# Patient Record
Sex: Female | Born: 1935 | Race: White | Hispanic: No | Marital: Single | Smoking: Never smoker
Health system: Southern US, Community
[De-identification: ages and names within clinical notes are randomized; demographics above are authoritative.]

## PROBLEM LIST (undated history)

## (undated) DIAGNOSIS — E039 Hypothyroidism, unspecified: Secondary | ICD-10-CM

## (undated) DIAGNOSIS — R269 Unspecified abnormalities of gait and mobility: Secondary | ICD-10-CM

## (undated) DIAGNOSIS — F341 Dysthymic disorder: Secondary | ICD-10-CM

## (undated) DIAGNOSIS — S72143A Displaced intertrochanteric fracture of unspecified femur, initial encounter for closed fracture: Secondary | ICD-10-CM

## (undated) DIAGNOSIS — I447 Left bundle-branch block, unspecified: Secondary | ICD-10-CM

## (undated) DIAGNOSIS — I10 Essential (primary) hypertension: Principal | ICD-10-CM

## (undated) DIAGNOSIS — N390 Urinary tract infection, site not specified: Secondary | ICD-10-CM

## (undated) DIAGNOSIS — F1010 Alcohol abuse, uncomplicated: Secondary | ICD-10-CM

## (undated) DIAGNOSIS — M1710 Unilateral primary osteoarthritis, unspecified knee: Secondary | ICD-10-CM

## (undated) DIAGNOSIS — E785 Hyperlipidemia, unspecified: Secondary | ICD-10-CM

## (undated) DIAGNOSIS — E079 Disorder of thyroid, unspecified: Secondary | ICD-10-CM

## (undated) DIAGNOSIS — D62 Acute posthemorrhagic anemia: Secondary | ICD-10-CM

## (undated) DIAGNOSIS — M810 Age-related osteoporosis without current pathological fracture: Secondary | ICD-10-CM

## (undated) DIAGNOSIS — I509 Heart failure, unspecified: Secondary | ICD-10-CM

## (undated) DIAGNOSIS — K829 Disease of gallbladder, unspecified: Secondary | ICD-10-CM

## (undated) HISTORY — DX: Acute posthemorrhagic anemia: D62

## (undated) HISTORY — DX: Heart failure, unspecified: I50.9

## (undated) HISTORY — DX: Unilateral primary osteoarthritis, unspecified knee: M17.10

## (undated) HISTORY — DX: Unspecified abnormalities of gait and mobility: R26.9

## (undated) HISTORY — DX: Hypothyroidism, unspecified: E03.9

## (undated) HISTORY — PX: CHOLECYSTECTOMY: SHX55

## (undated) HISTORY — DX: Urinary tract infection, site not specified: N39.0

## (undated) HISTORY — DX: Alcohol abuse, uncomplicated: F10.10

## (undated) HISTORY — DX: Dysthymic disorder: F34.1

## (undated) HISTORY — DX: Left bundle-branch block, unspecified: I44.7

## (undated) HISTORY — DX: Age-related osteoporosis without current pathological fracture: M81.0

## (undated) HISTORY — DX: Disease of gallbladder, unspecified: K82.9

## (undated) HISTORY — DX: Essential (primary) hypertension: I10

## (undated) HISTORY — DX: Displaced intertrochanteric fracture of unspecified femur, initial encounter for closed fracture: S72.143A

---

## 1997-05-09 ENCOUNTER — Encounter: Admission: RE | Admit: 1997-05-09 | Discharge: 1997-05-09 | Payer: Self-pay | Admitting: Internal Medicine

## 1997-05-24 ENCOUNTER — Encounter: Admission: RE | Admit: 1997-05-24 | Discharge: 1997-05-24 | Payer: Self-pay | Admitting: Hematology and Oncology

## 1997-05-30 ENCOUNTER — Encounter: Admission: RE | Admit: 1997-05-30 | Discharge: 1997-05-30 | Payer: Self-pay | Admitting: Internal Medicine

## 1997-06-02 ENCOUNTER — Encounter: Admission: RE | Admit: 1997-06-02 | Discharge: 1997-06-02 | Payer: Self-pay | Admitting: Internal Medicine

## 1998-06-05 ENCOUNTER — Encounter: Admission: RE | Admit: 1998-06-05 | Discharge: 1998-06-05 | Payer: Self-pay | Admitting: Internal Medicine

## 1999-03-29 ENCOUNTER — Encounter (INDEPENDENT_AMBULATORY_CARE_PROVIDER_SITE_OTHER): Payer: Self-pay

## 1999-03-29 ENCOUNTER — Encounter: Payer: Self-pay | Admitting: General Surgery

## 1999-03-29 ENCOUNTER — Inpatient Hospital Stay (HOSPITAL_COMMUNITY): Admission: EM | Admit: 1999-03-29 | Discharge: 1999-04-02 | Payer: Self-pay

## 1999-03-30 ENCOUNTER — Encounter: Payer: Self-pay | Admitting: General Surgery

## 1999-04-01 ENCOUNTER — Encounter: Payer: Self-pay | Admitting: General Surgery

## 1999-07-16 ENCOUNTER — Encounter: Admission: RE | Admit: 1999-07-16 | Discharge: 1999-07-16 | Payer: Self-pay | Admitting: Internal Medicine

## 1999-11-19 ENCOUNTER — Encounter: Admission: RE | Admit: 1999-11-19 | Discharge: 1999-11-19 | Payer: Self-pay | Admitting: Hematology and Oncology

## 2000-02-25 ENCOUNTER — Encounter: Admission: RE | Admit: 2000-02-25 | Discharge: 2000-02-25 | Payer: Self-pay | Admitting: Internal Medicine

## 2000-03-10 ENCOUNTER — Ambulatory Visit (HOSPITAL_COMMUNITY): Admission: RE | Admit: 2000-03-10 | Discharge: 2000-03-10 | Payer: Self-pay | Admitting: Internal Medicine

## 2000-03-10 ENCOUNTER — Encounter: Admission: RE | Admit: 2000-03-10 | Discharge: 2000-03-10 | Payer: Self-pay | Admitting: Internal Medicine

## 2000-04-24 ENCOUNTER — Encounter: Payer: Self-pay | Admitting: Sports Medicine

## 2000-04-24 ENCOUNTER — Encounter: Admission: RE | Admit: 2000-04-24 | Discharge: 2000-04-24 | Payer: Self-pay | Admitting: Sports Medicine

## 2000-06-17 ENCOUNTER — Ambulatory Visit (HOSPITAL_BASED_OUTPATIENT_CLINIC_OR_DEPARTMENT_OTHER): Admission: RE | Admit: 2000-06-17 | Discharge: 2000-06-17 | Payer: Self-pay | Admitting: General Surgery

## 2000-08-27 ENCOUNTER — Encounter: Admission: RE | Admit: 2000-08-27 | Discharge: 2000-08-27 | Payer: Self-pay | Admitting: Internal Medicine

## 2000-12-02 ENCOUNTER — Other Ambulatory Visit: Admission: RE | Admit: 2000-12-02 | Discharge: 2000-12-02 | Payer: Self-pay | Admitting: Internal Medicine

## 2000-12-07 ENCOUNTER — Encounter: Admission: RE | Admit: 2000-12-07 | Discharge: 2000-12-07 | Payer: Self-pay | Admitting: Internal Medicine

## 2000-12-07 ENCOUNTER — Encounter: Payer: Self-pay | Admitting: Internal Medicine

## 2001-05-31 ENCOUNTER — Encounter: Admission: RE | Admit: 2001-05-31 | Discharge: 2001-05-31 | Payer: Self-pay

## 2001-11-19 ENCOUNTER — Encounter: Admission: RE | Admit: 2001-11-19 | Discharge: 2001-11-19 | Payer: Self-pay | Admitting: Internal Medicine

## 2002-03-30 ENCOUNTER — Encounter: Admission: RE | Admit: 2002-03-30 | Discharge: 2002-03-30 | Payer: Self-pay | Admitting: Internal Medicine

## 2002-04-25 ENCOUNTER — Encounter: Payer: Self-pay | Admitting: Sports Medicine

## 2002-04-25 ENCOUNTER — Encounter: Admission: RE | Admit: 2002-04-25 | Discharge: 2002-04-25 | Payer: Self-pay | Admitting: Sports Medicine

## 2002-05-17 ENCOUNTER — Encounter: Admission: RE | Admit: 2002-05-17 | Discharge: 2002-05-17 | Payer: Self-pay | Admitting: Infectious Diseases

## 2002-06-27 ENCOUNTER — Emergency Department (HOSPITAL_COMMUNITY): Admission: EM | Admit: 2002-06-27 | Discharge: 2002-06-27 | Payer: Self-pay | Admitting: Emergency Medicine

## 2003-05-11 ENCOUNTER — Encounter: Admission: RE | Admit: 2003-05-11 | Discharge: 2003-05-11 | Payer: Self-pay | Admitting: *Deleted

## 2005-05-05 ENCOUNTER — Encounter: Admission: RE | Admit: 2005-05-05 | Discharge: 2005-05-05 | Payer: Self-pay | Admitting: Family Medicine

## 2006-06-18 ENCOUNTER — Encounter: Admission: RE | Admit: 2006-06-18 | Discharge: 2006-06-18 | Payer: Self-pay | Admitting: Gastroenterology

## 2006-12-03 ENCOUNTER — Other Ambulatory Visit: Admission: RE | Admit: 2006-12-03 | Discharge: 2006-12-03 | Payer: Self-pay | Admitting: Obstetrics & Gynecology

## 2008-02-01 ENCOUNTER — Encounter: Admission: RE | Admit: 2008-02-01 | Discharge: 2008-02-01 | Payer: Self-pay | Admitting: Family Medicine

## 2008-04-16 ENCOUNTER — Inpatient Hospital Stay (HOSPITAL_COMMUNITY): Admission: EM | Admit: 2008-04-16 | Discharge: 2008-04-21 | Payer: Self-pay | Admitting: Emergency Medicine

## 2008-04-21 ENCOUNTER — Encounter: Payer: Self-pay | Admitting: Family Medicine

## 2008-04-21 DIAGNOSIS — I10 Essential (primary) hypertension: Secondary | ICD-10-CM

## 2008-04-21 DIAGNOSIS — R109 Unspecified abdominal pain: Secondary | ICD-10-CM

## 2008-04-21 DIAGNOSIS — E039 Hypothyroidism, unspecified: Secondary | ICD-10-CM

## 2008-04-21 DIAGNOSIS — M810 Age-related osteoporosis without current pathological fracture: Secondary | ICD-10-CM

## 2008-04-21 DIAGNOSIS — R630 Anorexia: Secondary | ICD-10-CM | POA: Insufficient documentation

## 2008-04-21 DIAGNOSIS — IMO0002 Reserved for concepts with insufficient information to code with codable children: Secondary | ICD-10-CM

## 2008-04-21 DIAGNOSIS — R269 Unspecified abnormalities of gait and mobility: Secondary | ICD-10-CM

## 2008-04-21 DIAGNOSIS — N390 Urinary tract infection, site not specified: Secondary | ICD-10-CM | POA: Insufficient documentation

## 2008-04-21 DIAGNOSIS — S72143A Displaced intertrochanteric fracture of unspecified femur, initial encounter for closed fracture: Secondary | ICD-10-CM

## 2008-04-21 DIAGNOSIS — F1010 Alcohol abuse, uncomplicated: Secondary | ICD-10-CM | POA: Insufficient documentation

## 2008-04-21 DIAGNOSIS — D62 Acute posthemorrhagic anemia: Secondary | ICD-10-CM

## 2008-04-21 DIAGNOSIS — F329 Major depressive disorder, single episode, unspecified: Secondary | ICD-10-CM

## 2008-04-21 DIAGNOSIS — F32A Depression, unspecified: Secondary | ICD-10-CM | POA: Insufficient documentation

## 2008-04-21 DIAGNOSIS — F341 Dysthymic disorder: Secondary | ICD-10-CM

## 2008-04-21 DIAGNOSIS — M1710 Unilateral primary osteoarthritis, unspecified knee: Secondary | ICD-10-CM

## 2008-04-21 HISTORY — DX: Reserved for concepts with insufficient information to code with codable children: IMO0002

## 2008-04-21 HISTORY — DX: Urinary tract infection, site not specified: N39.0

## 2008-04-21 HISTORY — DX: Dysthymic disorder: F34.1

## 2008-04-21 HISTORY — DX: Displaced intertrochanteric fracture of unspecified femur, initial encounter for closed fracture: S72.143A

## 2008-04-21 HISTORY — DX: Age-related osteoporosis without current pathological fracture: M81.0

## 2008-04-21 HISTORY — DX: Acute posthemorrhagic anemia: D62

## 2008-04-21 HISTORY — DX: Unspecified abnormalities of gait and mobility: R26.9

## 2008-04-21 HISTORY — DX: Essential (primary) hypertension: I10

## 2008-04-21 HISTORY — DX: Hypothyroidism, unspecified: E03.9

## 2008-04-21 HISTORY — DX: Alcohol abuse, uncomplicated: F10.10

## 2008-05-08 ENCOUNTER — Encounter: Payer: Self-pay | Admitting: Family Medicine

## 2008-05-22 DIAGNOSIS — K829 Disease of gallbladder, unspecified: Secondary | ICD-10-CM

## 2008-05-22 DIAGNOSIS — I10 Essential (primary) hypertension: Secondary | ICD-10-CM | POA: Insufficient documentation

## 2008-05-22 HISTORY — DX: Disease of gallbladder, unspecified: K82.9

## 2008-05-23 ENCOUNTER — Encounter: Payer: Self-pay | Admitting: Cardiovascular Disease

## 2008-05-23 ENCOUNTER — Ambulatory Visit: Payer: Self-pay | Admitting: Cardiovascular Disease

## 2008-05-23 DIAGNOSIS — R609 Edema, unspecified: Secondary | ICD-10-CM

## 2008-05-23 DIAGNOSIS — I447 Left bundle-branch block, unspecified: Secondary | ICD-10-CM

## 2008-05-23 HISTORY — DX: Left bundle-branch block, unspecified: I44.7

## 2008-06-20 ENCOUNTER — Encounter: Payer: Self-pay | Admitting: Cardiovascular Disease

## 2008-06-20 ENCOUNTER — Ambulatory Visit: Payer: Self-pay

## 2008-06-28 ENCOUNTER — Encounter (INDEPENDENT_AMBULATORY_CARE_PROVIDER_SITE_OTHER): Payer: Self-pay | Admitting: *Deleted

## 2010-05-16 LAB — URINALYSIS, ROUTINE W REFLEX MICROSCOPIC
Bilirubin Urine: NEGATIVE
Glucose, UA: NEGATIVE mg/dL
Specific Gravity, Urine: 1.012 (ref 1.005–1.030)
Urobilinogen, UA: 0.2 mg/dL (ref 0.0–1.0)

## 2010-05-16 LAB — CBC
HCT: 25.1 % — ABNORMAL LOW (ref 36.0–46.0)
HCT: 25.7 % — ABNORMAL LOW (ref 36.0–46.0)
Hemoglobin: 10.8 g/dL — ABNORMAL LOW (ref 12.0–15.0)
Hemoglobin: 9.1 g/dL — ABNORMAL LOW (ref 12.0–15.0)
Hemoglobin: 12.9 g/dL (ref 12.0–15.0)
MCHC: 34.7 g/dL (ref 30.0–36.0)
MCHC: 35.8 g/dL (ref 30.0–36.0)
MCV: 94.3 fL (ref 78.0–100.0)
MCV: 95.2 fL (ref 78.0–100.0)
Platelets: 127 10*3/uL — ABNORMAL LOW (ref 150–400)
Platelets: 144 10*3/uL — ABNORMAL LOW (ref 150–400)
RBC: 2.71 MIL/uL — ABNORMAL LOW (ref 3.87–5.11)
RBC: 3.67 MIL/uL — ABNORMAL LOW (ref 3.87–5.11)
RDW: 13.8 % (ref 11.5–15.5)
RDW: 13.3 % (ref 11.5–15.5)
RDW: 13.5 % (ref 11.5–15.5)
WBC: 6.3 10*3/uL (ref 4.0–10.5)
WBC: 10 10*3/uL (ref 4.0–10.5)
WBC: 7.1 10*3/uL (ref 4.0–10.5)

## 2010-05-16 LAB — BASIC METABOLIC PANEL
BUN: 7 mg/dL (ref 6–23)
BUN: 5 mg/dL — ABNORMAL LOW (ref 6–23)
CO2: 31 mEq/L (ref 19–32)
CO2: 27 mEq/L (ref 19–32)
Calcium: 7.8 mg/dL — ABNORMAL LOW (ref 8.4–10.5)
Calcium: 7.8 mg/dL — ABNORMAL LOW (ref 8.4–10.5)
Calcium: 8.1 mg/dL — ABNORMAL LOW (ref 8.4–10.5)
Calcium: 8.3 mg/dL — ABNORMAL LOW (ref 8.4–10.5)
Creatinine, Ser: 0.63 mg/dL (ref 0.4–1.2)
GFR calc Af Amer: >60 mL/min (ref >60)
GFR calc Af Amer: >60 mL/min (ref >60)
GFR calc Af Amer: >60 mL/min (ref >60)
GFR calc non Af Amer: >60 mL/min (ref >60)
GFR calc non Af Amer: >60 mL/min (ref >60)
GFR calc non Af Amer: >60 mL/min (ref >60)
Glucose, Bld: 116 mg/dL — ABNORMAL HIGH (ref 70–99)
Sodium: 136 mEq/L (ref 135–145)
Sodium: 134 mEq/L — ABNORMAL LOW (ref 135–145)

## 2010-05-16 LAB — CROSSMATCH

## 2010-05-16 LAB — POCT I-STAT, CHEM 8
BUN: 5 mg/dL — ABNORMAL LOW (ref 6–23)
Chloride: 104 mEq/L (ref 96–112)
HCT: 37 % (ref 36.0–46.0)
TCO2: 23 mmol/L (ref 0–100)

## 2010-05-16 LAB — URINE MICROSCOPIC-ADD ON

## 2010-06-18 NOTE — H&P (Signed)
NAME:  JERE, VANBUREN NO.:  [id number]   MEDICAL RECORD NO.:  [id number]          PATIENT TYPE:  INP   LOCATION:  5019                         FACILITY:  [facility]   PHYSICIAN:  Eulas Post, MD    DATE OF BIRTH:  12/15/1923   DATE OF ADMISSION:  04/16/2008  DATE OF DISCHARGE:                              HISTORY & PHYSICAL   CHIEF COMPLAINT:  Left hip pain.   HISTORY:  Ms. Elizabeth [name] is an 77 year old woman who got up out of  her chair today and fell, and had acute onset left hip pain.  She rated  the pain as severe.  It is located directly over the left hip.  She had  no loss of consciousness or other associated injuries.  She was unable  to walk and was brought to the emergency room for evaluation.  Pain  medications make it better and activity makes it worse.   PAST MEDICAL HISTORY:  Significant for thyroid problems as well as  gallbladder disease and hypertension.   PAST SURGICAL HISTORY:  Significant for a cholecystectomy.   FAMILY HISTORY:  Negative for any hip fractures, but positive for a  sister with diabetes.   SOCIAL HISTORY:  She lives with her nephew and is a nonsmoker.   REVIEW OF SYSTEMS:  Complete review of systems was performed and was  negative for all review of systems except those noted in the history of  present illness.   PHYSICAL EXAMINATION:  CONSTITUTIONAL:  She is lying on a gurney and is  in no acute distress.  She is well developed, well nourished.  HEENT:  Her extraocular movements are intact.  LYMPHATIC:  She has no cervical or axillary lymphadenopathy.  CARDIOVASCULAR:  She has no significant pedal edema and her calves are  soft.  She has good perfusion throughout.  RESPIRATORY:  She has no increase in respiratory efforts and no  cyanosis.  PSYCHIATRIC:  Her judgment and insight are intact.  She is appropriate  through the course of our interaction and understands our discussion.  SKIN:  She has no skin lesions over  her left hip and her injury is  closed.  She has no breakdown of skin on her heels.  NEUROLOGIC:  Sensation is intact throughout both lower extremities.  MUSCULOSKELETAL:  Her right hip has no pain with range of motion  actively or passively.  The left hip has pain to palpation over the  greater trochanter and she is unable to do a straight leg raise.  Her  EHL and FHL are firing.  She is nontender throughout both upper  extremities.   LABORATORY DATA:  Her hemoglobin is 12 and her white count is 10.  She  has a chemistry that is essentially normal with the exception of a  slight elevated blood sugar.  Her urinalysis demonstrates positive  nitrites and bacteria on Gram stain.   X-rays:  AP pelvis demonstrates a displaced left intertrochanteric hip  fracture.  Her chest x-ray has a basilar consolidation that the  radiologist has indicated as consistent with atelectasis, and should  have  further followup.   IMPRESSION:  1. Left intertrochanteric hip fracture.  2. Asymptomatic urinary tract infection.  3. Probable atelectasis of lung.   PLAN:  She is going to need a left hip intertrochanteric stabilization.  She will be admitted to the hospital tonight and given pain medications  and Buck's traction.  We will plan to treat her urinary tract infection  with ciprofloxacin.  We will likely get followup x-rays of her lungs at  some point in the future to ensure resolution of the consolidation.  There is no current active evidence for pneumonia.  We will plan to make  her n.p.o. after breakfast tomorrow and proceed with surgery tomorrow  evening.  The risks, benefits, and alternatives have been discussed with  the patient and the family, and we will plan to proceed accordingly.      Eulas Post, MD  Electronically Signed     JPL/MEDQ  D:  04/16/2008  T:  04/17/2008  Job:  [id number]

## 2010-06-18 NOTE — Discharge Summary (Signed)
NAME:  Elizabeth [name], Elizabeth [name] NO.:  [id number]   MEDICAL RECORD NO.:  [id number]          PATIENT TYPE:  INP   LOCATION:  5019                         FACILITY:  [facility]   PHYSICIAN:  Vanita Panda. Magnus Ivan, M.D.DATE OF BIRTH:  Mar 23, 1923   DATE OF ADMISSION:  04/16/2008  DATE OF DISCHARGE:  04/21/2008                               DISCHARGE SUMMARY   ADMITTING DIAGNOSIS:  Left intertrochanteric hip/femur fracture.   SECONDARY DIAGNOSES:  1. High blood pressure.  2. Thyroid disease.   DISCHARGE DIAGNOSIS:  Status post open reduction and internal fixation  of left intertrochanteric hip fracture.   PROCEDURES:  Open reduction and internal fixation of left  intertrochanteric hip/femur fracture on April 17, 2008.   HOSPITAL COURSE:  Briefly, Elizabeth [name] is an 77 year old female who lives  with her son and nephew at home.  She had a mechanical fall at home on  the day of admission when she had an accidental fall.  She denied any  syncopal episode and had no evidence of head injury.  She had inability  to ambulate on her left leg and was brought to the [facility]  Room.  She was found to have a displaced intertrochanteric femur  fracture.  It was recommend that she undergo fixation of this fracture  in order to increase her mobilizing as much as possible.  She was taken  to the operating room the day after admission where she underwent open  reduction and internal fixation of the left hip fracture using a  cephalomedullary hip screw.  She tolerated the surgery well.  For  detailed description of the operation, please refer to the detailed  operative note from the patient's medical record.  Postoperatively, she  was returned to orthopedic floor bed and began working slowly with  physical therapy and occupational therapy on activities of daily living  and weightbearing as tolerated.  By the day of discharge, it was felt  that she would benefit from further  convalescence of the skilled nursing  facility to continue rehabilitation because she was not back to strength  yet and was still requiring some assistance with mobility.  Our goal was  to get her back to her community ambulatory status.  She was tolerating  her oral diet as well as her oral pain medications and was felt from  medical standpoint that she was able and could be discharged safely to  the Skilled Nursing Facility for further convalescence and  rehabilitation.   DISPOSITION:  To Skilled Nursing Facility.   DISCHARGE INSTRUCTIONS:  While she is at the Skilled Nursing Facility,  she should continue to work with therapy with increasing her mobility  with weightbearing as tolerated on her left hip using only assisted  devices and assistance from therapist.  Dry dressing can be changed  daily as needed on her left hip and her hip incision can get wet in the  shower.  A followup appointment should be established with Dr.  Eliberto Ivory office, [facility], area code [phone] and 2-3  weeks after discharge from the hospital.   DISCHARGE MEDICATIONS:  1. Hydrochlorothiazide 25 mg p.o. daily  2. Klor-Con 10 mEq  p.o. daily.  3. Alprazolam 0.25 mg p.o. daily.  4. Fluoxetine 10 mg p.o. daily.  5. Levothyroxin 50 mcg p.o. daily.  6. Clonidine 0.1 mg p.o. daily.  7. Vitamin D 1 tablet p.o. q. weekly.  8. Robaxin 500 mg p.o. q.6 h. p.r.n. spasms and pain.  9. Vicodin 5/100 one to two p.o. q.4-6 hours p.r.n. pain .      Vanita Panda. Magnus Ivan, M.D.  Electronically Signed     CYB/MEDQ  D:  04/20/2008  T:  04/21/2008  Job:  [id number]

## 2010-06-18 NOTE — Op Note (Signed)
NAME:  Elizabeth [name], BIR NO.:  [id number]   MEDICAL RECORD NO.:  [id number]          PATIENT TYPE:  INP   LOCATION:  5019                         FACILITY:  [facility]   PHYSICIAN:  Vanita Panda. Magnus Ivan, M.D.DATE OF BIRTH:  [birth year]   DATE OF PROCEDURE:  04/17/2008  DATE OF DISCHARGE:                               OPERATIVE REPORT   PREOPERATIVE DIAGNOSIS:  Left complex intertrochanteric femur/hip  fracture.   POSTOPERATIVE DIAGNOSIS:  Left complex intertrochanteric femur/hip  fracture.   PROCEDURE:  Open reduction and internal fixation of left  intertrochanteric hip fracture using cephalomedullary hip screw.   IMPLANTS:  [employer] 10 x 36 cephalomedullary nail with 95-mm lag  screw and 90-mm compression screw.   SURGEON:  Vanita Panda. Magnus Ivan, MD   ANESTHESIA:  General.   ANTIBIOTICS:  1 gram IV Ancef.   BLOOD LOSS:  200 mL.   COMPLICATIONS:  None.   INDICATIONS:  Briefly, Elizabeth [name] is an 77 year old community ambulator  who sustained a mechanical fall at home yesterday.  She was found to  have a left hip fracture after being brought to the emergency room at  [facility].  She was admitted to the Orthopedic Surgery Service, and I  talked to her nephew, who is her health care power of attorney, in  length as well as her and recommended she undergo fixation of the  fracture.  The risks, benefits, and alternatives were explained to her  in great detail, and she agreed to proceed surgery.   DESCRIPTION OF PROCEDURE:  After informed consent was obtained,  appropriate left hip was marked.  She was brought to the operating room,  placed supine on the operating fracture table.  General anesthesia was  then obtained.  Next, a peroneal post  was placed with appropriate  padding and her left leg was placed in-line skeletal traction using the  fracture table traction device.  Her right hip was abducted and flexed  and placed in a stirrup with appropriate  padding of peroneal nerve.  With the bed raised, the first fracture was assessed under direct  fluoroscopic guidance.  I was able to pull traction, internally rotate  the hip, and get into its anatomically reduced position as possible.  The hip was then prepped and draped with DuraPrep.  I then took a  measurement of the nail and was able to before making an incision place  a nail within its  box on top of the femur, and I chose a 10 x 36 mm  cephalomedullary nail from [facility].  This was then opened and  passed off to the back table.  The left hip was then prepped and draped  with DuraPrep and sterile drapes including a sterile shower curtain.  A  time out was called, and she was identified as the correct patient and  correct left hip.  I then made an incision along the lateral border of  the hip, three fingerbreadths proximal to the greater trochanter and  carried this down to the IT band, and I could easily palpate the tip of  the  greater trochanter which was highly comminuted .  Under direct  fluoroscopic guidance, a guide pin was then placed in an antegrade  fashion from the tip of the greater trochanter down the lesser  trochanter into the intermedullary canal.  Initiating  reamer was then  used to ream the proximal femur.  I then placed the 10 x 36 mm  cephalomedullary nail in an antegrade fashion down the leg and confirmed  its placement in the leg under direct fluoroscopic guidance.  I then  further tapped and placed with the mallet using the outrigger guide.  Once I felt this was in correct position, I used the outrigger guide  again and was able to make small incision along the lateral aspect of  the thigh.  A guide pin was then inserted, and it was near center  position from the traversing the fracture of the femoral neck into the  femoral head.  I then took a measurement of this and shows 95 mm lag  screw.  I drilled for the lag screw and then placed it without   difficulties.  Once this was in place, I locked the screw from the top  and was able to take the leg out of traction and placed a 19-mm  compression screw, compressing the fracture again under direct  fluoroscopic guidance.  I then put the hip through a  range of motion  and through lateral all the way back to AP on the C-arm, it appeared  that the fracture was reduced with the screw in the center position.  I  then copiously irrigated all wounds, closed the deep wound with #1  Vicryl suture followed by a 0 Vicryl suture to rectus fascia and  subcutaneous tissue and interrupted staples on the skin through these  two small incisions.  I then placed a Mepilex dressing.  The patient was  taken off the fracture table without complications, awakened, extubated,  taken to the recovery room in stable condition.      Vanita Panda. Magnus Ivan, M.D.  Electronically Signed     CYB/MEDQ  D:  04/17/2008  T:  04/18/2008  Job:  [id number]

## 2011-09-09 ENCOUNTER — Inpatient Hospital Stay (HOSPITAL_COMMUNITY): Payer: Medicare Other

## 2011-09-09 ENCOUNTER — Inpatient Hospital Stay (HOSPITAL_COMMUNITY): Payer: Medicare Other | Admitting: Anesthesiology

## 2011-09-09 ENCOUNTER — Encounter (HOSPITAL_COMMUNITY): Payer: Self-pay | Admitting: Emergency Medicine

## 2011-09-09 ENCOUNTER — Encounter (HOSPITAL_COMMUNITY)
Admission: EM | Disposition: A | Discharge status: Skilled Nursing Facility | Payer: Self-pay | Source: Home / Self Care | Attending: Orthopaedic Surgery

## 2011-09-09 ENCOUNTER — Emergency Department (HOSPITAL_COMMUNITY): Payer: Medicare Other

## 2011-09-09 ENCOUNTER — Inpatient Hospital Stay (HOSPITAL_COMMUNITY)
Admission: EM | Admit: 2011-09-09 | Discharge: 2011-09-12 | DRG: 481 | Disposition: A | Discharge status: Skilled Nursing Facility | Payer: Medicare Other | Attending: Orthopaedic Surgery | Admitting: Orthopaedic Surgery

## 2011-09-09 ENCOUNTER — Encounter (HOSPITAL_COMMUNITY): Payer: Self-pay | Admitting: Anesthesiology

## 2011-09-09 DIAGNOSIS — R296 Repeated falls: Secondary | ICD-10-CM | POA: Diagnosis present

## 2011-09-09 DIAGNOSIS — X500XXA Overexertion from strenuous movement or load, initial encounter: Secondary | ICD-10-CM | POA: Diagnosis present

## 2011-09-09 DIAGNOSIS — I10 Essential (primary) hypertension: Secondary | ICD-10-CM | POA: Diagnosis present

## 2011-09-09 DIAGNOSIS — S72402A Unspecified fracture of lower end of left femur, initial encounter for closed fracture: Secondary | ICD-10-CM | POA: Diagnosis present

## 2011-09-09 DIAGNOSIS — S72333A Displaced oblique fracture of shaft of unspecified femur, initial encounter for closed fracture: Secondary | ICD-10-CM

## 2011-09-09 DIAGNOSIS — Y9301 Activity, walking, marching and hiking: Secondary | ICD-10-CM

## 2011-09-09 DIAGNOSIS — Z79899 Other long term (current) drug therapy: Secondary | ICD-10-CM

## 2011-09-09 DIAGNOSIS — S72309A Unspecified fracture of shaft of unspecified femur, initial encounter for closed fracture: Principal | ICD-10-CM | POA: Diagnosis present

## 2011-09-09 DIAGNOSIS — D62 Acute posthemorrhagic anemia: Secondary | ICD-10-CM | POA: Diagnosis not present

## 2011-09-09 DIAGNOSIS — Y92009 Unspecified place in unspecified non-institutional (private) residence as the place of occurrence of the external cause: Secondary | ICD-10-CM

## 2011-09-09 DIAGNOSIS — N390 Urinary tract infection, site not specified: Secondary | ICD-10-CM

## 2011-09-09 HISTORY — DX: Disorder of thyroid, unspecified: E07.9

## 2011-09-09 HISTORY — PX: FEMUR IM NAIL: SHX1597

## 2011-09-09 LAB — URINALYSIS, ROUTINE W REFLEX MICROSCOPIC
Bilirubin Urine: NEGATIVE
Glucose, UA: NEGATIVE mg/dL
Ketones, ur: NEGATIVE mg/dL
Leukocytes, UA: NEGATIVE
Nitrite: POSITIVE — AB
Protein, ur: NEGATIVE mg/dL
Specific Gravity, Urine: 1.009 (ref 1.005–1.030)
Urobilinogen, UA: 0.2 mg/dL (ref 0.0–1.0)
pH: 6.5 (ref 5.0–8.0)

## 2011-09-09 LAB — CBC
HCT: 36.9 % (ref 36.0–46.0)
Hemoglobin: 12.5 g/dL (ref 12.0–15.0)
MCH: 32.2 pg (ref 26.0–34.0)
MCHC: 33.9 g/dL (ref 30.0–36.0)
MCV: 95.1 fL (ref 78.0–100.0)
RBC: 3.88 MIL/uL (ref 3.87–5.11)

## 2011-09-09 LAB — BASIC METABOLIC PANEL
BUN: 11 mg/dL (ref 6–23)
CO2: 31 mEq/L (ref 19–32)
Calcium: 8.5 mg/dL (ref 8.4–10.5)
Calcium: 9.2 mg/dL (ref 8.4–10.5)
Creatinine, Ser: 0.59 mg/dL (ref 0.50–1.10)
GFR calc non Af Amer: 82 mL/min — ABNORMAL LOW (ref >90)
GFR calc non Af Amer: 81 mL/min — ABNORMAL LOW (ref >90)
Glucose, Bld: 115 mg/dL — ABNORMAL HIGH (ref 70–99)
Potassium: 3.9 mEq/L (ref 3.5–5.1)
Sodium: 138 mEq/L (ref 135–145)
Sodium: 139 mEq/L (ref 135–145)

## 2011-09-09 LAB — CBC WITH DIFFERENTIAL/PLATELET
Basophils Absolute: 0 10*3/uL (ref 0.0–0.1)
Eosinophils Absolute: 0.1 10*3/uL (ref 0.0–0.7)
Eosinophils Relative: 1 % (ref 0–5)
Lymphocytes Relative: 10 % — ABNORMAL LOW (ref 12–46)
MCV: 95.7 fL (ref 78.0–100.0)
Neutrophils Relative %: 82 % — ABNORMAL HIGH (ref 43–77)
Platelets: 142 10*3/uL — ABNORMAL LOW (ref 150–400)
RBC: 3.92 MIL/uL (ref 3.87–5.11)
RDW: 14.3 % (ref 11.5–15.5)
WBC: 6.3 10*3/uL (ref 4.0–10.5)

## 2011-09-09 LAB — URINE MICROSCOPIC-ADD ON

## 2011-09-09 SURGERY — INSERTION, INTRAMEDULLARY ROD, FEMUR
Anesthesia: General | Site: Hip | Laterality: Left | Wound class: Clean

## 2011-09-09 MED ORDER — ONDANSETRON HCL 4 MG/2ML IJ SOLN: 4.0000 mg | Freq: Four times a day (QID) | INTRAMUSCULAR | Status: DC | PRN

## 2011-09-09 MED ORDER — PROPOFOL 10 MG/ML IV BOLUS
INTRAVENOUS | Status: DC | PRN
  Administered 2011-09-09: 50 mg via INTRAVENOUS

## 2011-09-09 MED ORDER — SODIUM CHLORIDE 0.9 % IV SOLN
INTRAVENOUS | Status: DC
  Administered 2011-09-10: 04:00:00 via INTRAVENOUS

## 2011-09-09 MED ORDER — METOCLOPRAMIDE HCL 5 MG/ML IJ SOLN
5.0000 mg | Freq: Three times a day (TID) | INTRAMUSCULAR | Status: DC | PRN
  Administered 2011-09-10 – 2011-09-11 (×2): 10 mg via INTRAVENOUS
  Filled 2011-09-09 (×2): qty 2

## 2011-09-09 MED ORDER — EPHEDRINE SULFATE 50 MG/ML IJ SOLN
INTRAMUSCULAR | Status: DC | PRN
  Administered 2011-09-09 (×2): 10 mg via INTRAVENOUS

## 2011-09-09 MED ORDER — CLONIDINE HCL 0.1 MG PO TABS
0.1000 mg | ORAL_TABLET | Freq: Every day | ORAL | Status: DC
  Administered 2011-09-10 – 2011-09-12 (×3): 0.1 mg via ORAL
  Filled 2011-09-09 (×3): qty 1

## 2011-09-09 MED ORDER — OXYCODONE HCL 5 MG PO TABS: 5.0000 mg | ORAL_TABLET | ORAL | Status: DC | PRN

## 2011-09-09 MED ORDER — FUROSEMIDE 20 MG PO TABS
20.0000 mg | ORAL_TABLET | Freq: Every day | ORAL | Status: DC
  Administered 2011-09-10 – 2011-09-12 (×3): 20 mg via ORAL
  Filled 2011-09-09 (×3): qty 1

## 2011-09-09 MED ORDER — FENTANYL CITRATE 0.05 MG/ML IJ SOLN
50.0000 ug | Freq: Once | INTRAMUSCULAR | Status: AC
  Administered 2011-09-09: 50 ug via INTRAVENOUS
  Filled 2011-09-09: qty 2

## 2011-09-09 MED ORDER — LIDOCAINE HCL (CARDIAC) 20 MG/ML IV SOLN
INTRAVENOUS | Status: DC | PRN
  Administered 2011-09-09: 50 mg via INTRAVENOUS

## 2011-09-09 MED ORDER — HYDROMORPHONE HCL PF 1 MG/ML IJ SOLN: 0.2500 mg | INTRAMUSCULAR | Status: DC | PRN

## 2011-09-09 MED ORDER — SODIUM CHLORIDE 0.9 % IR SOLN
Status: DC | PRN
  Administered 2011-09-09: 1

## 2011-09-09 MED ORDER — LACTATED RINGERS IV SOLN
INTRAVENOUS | Status: DC | PRN
  Administered 2011-09-09 (×2): via INTRAVENOUS

## 2011-09-09 MED ORDER — MORPHINE SULFATE 4 MG/ML IJ SOLN
0.5000 mg | INTRAMUSCULAR | Status: DC | PRN
  Administered 2011-09-09 (×2): 0.52 mg via INTRAVENOUS
  Filled 2011-09-09 (×2): qty 1

## 2011-09-09 MED ORDER — METHOCARBAMOL 100 MG/ML IJ SOLN
500.0000 mg | Freq: Four times a day (QID) | INTRAVENOUS | Status: DC | PRN
  Filled 2011-09-09: qty 5

## 2011-09-09 MED ORDER — CEFAZOLIN SODIUM-DEXTROSE 2-3 GM-% IV SOLR
2.0000 g | INTRAVENOUS | Status: AC
  Administered 2011-09-09: 2 g via INTRAVENOUS
  Filled 2011-09-09: qty 50

## 2011-09-09 MED ORDER — ONDANSETRON HCL 4 MG/2ML IJ SOLN
4.0000 mg | Freq: Once | INTRAMUSCULAR | Status: AC
  Administered 2011-09-09: 4 mg via INTRAVENOUS
  Filled 2011-09-09: qty 2

## 2011-09-09 MED ORDER — DIPHENHYDRAMINE HCL 12.5 MG/5ML PO ELIX: 12.5000 mg | ORAL_SOLUTION | ORAL | Status: DC | PRN

## 2011-09-09 MED ORDER — HYDROMORPHONE HCL PF 1 MG/ML IJ SOLN
0.5000 mg | INTRAMUSCULAR | Status: DC | PRN
  Filled 2011-09-09: qty 1

## 2011-09-09 MED ORDER — FENTANYL CITRATE 0.05 MG/ML IJ SOLN
INTRAMUSCULAR | Status: DC | PRN
  Administered 2011-09-09: 25 ug via INTRAVENOUS
  Administered 2011-09-09: 125 ug via INTRAVENOUS
  Administered 2011-09-09: 25 ug via INTRAVENOUS

## 2011-09-09 MED ORDER — METHOCARBAMOL 500 MG PO TABS
500.0000 mg | ORAL_TABLET | Freq: Four times a day (QID) | ORAL | Status: DC | PRN
  Administered 2011-09-11: 500 mg via ORAL
  Filled 2011-09-09: qty 1

## 2011-09-09 MED ORDER — CEFAZOLIN SODIUM 1-5 GM-% IV SOLN
1.0000 g | Freq: Four times a day (QID) | INTRAVENOUS | Status: AC
  Administered 2011-09-10 (×3): 1 g via INTRAVENOUS
  Filled 2011-09-09 (×3): qty 50

## 2011-09-09 MED ORDER — METOCLOPRAMIDE HCL 10 MG PO TABS: 5.0000 mg | ORAL_TABLET | Freq: Three times a day (TID) | ORAL | Status: DC | PRN

## 2011-09-09 MED ORDER — ONDANSETRON HCL 4 MG/2ML IJ SOLN: 4.0000 mg | Freq: Once | INTRAMUSCULAR | Status: DC | PRN

## 2011-09-09 MED ORDER — ONDANSETRON HCL 4 MG PO TABS
4.0000 mg | ORAL_TABLET | Freq: Four times a day (QID) | ORAL | Status: DC | PRN
  Administered 2011-09-11: 4 mg via ORAL
  Filled 2011-09-09: qty 1

## 2011-09-09 MED ORDER — MORPHINE SULFATE 2 MG/ML IJ SOLN
1.0000 mg | INTRAMUSCULAR | Status: DC | PRN
  Administered 2011-09-10 (×2): 1 mg via INTRAVENOUS
  Filled 2011-09-09 (×2): qty 1

## 2011-09-09 MED ORDER — ZOLPIDEM TARTRATE 5 MG PO TABS: 5.0000 mg | ORAL_TABLET | Freq: Every evening | ORAL | Status: DC | PRN

## 2011-09-09 MED ORDER — HYDROCODONE-ACETAMINOPHEN 5-325 MG PO TABS
1.0000 | ORAL_TABLET | ORAL | Status: DC | PRN
  Administered 2011-09-10: 1 via ORAL
  Administered 2011-09-10 (×2): 2 via ORAL
  Administered 2011-09-11 (×3): 1 via ORAL
  Administered 2011-09-12: 2 via ORAL
  Filled 2011-09-09: qty 1
  Filled 2011-09-09: qty 2
  Filled 2011-09-09: qty 1
  Filled 2011-09-09: qty 2
  Filled 2011-09-09 (×2): qty 1
  Filled 2011-09-09: qty 2

## 2011-09-09 MED ORDER — POTASSIUM CHLORIDE 2 MEQ/ML IV SOLN
INTRAVENOUS | Status: DC
  Administered 2011-09-09: 15:00:00 via INTRAVENOUS
  Filled 2011-09-09 (×2): qty 1000

## 2011-09-09 MED ORDER — POTASSIUM CHLORIDE CRYS ER 10 MEQ PO TBCR
10.0000 meq | EXTENDED_RELEASE_TABLET | Freq: Every day | ORAL | Status: DC
  Administered 2011-09-10 – 2011-09-12 (×3): 10 meq via ORAL
  Filled 2011-09-09 (×3): qty 1

## 2011-09-09 SURGICAL SUPPLY — 47 items
3.5 X 60mm ×2 IMPLANT
BANDAGE GAUZE ELAST BULKY 4 IN (GAUZE/BANDAGES/DRESSINGS) ×2 IMPLANT
BIT DRILL 3.5 QC 155 (BIT) ×4 IMPLANT
BIT DRILL QC 2.7 6.3IN  SHORT (BIT) ×1
BIT DRILL QC 2.7 6.3IN SHORT (BIT) ×1 IMPLANT
BLADE SURG 15 STRL LF DISP TIS (BLADE) ×1 IMPLANT
BLADE SURG 15 STRL SS (BLADE) ×2
BNDG COHESIVE 4X5 TAN NS LF (GAUZE/BANDAGES/DRESSINGS) ×2 IMPLANT
CLOTH BEACON ORANGE TIMEOUT ST (SAFETY) ×2 IMPLANT
COVER MAYO STAND STRL (DRAPES) ×4 IMPLANT
COVER SURGICAL LIGHT HANDLE (MISCELLANEOUS) ×2 IMPLANT
DRAPE PROXIMA HALF (DRAPES) ×2 IMPLANT
DRAPE STERI IOBAN 125X83 (DRAPES) ×2 IMPLANT
DRSG MEPILEX BORDER 4X4 (GAUZE/BANDAGES/DRESSINGS) ×2 IMPLANT
DRSG MEPILEX BORDER 4X8 (GAUZE/BANDAGES/DRESSINGS) IMPLANT
DRSG PAD ABDOMINAL 8X10 ST (GAUZE/BANDAGES/DRESSINGS) ×4 IMPLANT
DURAPREP 26ML APPLICATOR (WOUND CARE) ×2 IMPLANT
ELECT REM PT RETURN 9FT ADLT (ELECTROSURGICAL) ×2
ELECTRODE REM PT RTRN 9FT ADLT (ELECTROSURGICAL) ×1 IMPLANT
FACESHIELD LNG OPTICON STERILE (SAFETY) IMPLANT
GAUZE XEROFORM 5X9 LF (GAUZE/BANDAGES/DRESSINGS) ×2 IMPLANT
GLOVE BIOGEL PI IND STRL 8 (GLOVE) ×1 IMPLANT
GLOVE BIOGEL PI INDICATOR 8 (GLOVE) ×1
GLOVE ORTHO TXT STRL SZ7.5 (GLOVE) ×2 IMPLANT
GOWN PREVENTION PLUS LG XLONG (DISPOSABLE) IMPLANT
GOWN PREVENTION PLUS XLARGE (GOWN DISPOSABLE) ×2 IMPLANT
GOWN STRL NON-REIN LRG LVL3 (GOWN DISPOSABLE) ×2 IMPLANT
IMMOBILIZER KNEE 20 (SOFTGOODS) ×2
IMMOBILIZER KNEE 20 THIGH 36 (SOFTGOODS) ×1 IMPLANT
IMMOBILIZER KNEE 22 UNIV (SOFTGOODS) ×2 IMPLANT
KIT BASIN OR (CUSTOM PROCEDURE TRAY) ×2 IMPLANT
KIT ROOM TURNOVER OR (KITS) ×2 IMPLANT
MANIFOLD NEPTUNE II (INSTRUMENTS) ×2 IMPLANT
NS IRRIG 1000ML POUR BTL (IV SOLUTION) ×2 IMPLANT
PACK GENERAL/GYN (CUSTOM PROCEDURE TRAY) ×2 IMPLANT
PAD ARMBOARD 7.5X6 YLW CONV (MISCELLANEOUS) ×2 IMPLANT
PAD CAST 4YDX4 CTTN HI CHSV (CAST SUPPLIES) ×2 IMPLANT
PADDING CAST COTTON 4X4 STRL (CAST SUPPLIES) ×4
SCREW CAPTURED 4.4X32MM (Screw) ×4 IMPLANT
STAPLER VISISTAT 35W (STAPLE) ×2 IMPLANT
SUT VIC AB 0 CT1 27 (SUTURE) ×2
SUT VIC AB 0 CT1 27XBRD ANBCTR (SUTURE) ×1 IMPLANT
SUT VIC AB 2-0 CT1 27 (SUTURE) ×2
SUT VIC AB 2-0 CT1 TAPERPNT 27 (SUTURE) ×1 IMPLANT
TOWEL OR 17X24 6PK STRL BLUE (TOWEL DISPOSABLE) ×2 IMPLANT
TOWEL OR 17X26 10 PK STRL BLUE (TOWEL DISPOSABLE) ×2 IMPLANT
WATER STERILE IRR 1000ML POUR (IV SOLUTION) IMPLANT

## 2011-09-09 NOTE — ED Notes (Signed)
Pt was taken out of traction by ortho PA. States that she does not need traction at this time.

## 2011-09-09 NOTE — Progress Notes (Signed)
Elizabeth [name]. Elizabeth [name], 78 year old white female is in good spirits.  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  Patient thanked Orthoptist for providing pastoral presence, prayer, and conversation.  I will follow up as needed.

## 2011-09-09 NOTE — H&P (Signed)
  I spoke with the patient and her family at the bedside with her and her family.  They understand the need for surgery and the risks/benefits involved.

## 2011-09-09 NOTE — Transfer of Care (Signed)
Immediate Anesthesia Transfer of Care Note  Patient: Elizabeth [name]  Procedure(s) Performed: Procedure(s) (LRB): INTRAMEDULLARY (IM) NAIL FEMORAL (Left)  Patient Location: PACU  Anesthesia Type: General  Level of Consciousness: awake, alert  and oriented  Airway & Oxygen Therapy: Patient connected to nasal cannula oxygen  Post-op Assessment: Report given to PACU RN and Post -op Vital signs reviewed and stable  Post vital signs: Reviewed and stable  Complications: No apparent anesthesia complications

## 2011-09-09 NOTE — Anesthesia Preprocedure Evaluation (Signed)
Anesthesia Evaluation  Patient identified by MRN, date of birth, ID band Patient awake    Airway Mallampati: I TM Distance: >3 FB Neck ROM: Full    Dental  (+) Missing, Poor Dentition and Dental Advisory Given   Pulmonary  breath sounds clear to auscultation        Cardiovascular hypertension, Pt. on medications + dysrhythmias Rhythm:Regular Rate:Normal     Neuro/Psych    GI/Hepatic   Endo/Other    Renal/GU      Musculoskeletal   Abdominal   Peds  Hematology   Anesthesia Other Findings   Reproductive/Obstetrics                           Anesthesia Physical Anesthesia Plan  ASA: III  Anesthesia Plan: General   Post-op Pain Management:    Induction: Intravenous  Airway Management Planned: LMA  Additional Equipment:   Intra-op Plan:   Post-operative Plan: Extubation in OR  Informed Consent: I have reviewed the patients History and Physical, chart, labs and discussed the procedure including the risks, benefits and alternatives for the proposed anesthesia with the patient or authorized representative who has indicated his/her understanding and acceptance.   Dental advisory given  Plan Discussed with: CRNA, Anesthesiologist and Surgeon  Anesthesia Plan Comments:         Anesthesia Quick Evaluation

## 2011-09-09 NOTE — Anesthesia Procedure Notes (Signed)
Procedure Name: LMA Insertion Date/Time: 09/09/2011 9:23 PM Performed by: Molli Hazard Pre-anesthesia Checklist: Patient identified, Emergency Drugs available, Suction available and Patient being monitored Patient Re-evaluated:Patient Re-evaluated prior to inductionOxygen Delivery Method: Circle system utilized Preoxygenation: Pre-oxygenation with 100% oxygen Intubation Type: IV induction LMA: LMA inserted LMA Size: 4.0 Number of attempts: 1 Tube secured with: Tape Dental Injury: Teeth and Oropharynx as per pre-operative assessment

## 2011-09-09 NOTE — ED Provider Notes (Signed)
History     CSN: [id number]  Arrival date & time 09/09/11  4098   First MD Initiated Contact with Patient 09/09/11 [phone]      Chief Complaint  Patient presents with  . Fall  . Leg Pain    (Consider location/radiation/quality/duration/timing/severity/associated sxs/prior treatment) HPI History from patient. 78 year old female with past medical history of thyroid disease and hypertension presents with pain to her left leg after a mechanical fall this morning. She states "I was trying to turn around in the hall when I lost my balance and fell." She was unable to get up or ambulate. Currently c/o sharp, constant, severe pain to the L thigh, has noted swelling to the same.  She has pain with movement of the leg. EMS immobilized the leg in Hare traction splint and gave her Fentanyl en route.  Patient has a history of ORIF to the left hip in 2010 after an intertrochanteric fracture which was repaired with a screw and intermedullary nail (surgeon: Dr. Magnus Ivan).   Past Medical History  Diagnosis Date  . Thyroid disease   . Hypertension     History reviewed. No pertinent past surgical history.  History reviewed. No pertinent family history.  History  Substance Use Topics  . Smoking status: Not on file  . Smokeless tobacco: Not on file  . Alcohol Use:     OB History    Grav Para Term Preterm Abortions TAB SAB Ect Mult Living                  Review of Systems  Constitutional: Negative.   Respiratory: Negative for cough, chest tightness and shortness of breath.   Cardiovascular: Negative for chest pain.  Gastrointestinal: Negative for nausea, vomiting and abdominal pain.  Musculoskeletal: Positive for arthralgias.  Skin: Negative for wound.  Neurological: Negative for numbness.    Allergies  Review of patient's allergies indicates no known allergies.  Home Medications   Current Outpatient Rx  Name Route Sig Dispense Refill  . CARVEDILOL PO Oral Take 1 tablet by mouth 2  (two) times daily.    Marland Kitchen VITAMIN D PO Oral Take 1 tablet by mouth 2 (two) times daily.    Marland Kitchen VITAMIN B-12 PO Oral Take 1 tablet by mouth daily.    Marland Kitchen LASIX PO Oral Take 1 tablet by mouth daily.    Marland Kitchen POTASSIUM PO Oral Take 1 tablet by mouth daily.    Marland Kitchen PRESCRIPTION MEDICATION Oral Take 1 tablet by mouth daily. synthroid      BP 160/60  Pulse 61  Temp 98 F (36.7 C) (Oral)  Resp 16  SpO2 100%  Physical Exam  Nursing note and vitals reviewed. Constitutional: She appears well-developed and well-nourished. No distress.  HENT:  Head: Normocephalic and atraumatic.  Neck: Normal range of motion.  Cardiovascular: Normal rate, regular rhythm and normal heart sounds.   Pulmonary/Chest: Effort normal and breath sounds normal. She exhibits no tenderness.  Abdominal: Soft. Bowel sounds are normal. There is no tenderness. There is no rebound and no guarding.  Musculoskeletal:       Surgical scar c/w previous surgery to L hip. Pt with tenderness to palp and edema of mid thigh. Questionable shortnening. Pain with any movement of the leg. Pt currently immobilized in Hare traction splint. NVI distally.  Neurological: She is alert.  Skin: Skin is warm and dry. She is not diaphoretic.  Psychiatric: She has a normal mood and affect.    ED Course  Procedures (including critical  care time)  Labs Reviewed  CBC WITH DIFFERENTIAL - Abnormal; Notable for the following:    Platelets 142 (*)     Neutrophils Relative 82 (*)     Lymphocytes Relative 10 (*)     All other components within normal limits  BASIC METABOLIC PANEL - Abnormal; Notable for the following:    Glucose, Bld 164 (*)     GFR calc non Af Amer 82 (*)     All other components within normal limits  URINALYSIS, ROUTINE W REFLEX MICROSCOPIC - Abnormal; Notable for the following:    Color, Urine STRAW (*)     Hgb urine dipstick TRACE (*)     Nitrite POSITIVE (*)     All other components within normal limits  URINE MICROSCOPIC-ADD ON -  Abnormal; Notable for the following:    Bacteria, UA MANY (*)     All other components within normal limits   Dg Hip Complete Left  09/09/2011  *RADIOLOGY REPORT*  Clinical Data: Pain left femur  LEFT HIP - COMPLETE 2+ VIEW  Comparison: Plain films 04/16/2008, distal femur same day.  Findings: Internal fixation of the left femur.  The hips are located.  No evidence of fracture of the proximal left femur.  No evidence of pelvic fracture or sacral fracture.  Exam is limited by overlying metallic external fixator.  IMPRESSION:  No acute fracture of the proximal left femur or pelvis.  Fracture of the distal left femur on comparison exam.  Original Report Authenticated By: Genevive Bi, M.D.   Dg Femur Left  09/09/2011  *RADIOLOGY REPORT*  Clinical Data: Distal left femur fracture  LEFT FEMUR - 2 VIEW  Comparison: None.  Findings: There is an oblique fracture through the distal left femoral diaphysis.  Internal medullary nail is noted which extends into the metaphysis.  The knee joint appears intact. Extensive vascular calcification noted.  IMPRESSION: Oblique fracture of the distal left femur metaphysis.  Original Report Authenticated By: Genevive Bi, M.D.     1. Displaced oblique fracture of shaft of femur   2. Urinary tract infection       MDM  10:44 AM Xrays reviewed by me, show oblique fx through distal L femoral diaphysis above distal end of IM nail. Discussed with Dr. Magnus Ivan. He will see the patient and accepts her for admission. Recommends putting her in Buck's traction while awaiting his assessment.  Grant Fontana, PA-C 09/09/11 1052

## 2011-09-09 NOTE — ED Notes (Signed)
PT. IS UNDRESS IN [place]  ON THE MONITOR.

## 2011-09-09 NOTE — H&P (Addendum)
Elizabeth [name] is an 78 y.o. female.   Chief Complaint: left leg pain HPI: Pt reports she turned around at home and lost her footing and fell to the ground.  Nephew lives with her and called the EMS as she could not weight bear on the left leg secondary to pain. Pt is s/p IM nailing of intertrochanteric fracture of the left hip in March 2010 by Dr Magnus Ivan. She denies loss of consciousness or dizziness.  Now with pain in the left thigh only.  Radiographs with distal femur fracture on the left.  The previous IM nail utilized was a long nail and the distal interlock screw holes remain open. The current fracture is above the distal screw holes.  The fracture will require fixation by placing the pt in traction to get alignment of the fracture and utilization of the 2 distal interlock screw holes.   Pt agrees to progress.  Procedure posted to be done by DR Magnus Ivan this afternoon.  Pt admitted to hospital for treatment of fracture.   Past Medical History  Diagnosis Date  . Thyroid disease   . Hypertension   Diabetes is documented at last admission in 2010,but not currently being treated for DM  Past Surgical History  Procedure Date  . Cholecystectomy     History reviewed. No pertinent family history. Social History:  reports that she has never smoked. Her smokeless tobacco use includes Snuff. She reports that she drinks alcohol. She reports that she does not use illicit drugs.  Allergies: No Known Allergies  Medications Prior to Admission  Medication Sig Dispense Refill  . Cholecalciferol (VITAMIN D PO) Take 1 tablet by mouth 2 (two) times daily.      . cloNIDine (CATAPRES) 0.1 MG tablet Take 0.1 mg by mouth daily.      . Cyanocobalamin (VITAMIN B-12 PO) Take 1 tablet by mouth daily.      . furosemide (LASIX) 20 MG tablet Take 20 mg by mouth daily.      . Levothyroxine Sodium (SYNTHROID PO) Take 1 tablet by mouth daily.      . potassium chloride (K-DUR,KLOR-CON) 10 MEQ tablet Take 10 mEq by  mouth daily.        Results for orders placed during the hospital encounter of 09/09/11 (from the past 48 hour(s))  CBC WITH DIFFERENTIAL     Status: Abnormal   Collection Time   09/09/11  8:50 AM      Component Value Range Comment   WBC 6.3  4.0 - 10.5 K/uL    RBC 3.92  3.87 - 5.11 MIL/uL    Hemoglobin 12.7  12.0 - 15.0 g/dL    HCT 40.9  81.1 - 91.4 %    MCV 95.7  78.0 - 100.0 fL    MCH 32.4  26.0 - 34.0 pg    MCHC 33.9  30.0 - 36.0 g/dL    RDW 78.2  95.6 - 21.3 %    Platelets 142 (*) 150 - 400 K/uL    Neutrophils Relative 82 (*) 43 - 77 %    Neutro Abs 5.1  1.7 - 7.7 K/uL    Lymphocytes Relative 10 (*) 12 - 46 %    Lymphs Abs 0.7  0.7 - 4.0 K/uL    Monocytes Relative 6  3 - 12 %    Monocytes Absolute 0.4  0.1 - 1.0 K/uL    Eosinophils Relative 1  0 - 5 %    Eosinophils Absolute 0.1  0.0 -  0.7 K/uL    Basophils Relative 0  0 - 1 %    Basophils Absolute 0.0  0.0 - 0.1 K/uL   BASIC METABOLIC PANEL     Status: Abnormal   Collection Time   09/09/11  8:50 AM      Component Value Range Comment   Sodium 138  135 - 145 mEq/L    Potassium 3.9  3.5 - 5.1 mEq/L    Chloride 103  96 - 112 mEq/L    CO2 28  19 - 32 mEq/L    Glucose, Bld 164 (*) 70 - 99 mg/dL    BUN 12  6 - 23 mg/dL    Creatinine, Ser 1.61  0.50 - 1.10 mg/dL    Calcium 8.5  8.4 - 09.6 mg/dL    GFR calc non Af Amer 82 (*) >90 mL/min    GFR calc Af Amer >90  >90 mL/min   URINALYSIS, ROUTINE W REFLEX MICROSCOPIC     Status: Abnormal   Collection Time   09/09/11  9:19 AM      Component Value Range Comment   Color, Urine STRAW (*) YELLOW    APPearance CLEAR  CLEAR    Specific Gravity, Urine 1.009  1.005 - 1.030    pH 6.5  5.0 - 8.0    Glucose, UA NEGATIVE  NEGATIVE mg/dL    Hgb urine dipstick TRACE (*) NEGATIVE    Bilirubin Urine NEGATIVE  NEGATIVE    Ketones, ur NEGATIVE  NEGATIVE mg/dL    Protein, ur NEGATIVE  NEGATIVE mg/dL    Urobilinogen, UA 0.2  0.0 - 1.0 mg/dL    Nitrite POSITIVE (*) NEGATIVE    Leukocytes, UA  NEGATIVE  NEGATIVE   URINE MICROSCOPIC-ADD ON     Status: Abnormal   Collection Time   09/09/11  9:19 AM      Component Value Range Comment   Squamous Epithelial / LPF RARE  RARE    WBC, UA 0-2  <3 WBC/hpf    RBC / HPF 0-2  <3 RBC/hpf    Bacteria, UA MANY (*) RARE   CBC     Status: Abnormal   Collection Time   09/09/11  1:02 PM      Component Value Range Comment   WBC 11.1 (*) 4.0 - 10.5 K/uL    RBC 3.88  3.87 - 5.11 MIL/uL    Hemoglobin 12.5  12.0 - 15.0 g/dL    HCT 04.5  40.9 - 81.1 %    MCV 95.1  78.0 - 100.0 fL    MCH 32.2  26.0 - 34.0 pg    MCHC 33.9  30.0 - 36.0 g/dL    RDW 91.4  78.2 - 95.6 %    Platelets 155  150 - 400 K/uL   BASIC METABOLIC PANEL     Status: Abnormal   Collection Time   09/09/11  1:02 PM      Component Value Range Comment   Sodium 139  135 - 145 mEq/L    Potassium 4.4  3.5 - 5.1 mEq/L    Chloride 102  96 - 112 mEq/L    CO2 31  19 - 32 mEq/L    Glucose, Bld 115 (*) 70 - 99 mg/dL    BUN 11  6 - 23 mg/dL    Creatinine, Ser 2.13  0.50 - 1.10 mg/dL    Calcium 9.2  8.4 - 08.6 mg/dL    GFR calc non Af Amer 81 (*) >90  mL/min    GFR calc Af Amer >90  >90 mL/min   MRSA PCR SCREENING     Status: Normal   Collection Time   09/09/11  3:43 PM      Component Value Range Comment   MRSA by PCR NEGATIVE  NEGATIVE   BASIC METABOLIC PANEL     Status: Abnormal   Collection Time   09/10/11  6:05 AM      Component Value Range Comment   Sodium 137  135 - 145 mEq/L    Potassium 3.8  3.5 - 5.1 mEq/L    Chloride 101  96 - 112 mEq/L    CO2 27  19 - 32 mEq/L    Glucose, Bld 133 (*) 70 - 99 mg/dL    BUN 12  6 - 23 mg/dL    Creatinine, Ser 4.25  0.50 - 1.10 mg/dL    Calcium 8.2 (*) 8.4 - 10.5 mg/dL    GFR calc non Af Amer 81 (*) >90 mL/min    GFR calc Af Amer >90  >90 mL/min   CBC     Status: Abnormal   Collection Time   09/10/11  6:05 AM      Component Value Range Comment   WBC 7.7  4.0 - 10.5 K/uL    RBC 3.09 (*) 3.87 - 5.11 MIL/uL    Hemoglobin 10.0 (*) 12.0 - 15.0 g/dL     HCT 95.6 (*) 38.7 - 46.0 %    MCV 96.4  78.0 - 100.0 fL    MCH 32.4  26.0 - 34.0 pg    MCHC 33.6  30.0 - 36.0 g/dL    RDW 56.4  33.2 - 95.1 %    Platelets 145 (*) 150 - 400 K/uL    Dg Hip Complete Left  09/09/2011  *RADIOLOGY REPORT*  Clinical Data: Pain left femur  LEFT HIP - COMPLETE 2+ VIEW  Comparison: Plain films 04/16/2008, distal femur same day.  Findings: Internal fixation of the left femur.  The hips are located.  No evidence of fracture of the proximal left femur.  No evidence of pelvic fracture or sacral fracture.  Exam is limited by overlying metallic external fixator.  IMPRESSION:  No acute fracture of the proximal left femur or pelvis.  Fracture of the distal left femur on comparison exam.  Original Report Authenticated By: Genevive Bi, M.D.   Dg Femur Left  09/09/2011  *RADIOLOGY REPORT*  Clinical Data: Left femur fracture.  IM nail  DG C-ARM 1-60 MIN,LEFT FEMUR - 2 VIEW  Comparison:  None.  Findings: Intraoperative spot fluoroscopic views of the distal left femur are obtained for surgical control purposes.  Images demonstrate intramedullary rod fixation of distal femoral fractures with distal locking screws.  There is additional screw fixation of the femoral metaphyseal region.  Mild residual overriding of the fracture fragments.  Diffuse vascular calcification.  IMPRESSION: Intraoperative fluoroscopy of the distal left femur obtained demonstrating intramedullary rod fixation of distal femoral fractures.  Original Report Authenticated By: Marlon Pel, M.D.   Dg Femur Left  09/09/2011  *RADIOLOGY REPORT*  Clinical Data: Distal left femur fracture  LEFT FEMUR - 2 VIEW  Comparison: None.  Findings: There is an oblique fracture through the distal left femoral diaphysis.  Internal medullary nail is noted which extends into the metaphysis.  The knee joint appears intact. Extensive vascular calcification noted.  IMPRESSION: Oblique fracture of the distal left femur metaphysis.   Original Report Authenticated By: Genevive Bi, M.D.  Dg Chest Port 1 View  09/09/2011  *RADIOLOGY REPORT*  Clinical Data: Preoperative evaluation.  Femoral fracture.  PORTABLE CHEST - 1 VIEW  Comparison: Chest x-ray 04/16/2008.  Findings: Lung volumes are low.  Linear opacities at the left base favored to represent subsegmental atelectasis.  No definite consolidative airspace disease.  No definite pleural effusions. Mild prominence of the interstitial markings is chronic and unchanged.  No evidence of pulmonary edema.  Mild cardiomegaly. The patient is rotated to the left on today's exam, resulting in distortion of the mediastinal contours and reduced diagnostic sensitivity and specificity for mediastinal pathology. Atherosclerosis in the thoracic aorta.  IMPRESSION: 1.  Low lung volumes with subsegmental atelectasis or scarring in the left lower lobe. 2.  Mild cardiomegaly. 3.  Atherosclerosis.  Original Report Authenticated By: Florencia Reasons, M.D.   Dg Femur Left Port  09/09/2011  *RADIOLOGY REPORT*  Clinical Data: Postop distal femur.  PORTABLE LEFT FEMUR - 2 VIEW  Comparison: 09/09/2011 at 0938 hours  Findings: Postoperative changes in the left femur with intramedullary rod, proximal compression vault, and distal locking screws.  Distal locking screws and additional distal femoral fixation screw have been placed since the previous study.  Skin clips consistent with recent surgery.  Acute appearing spiral fractures demonstrated in the distal left femoral metaphysis, as previously demonstrated.  Slightly improved angulation.  Vascular calcifications.  IMPRESSION: Spiral fracture of the distal left femur with internal fixation.  Original Report Authenticated By: Marlon Pel, M.D.   Dg C-arm 1-60 Min  09/09/2011  *RADIOLOGY REPORT*  Clinical Data: Left femur fracture.  IM nail  DG C-ARM 1-60 MIN,LEFT FEMUR - 2 VIEW  Comparison:  None.  Findings: Intraoperative spot fluoroscopic views of the  distal left femur are obtained for surgical control purposes.  Images demonstrate intramedullary rod fixation of distal femoral fractures with distal locking screws.  There is additional screw fixation of the femoral metaphyseal region.  Mild residual overriding of the fracture fragments.  Diffuse vascular calcification.  IMPRESSION: Intraoperative fluoroscopy of the distal left femur obtained demonstrating intramedullary rod fixation of distal femoral fractures.  Original Report Authenticated By: Marlon Pel, M.D.    Review of Systems  Constitutional: Negative.   HENT: Negative.   Eyes: Negative.   Respiratory: Negative.   Cardiovascular: Negative.   Gastrointestinal: Negative.   Genitourinary: Negative.   Musculoskeletal: Positive for falls.       Pain in left thigh  Skin: Negative.   Neurological: Negative.   Endo/Heme/Allergies: Negative.   Psychiatric/Behavioral: Negative.     Blood pressure 134/47, pulse 83, temperature 98 F (36.7 C), temperature source Oral, resp. rate 18, SpO2 100.00%. Physical Exam  Constitutional: She is oriented to person, place, and time. She appears well-developed.  HENT:  Head: Normocephalic and atraumatic.  Eyes: EOM are normal. Pupils are equal, round, and reactive to light.  Neck: Normal range of motion.  Cardiovascular: Normal rate and regular rhythm.   Respiratory: Effort normal and breath sounds normal.  GI: Soft. Bowel sounds are normal.  Musculoskeletal:       Left thigh with edema and tenderness with palpation. No edema distal lower extremity.  Pulse DP trace and pulse PT +1 bilateral. Ankle DF and PF intact bilateral.  Removed traction device to assess skin and no breakdown noted.  Minimal shortening of left leg  Neurological: She is alert and oriented to person, place, and time.  Skin: Skin is warm and dry.  Psychiatric: She has a normal mood  and affect.     Assessment/Plan Left distal femur fracture with previous IM nailing for  intertrochanteric hip fracture using long nail with open distal interlocking screw holes.  PLAN:  Pt admitted for surgical intervention of ORIF of the fracture.  To be done later today by DR Magnus Ivan.  Fracture alignment with fracture table and placement of distal interlocking screw for the previously used IM nail.  Aydyn Testerman M 09/10/2011, 9:52 AM

## 2011-09-09 NOTE — ED Notes (Signed)
Pt noted to be in LBBB per EMS; unsure if hx of same

## 2011-09-09 NOTE — Brief Op Note (Signed)
09/09/2011  10:57 PM  PATIENT:  Elizabeth [name]  78 y.o. female  PRE-OPERATIVE DIAGNOSIS:  left distal femur fracture  POST-OPERATIVE DIAGNOSIS:  left distal femur fracture  PROCEDURE:  Procedure(s) (LRB): INTRAMEDULLARY (IM) NAIL FEMORAL (Left)  SURGEON:  Surgeon(s) and Role:    * Kathryne Hitch, MD - Primary  PHYSICIAN ASSISTANT:   ASSISTANTS: none   ANESTHESIA:   general  EBL:  Total I/O In: 1000 [I.V.:1000] Out: 450 [Urine:350; Blood:100]  BLOOD ADMINISTERED:none  DRAINS: none   LOCAL MEDICATIONS USED:  NONE  SPECIMEN:  No Specimen  DISPOSITION OF SPECIMEN:  N/A  COUNTS:  YES  TOURNIQUET:  * No tourniquets in log *  DICTATION: .Other Dictation: Dictation Number [id number]  PLAN OF CARE: Admit to inpatient   PATIENT DISPOSITION:  PACU - hemodynamically stable.   Delay start of Pharmacological VTE agent (>24hrs) due to surgical blood loss or risk of bleeding: no

## 2011-09-09 NOTE — Clinical Social Work Psychosocial (Signed)
Clinical Social Work Department BRIEF PSYCHOSOCIAL ASSESSMENT 09/09/2011  Patient:  Elizabeth [name], Elizabeth [name]     Account Number:  [id number]     Admit date:  09/09/2011  Clinical Social Worker:  Thomasene Mohair  Date/Time:  09/09/2011 12:30 PM  Referred by:  RN  Date Referred:  09/09/2011 Referred for  SNF Placement   Other Referral:   Interview type:  Patient Other interview type:    PSYCHOSOCIAL DATA Living Status:  FAMILY Admitted from facility:   Level of care:   Primary support name:  Celene Skeen Primary support relationship to patient:  FAMILY Degree of support available:   Pt's nephew lives with Pt, good support.  [phone]    CURRENT CONCERNS Current Concerns  Post-Acute Placement  Adjustment to Illness   Other Concerns:    SOCIAL WORK ASSESSMENT / PLAN CSW was referred to Pt d/t hip fracture protocol. 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 Pt has been to SNF in the past after a previous surgery. Pt shared that her sister is a long-term SNF resident at [facility]. Pt does not want snf, states that she feels her nephew will be able to assist her at home and that she would like physical therapy at home. CSW offered support and encouraged Pt to discuss rehab options with her doctor and physical therapist after surgery. Pt also requested, and CSW agreed, that CSW f/u with her nephew after surgery.    Assessment/plan status:  Psychosocial Support/Ongoing Assessment of Needs Other assessment/ plan:   Pt will need  PT/OT evals for insurance pre-authorization if SNF is recommended.   Information/referral to community resources:    PATIENT'S/FAMILY'S RESPONSE TO PLAN OF CARE: Pt is pleasant, alert and oriented. Her nephew is aware of the fall and will be coming to the hospital later today. Pt stated that she would like to go home after surgery  and have services in her home if she is able to. CSW will f/u with nephew to discuss placement needs if recommended.    Frederico Hamman, [place] [phone] ED Clinical Social Worker

## 2011-09-09 NOTE — Progress Notes (Signed)
Orthopedic Tech Progress Note Patient Details:  Elizabeth [name] 13-Sep-1923 [id number]  Musculoskeletal Traction Type of Traction: Bucks Skin Traction Traction Location: (L) LE Traction Weight: 5 lbs    Jennye Moccasin 09/09/2011, 4:04 PM

## 2011-09-09 NOTE — ED Notes (Signed)
Patient transported to X-ray 

## 2011-09-09 NOTE — Anesthesia Postprocedure Evaluation (Signed)
  Anesthesia Post-op Note  Patient: Elizabeth [name]  Procedure(s) Performed: Procedure(s) (LRB): INTRAMEDULLARY (IM) NAIL FEMORAL (Left)  Patient Location: PACU  Anesthesia Type: General  Level of Consciousness: awake, alert  and oriented  Airway and Oxygen Therapy: Patient Spontanous Breathing and Patient connected to nasal cannula oxygen  Post-op Pain: mild  Post-op Assessment: Post-op Vital signs reviewed  Post-op Vital Signs: Reviewed  Complications: No apparent anesthesia complications

## 2011-09-09 NOTE — ED Notes (Signed)
Per EMS: pt from home c/o fall this am with pain in left leg with crepitus noted to left femur area; pt sts pain with movement; CMS intact; pt in immobilizer for comfort; pt given fentanyl; IV 20g L hand

## 2011-09-10 ENCOUNTER — Encounter (HOSPITAL_COMMUNITY): Payer: Self-pay | Admitting: Orthopaedic Surgery

## 2011-09-10 LAB — BASIC METABOLIC PANEL
CO2: 27 mEq/L (ref 19–32)
Chloride: 101 mEq/L (ref 96–112)
Sodium: 137 mEq/L (ref 135–145)

## 2011-09-10 LAB — CBC
HCT: 29.8 % — ABNORMAL LOW (ref 36.0–46.0)
MCV: 96.4 fL (ref 78.0–100.0)
RBC: 3.09 MIL/uL — ABNORMAL LOW (ref 3.87–5.11)
WBC: 7.7 10*3/uL (ref 4.0–10.5)

## 2011-09-10 MED ORDER — PANTOPRAZOLE SODIUM 40 MG PO TBEC
40.0000 mg | DELAYED_RELEASE_TABLET | Freq: Every day | ORAL | Status: DC
  Administered 2011-09-10 – 2011-09-11 (×2): 40 mg via ORAL
  Filled 2011-09-10 (×2): qty 1

## 2011-09-10 NOTE — ED Provider Notes (Signed)
Medical screening examination/treatment/procedure(s) were conducted as a shared visit with non-physician practitioner(s) and myself.  I personally evaluated the patient during the encounter On my exam this pleasant 78 year old female presented after a mechanical fall is in no distress, though she is uncomfortable.  The patient is a notable deformity about her left proximal thigh. The patient's x-ray, reviewed by me, demonstrates a fracture around a previously surgically repaired femur.  We discussed the case with Dr. Magnus Ivan, who did the original surgery.  On my exam: Cardiac:70sr, normal Pulse ox 97%ra,normal  Date: 09/10/2011  Rate: 60  Rhythm: normal sinus rhythm  QRS Axis: left  Intervals: PR prolonged, qt prolonged  ST/T Wave abnormalities: nonspecific T wave changes  Conduction Disutrbances:first-degree A-V block   Narrative Interpretation:   Old EKG Reviewed: changes noted abnormal     Gerhard Munch, MD 09/10/11 [phone]

## 2011-09-10 NOTE — Op Note (Signed)
NAMEMarland Kitchen  Elizabeth, [name] NO.:  [id number]  MEDICAL RECORD NO.:  [id number]  LOCATION:  [id number]                        FACILITY:  [facility]  PHYSICIAN:  Vanita Panda. Magnus Ivan, M.D.DATE OF BIRTH:  [birth year]  DATE OF PROCEDURE:  09/09/2011 DATE OF DISCHARGE:                              OPERATIVE REPORT   PREOPERATIVE DIAGNOSIS:  Left femoral shaft fracture.  POSTOPERATIVE DIAGNOSIS:  Left femoral shaft fracture.  PROCEDURE:  Open reduction and internal fixation of left femoral shaft fracture.  SURGEON:  Vanita Panda. Magnus Ivan, M.D.  ANESTHESIA:  General.  BLOOD LOSS:  100 mL.  COMPLICATIONS:  None.  INDICATIONS:  Ms. Tatro is an 78 year old female who sustained a mechanical fall at home this morning with a twisting injury.  Eventually she noted she had a previous left hip intertrochanteric fracture in 2010.  At that time, we performed open reduction and internal fixation of the intertrochanteric hip fracture using intramedullary hip screw with a long rod going all the way down the femoral canal to the knee. She sustained a new fracture with a fracture of the distal third of the diaphysis with the nail in place.  My plan will be to take her to the operating room to pull traction, reduce the fracture, and place some distal interlocking screws to secure the fracture.  I explained this to her and her family.  They agreed to proceed with the surgery.  PROCEDURE:  After informed consent was obtained, appropriate left leg was marked.  She was brought to the operating room and general anesthesia was obtained while she was on the stretcher.  She was then placed supine on the fracture table with the perineal post in place and her left operative leg in in-line skeletal traction and the right leg in stirrup flexed and abducted out of field with appropriate padding in the popliteal area.  I then brought in the C-arm and we were able to pull traction and rotate the leg to  get it near-anatomic alignment, but certainly comminution to the fracture line did extend distally.  We then prepped the leg with DuraPrep and sterile drapes.  Time-out was called and she was identified as correct patient and correct left leg.  I then was able to make a small incision on the lateral aspect of her distal femur and place 2 interlocking screws from lateral to medial to secure the rod in place.  I then placed a interfragmentary screw just angle distally to further capture the fracture, which helped compress it further.  I was pleased with the alignment overall and then irrigated the soft tissue with normal saline solution.  I closed the deep tissue with 0-Vicryl followed by 2-0 Vicryl in the subcutaneous tissue, and interrupted staples on the skin.  Knee immobilizer was placed after well-padded sterile dressing. She was awakened, taken off the fracture table, extubated, and taken to the recovery room in a stable condition.  Postoperatively, she will be in a hinged knee brace with nonweightbearing on that leg.     Vanita Panda. Magnus Ivan, M.D.     CYB/MEDQ  D:  09/09/2011  T:  09/10/2011  Job:  [id number]

## 2011-09-10 NOTE — Progress Notes (Signed)
Orthopedic Tech Progress Note Patient Details:  Elizabeth [name] 05/08/1923 [id number] Brace order fitted by Sinda Du vendor. Patient ID: Elizabeth [name], female   DOB: [birth year], 78 y.o.   MRN: [id number]   Elizabeth [name] 09/10/2011, 7:52 PM

## 2011-09-10 NOTE — Evaluation (Signed)
Physical Therapy Evaluation Patient Details Name: Elizabeth [name] MRN: [id number] DOB: May 12, 1923 Today's Date: 09/10/2011 Time: 4098-1191 PT Time Calculation (min): 23 min  PT Assessment / Plan / Recommendation Clinical Impression  Pt is an 78 y/o female s/p IM nail to Left Femur. Pt is fearful of falling and participates minimally in functional mobility. Pt lives with her nephew who works full time. Pt will require more assistance than nephew likely able to provide at this time. Acute Pt will follow pt to improve mobility in preparation for SNF placement for rehab.     PT Assessment  Patient needs continued PT services    Follow Up Recommendations  Skilled nursing facility;Supervision/Assistance - 24 hour    Barriers to Discharge Decreased caregiver support      Equipment Recommendations  Defer to next venue    Recommendations for Other Services     Frequency Min 3X/week    Precautions / Restrictions Precautions Precautions: Fall Required Braces or Orthoses: Other Brace/Splint Other Brace/Splint: hinged knee brace.  Restrictions Weight Bearing Restrictions: Yes LLE Weight Bearing: Weight bearing as tolerated   Pertinent Vitals/Pain Pt c/o severe pain in L LE with all active or passive movement.  8/10 L Knee.  RN made aware.  Repositioned pt's LE with pillows in recliner.  Pt resting comfortably.      Mobility  Bed Mobility Bed Mobility: Supine to Sit Supine to Sit: 1: +1 Total assist Details for Bed Mobility Assistance: Step by step instruction and repeated cueing for all aspects of transition to sitting.  Transfers Transfers: Sit to Stand;Stand to Sit;Stand Pivot Transfers Sit to Stand: 1: +2 Total assist;From chair/3-in-1;With upper extremity assist Sit to Stand: Patient Percentage: 40% Stand to Sit: 1: +2 Total assist Stand to Sit: Patient Percentage: 20% Stand Pivot Transfers: 1: +2 Total assist Stand Pivot Transfers: Patient Percentage: 20% Details for Transfer  Assistance: Pt fearful of falling.  Pt required constant instruction and cueing for technique.  Pt required repeated cueing for weight bearing in L LE. Pt paniced during transfer and attempted to sit in mid transfer.  Required +2 total assist to prevent pt from falling.   Ambulation/Gait Ambulation/Gait Assistance: Not tested (comment) (attempted by pt unable to take a step with the R leg.) Wheelchair Mobility Wheelchair Mobility: No    Exercises     PT Diagnosis: Difficulty walking;Generalized weakness;Acute pain  PT Problem List: Decreased strength;Decreased activity tolerance;Decreased range of motion;Decreased mobility;Decreased coordination;Decreased cognition;Decreased knowledge of use of DME;Decreased safety awareness;Decreased knowledge of precautions;Pain PT Treatment Interventions: Gait training;DME instruction;Functional mobility training;Therapeutic activities;Therapeutic exercise;Neuromuscular re-education;Patient/family education;Wheelchair mobility training   PT Goals Acute Rehab PT Goals PT Goal Formulation: With patient Time For Goal Achievement: 09/24/11 Potential to Achieve Goals: Fair Pt will go Supine/Side to Sit: with min assist PT Goal: Supine/Side to Sit - Progress: Goal set today Pt will go Sit to Supine/Side: with min assist PT Goal: Sit to Supine/Side - Progress: Goal set today Pt will Transfer Bed to Chair/Chair to Bed: with min assist PT Transfer Goal: Bed to Chair/Chair to Bed - Progress: Goal set today Pt will Ambulate: 1 - 15 feet;with min assist;with rolling walker PT Goal: Ambulate - Progress: Goal set today Pt will Propel Wheelchair: 10 - 50 feet;with min assist PT Goal: Propel Wheelchair - Progress: Goal set today  Visit Information  Last PT Received On: 09/10/11 Assistance Needed: +2    Subjective Data  Subjective: I cant stand or walk   Patient Stated Goal: none stated.  Prior Functioning  Home Living Lives With: Family (Nephew) Available  Help at Discharge: Available PRN/intermittently Type of Home: House Home Access: Ramped entrance Home Layout: One level Home Adaptive Equipment: Walker - rolling;Bedside commode/3-in-1;Wheelchair - manual Prior Function Level of Independence: Independent Able to Take Stairs?: Yes Driving: No    Cognition  Overall Cognitive Status: Appears within functional limits for tasks assessed/performed Arousal/Alertness: Awake/alert Orientation Level: Appears intact for tasks assessed Behavior During Session: [facility] for tasks performed    Extremity/Trunk Assessment Right Upper Extremity Assessment RUE ROM/Strength/Tone: Within functional levels Left Upper Extremity Assessment LUE ROM/Strength/Tone: Within functional levels Right Lower Extremity Assessment RLE ROM/Strength/Tone: Within functional levels Left Lower Extremity Assessment LLE ROM/Strength/Tone: Unable to fully assess;Due to pain Trunk Assessment Trunk Assessment: Normal   Balance Balance Balance Assessed: No  End of Session PT - End of Session Equipment Utilized During Treatment: Gait belt;Oxygen (LE hinged brace.  ) Activity Tolerance: Patient limited by fatigue;Treatment limited secondary to agitation Patient left: in chair;with family/visitor present;with call bell/phone within reach Nurse Communication: Mobility status;Weight bearing status (+2 total assist)  GP     Elizabeth [name] 09/10/2011, 5:39 PM  Elizabeth [name] L. Jilliam Bellmore DPT [phone]

## 2011-09-10 NOTE — Progress Notes (Signed)
Orthopedic Tech Progress Note Patient Details:  Elizabeth [name] 1923-10-24 [id number]   trapeze bar   Cammer, Mickie Bail 09/10/2011, 5:06 PM

## 2011-09-10 NOTE — Progress Notes (Signed)
Orthopedic Tech Progress Note Patient Details:  Elizabeth [name] Jan 28, 1924 [id number]  Patient ID: Elizabeth [name], female   DOB: 24-Oct-1923, 78 y.o.   MRN: [id number]   Elizabeth [name] 09/10/2011, 9:24 AM Called bio tech for left bledsoe brace

## 2011-09-10 NOTE — Progress Notes (Signed)
Subjective: 1 Day Post-Op Procedure(s) (LRB): INTRAMEDULLARY (IM) NAIL FEMORAL (Left) Patient reports pain as moderate.  Asymptomatic acute blood loss anemia. Objective: Vital signs in last 24 hours: Temp:  [97.5 F (36.4 C)-98 F (36.7 C)] 98 F (36.7 C) (08/07 0546) Pulse Rate:  [61-88] 83  (08/07 0546) Resp:  [13-24] 18  (08/07 0546) BP: (127-160)/(47-70) 134/47 mmHg (08/07 0546) SpO2:  [96 %-100 %] 100 % (08/07 0546)  Intake/Output from previous day: 08/06 0701 - 08/07 0700 In: 1300 [I.V.:1300] Out: 1600 [Urine:1500; Blood:100] Intake/Output this shift:     Basename 09/10/11 0605 09/09/11 1302 09/09/11 0850  HGB 10.0* 12.5 12.7    Basename 09/10/11 0605 09/09/11 1302  WBC 7.7 11.1*  RBC 3.09* 3.88  HCT 29.8* 36.9  PLT 145* 155    Basename 09/09/11 1302 09/09/11 0850  NA 139 138  K 4.4 3.9  CL 102 103  CO2 31 28  BUN 11 12  CREATININE 0.59 0.57  GLUCOSE 115* 164*  CALCIUM 9.2 8.5   No results found for this basename: LABPT:2,INR:2 in the last 72 hours  Sensation intact distally Intact pulses distally Dorsiflexion/Plantar flexion intact Incision: dressing C/D/I  Assessment/Plan: 1 Day Post-Op Procedure(s) (LRB): INTRAMEDULLARY (IM) NAIL FEMORAL (Left) Up with therapy, NWB left leg. Bledsoe brace today.  Dennys Traughber Y 09/10/2011, 7:08 AM

## 2011-09-10 NOTE — Progress Notes (Signed)
UR COMPLETED  

## 2011-09-11 LAB — URINE CULTURE: Colony Count: >=100000

## 2011-09-11 LAB — BASIC METABOLIC PANEL
BUN: 9 mg/dL (ref 6–23)
CO2: 27 mEq/L (ref 19–32)
Chloride: 108 mEq/L (ref 96–112)
Creatinine, Ser: 0.58 mg/dL (ref 0.50–1.10)

## 2011-09-11 MED ORDER — COUMADIN BOOK
1.0000 | Freq: Once | Status: DC
  Filled 2011-09-11: qty 1

## 2011-09-11 MED ORDER — WARFARIN SODIUM 4 MG PO TABS
4.0000 mg | ORAL_TABLET | Freq: Every day | ORAL | Status: DC
  Filled 2011-09-11: qty 1

## 2011-09-11 MED ORDER — CIPROFLOXACIN HCL 500 MG PO TABS
500.0000 mg | ORAL_TABLET | Freq: Two times a day (BID) | ORAL | Status: DC
  Administered 2011-09-11 – 2011-09-12 (×2): 500 mg via ORAL
  Filled 2011-09-11 (×4): qty 1

## 2011-09-11 MED ORDER — WARFARIN - PHARMACIST DOSING INPATIENT
Freq: Every day | Status: DC
  Administered 2011-09-12: 18:00:00

## 2011-09-11 MED ORDER — BISACODYL 10 MG RE SUPP
10.0000 mg | Freq: Once | RECTAL | Status: AC
  Administered 2011-09-11: 10 mg via RECTAL
  Filled 2011-09-11: qty 1

## 2011-09-11 MED ORDER — WARFARIN SODIUM 4 MG PO TABS
4.0000 mg | ORAL_TABLET | Freq: Every day | ORAL | Status: DC
  Administered 2011-09-11 – 2011-09-12 (×2): 4 mg via ORAL
  Filled 2011-09-11 (×2): qty 1

## 2011-09-11 MED ORDER — WARFARIN VIDEO: 1.0000 | Freq: Once | Status: DC

## 2011-09-11 MED ORDER — FLEET ENEMA 7-19 GM/118ML RE ENEM: 1.0000 | ENEMA | Freq: Once | RECTAL | Status: DC

## 2011-09-11 MED ORDER — LEVOTHYROXINE SODIUM 50 MCG PO TABS
50.0000 ug | ORAL_TABLET | Freq: Every day | ORAL | Status: DC
  Administered 2011-09-12: 50 ug via ORAL
  Filled 2011-09-11 (×2): qty 1

## 2011-09-11 NOTE — Clinical Documentation Improvement (Signed)
Abnormal Labs Clarification  THIS DOCUMENT IS NOT A PERMANENT PART OF THE MEDICAL RECORD  TO RESPOND TO THE THIS QUERY, FOLLOW THE INSTRUCTIONS BELOW:  1. If needed, update documentation for the patient's encounter via the notes activity.  2. Access this query again and click edit on the [employer].  3. After updating, or not, click F2 to complete all highlighted (required) fields concerning your review. Select "additional documentation in the medical record" OR "no additional documentation provided".  4. Click Sign note button.  5. The deficiency will fall out of your InBasket *Please let us know if you are not able to complete this workflow by phone or e-mail (listed below).  Please update your documentation within the medical record to reflect your response to this query.                                                                                   09/11/11  Dear Dr. [facility]  In a better effort to capture your patient's severity of illness, reflect appropriate length of stay and utilization of resources, a review of the medical record has revealed the following indicators.    Based on your clinical judgment, please clarify and document in a progress note and/or discharge summary the clinical condition associated with the following supporting information:  In responding to this query please exercise your independent judgment.  The fact that a query is asked, does not imply that any particular answer is desired or expected.  Abnormal findings (laboratory, x-ray, pathologic, and other diagnostic results) are not coded and reported unless the physician indicates their clinical significance.   The medical record reflects the following clinical findings, please clarify the diagnostic and/or clinical significance:      Possible Clinical Conditions                              UTI  Other Condition                 Cannot Clinically Determine       Supporting Information: "Urinary Tract Infection" per ED physician note  Diagnostics:   Urine Culture on 09/09/11                            Specimen Description URINE, CATHETERIZED Special Requests ADDED ON [id number] AT 1216  Culture Setup Time 09/09/2011 14:28 [place] Count >=100,000 COLONIES/ML Culture ESCHERICHIA COLI  Report Status PENDING  Treatment:  Monitoring labs, vital signs and I &O  Conditions documented as possible, probable, or suspected can be coded if restated at the time of discharge.   Reviewed: additional documentation in the medical record   Thank You,    Rossie Muskrat RN BSN Clinical Documentation Specialist Pager:  [phone] [email or web address]  Health Information Management [place]

## 2011-09-11 NOTE — Progress Notes (Signed)
Subjective: 2 Days Post-Op Procedure(s) (LRB): INTRAMEDULLARY (IM) NAIL FEMORAL (Left) Patient reports pain as moderate.    Objective: Vital signs in last 24 hours: Temp:  [97.5 F (36.4 C)-98.6 F (37 C)] 97.5 F (36.4 C) (08/08 0553) Pulse Rate:  [76-93] 76  (08/08 0553) Resp:  [16-20] 16  (08/08 0553) BP: (125-140)/(41-70) 135/47 mmHg (08/08 0553) SpO2:  [97 %-100 %] 100 % (08/08 0553)  Intake/Output from previous day: 08/07 0701 - 08/08 0700 In: 1335 [P.O.:460; I.V.:875] Out: 1100 [Urine:1100] Intake/Output this shift: Total I/O In: 1115 [P.O.:240; I.V.:875] Out: 650 [Urine:650]   Basename 09/10/11 0605 09/09/11 1302 09/09/11 0850  HGB 10.0* 12.5 12.7    Basename 09/10/11 0605 09/09/11 1302  WBC 7.7 11.1*  RBC 3.09* 3.88  HCT 29.8* 36.9  PLT 145* 155    Basename 09/10/11 0605 09/09/11 1302  NA 137 139  K 3.8 4.4  CL 101 102  CO2 27 31  BUN 12 11  CREATININE 0.58 0.59  GLUCOSE 133* 115*  CALCIUM 8.2* 9.2   No results found for this basename: LABPT:2,INR:2 in the last 72 hours  Sensation intact distally Intact pulses distally Dorsiflexion/Plantar flexion intact Incision: dressing C/D/I  Assessment/Plan: 2 Days Post-Op Procedure(s) (LRB): INTRAMEDULLARY (IM) NAIL FEMORAL (Left) Up with therapy Plan for discharge tomorrow Discharge to SNF  [facility] Y 09/11/2011, 6:51 AM

## 2011-09-11 NOTE — Progress Notes (Addendum)
ANTICOAGULATION CONSULT NOTE - Initial Consult  Pharmacy Consult for warfarin Indication: VTE prophylaxis  No Known Allergies  Patient Measurements:    Vital Signs: Temp: 97.5 F (36.4 C) (08/08 0553) BP: 135/47 mmHg (08/08 0553) Pulse Rate: 76  (08/08 0553)  Labs:  Basename 09/10/11 0605 09/09/11 1302 09/09/11 0850  HGB 10.0* 12.5 --  HCT 29.8* 36.9 37.5  PLT 145* 155 142*  APTT -- -- --  LABPROT -- -- --  INR -- -- --  HEPARINUNFRC -- -- --  CREATININE 0.58 0.59 0.57  CKTOTAL -- -- --  CKMB -- -- --  TROPONINI -- -- --    The CrCl is unknown because both a height and weight (above a minimum accepted value) are required for this calculation.   Medical History: Past Medical History  Diagnosis Date  . Thyroid disease   . Hypertension     Medications:  Scheduled:    .  ceFAZolin (ANCEF) IV  1 g Intravenous Q6H  . cloNIDine  0.1 mg Oral Daily  . coumadin book  1 each Does not apply Once  . furosemide  20 mg Oral Daily  . pantoprazole  40 mg Oral Q1200  . potassium chloride  10 mEq Oral Daily  . warfarin  1 each Does not apply Once    Assessment: 78 yo female is now POD#2 intramedullary nail femoral. Pharmacy consulted to manage warfarin for DVT prophylaxis. No baseline INR currently.   Goal of Therapy:  INR 2-3 Monitor platelets by anticoagulation protocol: Yes   Plan:  1. Coumadin 4 mg po daily at 1800 2. Follow-up INR for Coumadin dosing.   Thank you. Okey Regal, PharmD [phone]  09/11/2011,7:03 AM

## 2011-09-11 NOTE — Evaluation (Signed)
Occupational Therapy Evaluation Patient Details Name: Elizabeth [name] MRN: [id number] DOB: 11-10-1923 Today's Date: 09/11/2011 Time: 1028-1100 OT Time Calculation (min): 32 min  OT Assessment / Plan / Recommendation Clinical Impression  [age] yr old female admitted after fall sustaining left femur fracture.  Underwent IM nailing by Dr Magnus Ivan and now presents with significant impairments in all areas of selfcare.  Recommend acute OT services to help pt increase independence with basic ADLs and functional transfers.  Feel she will need extensive SNF level therapies once discharged from acute.  Currently unable to maintain NWBing status on the  LLE.    OT Assessment  Patient needs continued OT Services    Follow Up Recommendations  Skilled nursing facility    Barriers to Discharge Decreased caregiver support    Equipment Recommendations  Defer to next venue       Frequency  Min 2X/week    Precautions / Restrictions Precautions Precautions: Fall Required Braces or Orthoses: Other Brace/Splint Other Brace/Splint: Hinged Bledsoe Brace on the left LE Restrictions Weight Bearing Restrictions: Yes LLE Weight Bearing: Non weight bearing   Pertinent Vitals/Pain Pt with moderate pain in the LLE 8/10 based on faces scale, helped reposition the LLE and meds given earlier per schedule    ADL  Eating/Feeding: Simulated;Independent Where Assessed - Eating/Feeding: Chair Grooming: Simulated;Set up Where Assessed - Grooming: Supported sitting Upper Body Bathing: Simulated;Set up Where Assessed - Upper Body Bathing: Supported sitting Lower Body Bathing: Simulated;+2 Total assistance Lower Body Bathing: Patient Percentage: 20% Where Assessed - Lower Body Bathing: Supported sit to stand Upper Body Dressing: Simulated;Set up Where Assessed - Upper Body Dressing: Supported sitting Lower Body Dressing: Simulated;+2 Total assistance Lower Body Dressing: Patient Percentage: 20% Where  Assessed - Lower Body Dressing: Supported sit to stand Toilet Transfer: Simulated;+2 Total assistance Toilet Transfer: Patient Percentage: 20% Statistician Method: Ambulance person: Other (comment) (To bedside chair) Toileting - Clothing Manipulation and Hygiene: Simulated;+2 Total assistance Toileting - Clothing Manipulation and Hygiene: Patient Percentage: 20% Where Assessed - Toileting Clothing Manipulation and Hygiene: Sit to stand from 3-in-1 or toilet Tub/Shower Transfer Method: Not assessed Equipment Used: Other (comment) (Bledsoe brace) Transfers/Ambulation Related to ADLs: Pt needs total +2 (pt 20%) for transfer to bedside chair squat pivot.  Unable to maintain NWBing over the LLE. ADL Comments: Pt with moderate selfcare limitations.  Increased anxiety and fear with all movement and attempted transfers.  Decreased awareness of the understanding as to why she needs to attempt getting out of bed.  Will likely need AE education for LB selfcare.    OT Diagnosis: Generalized weakness;Acute pain  OT Problem List: Decreased strength;Impaired balance (sitting and/or standing);Decreased knowledge of use of DME or AE;Pain OT Treatment Interventions: Self-care/ADL training;Therapeutic activities;DME and/or AE instruction;Patient/family education;Balance training   OT Goals Acute Rehab OT Goals OT Goal Formulation: With patient/family Time For Goal Achievement: 09/25/11 Potential to Achieve Goals: Fair ADL Goals Pt Will Perform Lower Body Bathing: with mod assist;Sit to stand from bed;with adaptive equipment ADL Goal: Lower Body Bathing - Progress: Goal set today Pt Will Transfer to Toilet: with mod assist;with DME;3-in-1;Squat pivot transfer;Stand pivot transfer;Maintaining weight bearing status ADL Goal: Toilet Transfer - Progress: Goal set today Pt Will Perform Toileting - Clothing Manipulation: with mod assist;Sitting on 3-in-1 or toilet;Standing ADL Goal:  Toileting - Clothing Manipulation - Progress: Goal set today Pt Will Perform Toileting - Hygiene: with mod assist;Sit to stand from 3-in-1/toilet ADL Goal: Toileting - Hygiene - Progress: Goal  set today Miscellaneous OT Goals Miscellaneous OT Goal #1: Pt will transition supine to sit EOB with mod assist in preparation for slefcare activities. OT Goal: Miscellaneous Goal #1 - Progress: Goal set today  Visit Information  Last OT Received On: 09/11/11 Assistance Needed: +2    Subjective Data  Subjective: "I can't get up the Dr. told me I couldn't." Patient Stated Goal: To get back to being independent.   Prior Functioning  Vision/Perception  Home Living Lives With: Family Immunologist) Available Help at Discharge: Available PRN/intermittently Type of Home: House Home Access: Ramped entrance Home Layout: One level Home Adaptive Equipment: Walker - rolling;Bedside commode/3-in-1;Wheelchair - manual Prior Function Level of Independence: Independent Able to Take Stairs?: Yes Driving: No   Vision - Assessment Vision Assessment: Vision not tested Perception Perception: Within Functional Limits Praxis Praxis: Intact  Cognition  Overall Cognitive Status: Impaired Area of Impairment: Memory Arousal/Alertness: Awake/alert Orientation Level: Appears intact for tasks assessed Behavior During Session: Anxious Cognition - Other Comments: Pt with decreased awareness of why she needs to try to get up with therapy.    Extremity/Trunk Assessment Right Upper Extremity Assessment RUE ROM/Strength/Tone: Within functional levels RUE Sensation: WFL - Light Touch RUE Coordination: WFL - gross/fine motor Left Upper Extremity Assessment LUE ROM/Strength/Tone: Within functional levels LUE Sensation: WFL - Light Touch LUE Coordination: WFL - gross/fine motor Trunk Assessment Trunk Assessment: Normal   Mobility Bed Mobility Bed Mobility: Supine to Sit Supine to Sit: 1: +2 Total assist Supine to  Sit: Patient Percentage: 20% Details for Bed Mobility Assistance: Max demonstrational cueing for transition from supine to sit EOB.      Balance Balance Balance Assessed: Yes Static Sitting Balance Static Sitting - Balance Support: Right upper extremity supported;Left upper extremity supported Static Sitting - Level of Assistance: 5: Stand by assistance  End of Session OT - End of Session Activity Tolerance: Patient limited by pain Patient left: in chair;with call bell/phone within reach;with family/visitor present Nurse Communication: Mobility status     Shelle Galdamez OTR/L Pager number [phone] 09/11/2011, 11:26 AM

## 2011-09-11 NOTE — Progress Notes (Signed)
Patient ID: Elizabeth [name], female   DOB: [birth year], 78 y.o.   MRN: [id number] Elizabeth [name] is being treated for a UTI.  She was asymptomatic from this UTI, which was picked up on a pre-op routine lab to prepare for surgery.  This did not contribute to her mechanical fall and she is being treated with antibiotics.

## 2011-09-11 NOTE — Progress Notes (Signed)
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 Pt doesn't wear hearing aids.

## 2011-09-12 LAB — BASIC METABOLIC PANEL
BUN: 9 mg/dL (ref 6–23)
Calcium: 8.2 mg/dL — ABNORMAL LOW (ref 8.4–10.5)
GFR calc Af Amer: >90 mL/min (ref >90)
GFR calc non Af Amer: 83 mL/min — ABNORMAL LOW (ref >90)
Glucose, Bld: 124 mg/dL — ABNORMAL HIGH (ref 70–99)

## 2011-09-12 MED ORDER — WARFARIN SODIUM 5 MG PO TABS: 5.0000 mg | ORAL_TABLET | Freq: Every day | ORAL | Status: DC

## 2011-09-12 MED ORDER — OXYCODONE-ACETAMINOPHEN 5-325 MG PO TABS: 1.0000 | ORAL_TABLET | ORAL | Status: AC | PRN

## 2011-09-12 MED ORDER — METHOCARBAMOL 500 MG PO TABS: 500.0000 mg | ORAL_TABLET | Freq: Four times a day (QID) | ORAL | Status: AC | PRN

## 2011-09-12 MED ORDER — CIPROFLOXACIN HCL 500 MG PO TABS: 500.0000 mg | ORAL_TABLET | Freq: Two times a day (BID) | ORAL | Status: AC

## 2011-09-12 NOTE — Progress Notes (Signed)
Patient ID: Elizabeth [name], female   DOB: 1923/02/14, 78 y.o.   MRN: [id number] No acute changes. Stable for discharge to SNF both medically and from and ortho standpoint.

## 2011-09-12 NOTE — Discharge Summary (Signed)
Patient ID: Elizabeth [name] MRN: [id number] DOB/AGE: Oct 14, 1923 78 y.o.  Admit date: 09/09/2011 Discharge date: 09/12/2011  Admission Diagnoses:  Principal Problem:  *Closed fracture of left distal femur   Discharge Diagnoses:  Same  Past Medical History  Diagnosis Date  . Thyroid disease   . Hypertension     Surgeries: Procedure(s): INTRAMEDULLARY (IM) NAIL FEMORAL on 09/09/2011   Consultants: Treatment Team:  Kathryne Hitch, MD  Discharged Condition: Improved  Hospital Course: Elizabeth [name] is an 78 y.o. female who was admitted 09/09/2011 for operative treatment ofClosed fracture of left distal femur. Patient has severe unremitting pain that affects sleep, daily activities, and work/hobbies. After pre-op clearance the patient was taken to the operating room on 09/09/2011 and underwent  Procedure(s): INTRAMEDULLARY (IM) NAIL FEMORAL.    Patient was given perioperative antibiotics: Anti-infectives     Start     Dose/Rate Route Frequency Ordered Stop   09/12/11 0000   ciprofloxacin (CIPRO) 500 MG tablet        500 mg Oral 2 times daily 09/12/11 0645 09/22/11 2359   09-27-11 2000   ciprofloxacin (CIPRO) tablet 500 mg        500 mg Oral 2 times daily 09-27-11 1751     09/10/11 0200   ceFAZolin (ANCEF) IVPB 1 g/50 mL premix        1 g 100 mL/hr over 30 Minutes Intravenous Every 6 hours 09/09/11 2357 09/10/11 1521   09/09/11 1500   ceFAZolin (ANCEF) IVPB 2 g/50 mL premix        2 g 100 mL/hr over 30 Minutes Intravenous 60 min pre-op 09/09/11 1413 09/09/11 2101           Patient was given sequential compression devices, early ambulation, and chemoprophylaxis to prevent DVT.  Patient benefited maximally from hospital stay and there were no complications.    Recent vital signs: Patient Vitals for the past 24 hrs:  BP Temp Pulse Resp SpO2  09/12/11 0506 144/49 mmHg 98.1 F (36.7 C) 86  16  94 %  09/27/11 2134 133/44 mmHg 98 F (36.7 C) 93  16  96 %  09-27-11  1451 133/70 mmHg 97.6 F (36.4 C) 62  17  95 %     Recent laboratory studies:  Basename 09/12/11 0620 27-Sep-2011 0720 09-27-2011 0551 09/10/11 0605 09/09/11 1302  WBC -- -- -- 7.7 11.1*  HGB -- -- -- 10.0* 12.5  HCT -- -- -- 29.8* 36.9  PLT -- -- -- 145* 155  NA 138 -- 140 -- --  K 4.2 -- 4.2 -- --  CL 105 -- 108 -- --  CO2 23 -- 27 -- --  BUN 9 -- 9 -- --  CREATININE 0.54 -- 0.58 -- --  GLUCOSE 124* -- 112* -- --  INR 1.16 1.09 -- -- --  CALCIUM 8.2* -- -- -- --     Discharge Medications:   Medication List  As of 09/12/2011 10:29 AM   TAKE these medications         ciprofloxacin 500 MG tablet   Commonly known as: CIPRO   Take 1 tablet (500 mg total) by mouth 2 (two) times daily.      cloNIDine 0.1 MG tablet   Commonly known as: CATAPRES   Take 0.1 mg by mouth daily.      furosemide 20 MG tablet   Commonly known as: LASIX   Take 20 mg by mouth daily.      methocarbamol 500  MG tablet   Commonly known as: ROBAXIN   Take 1 tablet (500 mg total) by mouth every 6 (six) hours as needed.      oxyCODONE-acetaminophen 5-325 MG per tablet   Commonly known as: PERCOCET/ROXICET   Take 1 tablet by mouth every 4 (four) hours as needed for pain.      potassium chloride 10 MEQ tablet   Commonly known as: K-DUR,KLOR-CON   Take 10 mEq by mouth daily.      SYNTHROID PO   Take 1 tablet by mouth daily.      VITAMIN B-12 PO   Take 1 tablet by mouth daily.      VITAMIN D PO   Take 1 tablet by mouth 2 (two) times daily.      warfarin 5 MG tablet   Commonly known as: COUMADIN   Take 1 tablet (5 mg total) by mouth daily.            Diagnostic Studies: Dg Hip Complete Left  09/09/2011  *RADIOLOGY REPORT*  Clinical Data: Pain left femur  LEFT HIP - COMPLETE 2+ VIEW  Comparison: Plain films 04/16/2008, distal femur same day.  Findings: Internal fixation of the left femur.  The hips are located.  No evidence of fracture of the proximal left femur.  No evidence of pelvic fracture or  sacral fracture.  Exam is limited by overlying metallic external fixator.  IMPRESSION:  No acute fracture of the proximal left femur or pelvis.  Fracture of the distal left femur on comparison exam.  Original Report Authenticated By: Genevive Bi, M.D.   Dg Femur Left  09/09/2011  *RADIOLOGY REPORT*  Clinical Data: Left femur fracture.  IM nail  DG C-ARM 1-60 MIN,LEFT FEMUR - 2 VIEW  Comparison:  None.  Findings: Intraoperative spot fluoroscopic views of the distal left femur are obtained for surgical control purposes.  Images demonstrate intramedullary rod fixation of distal femoral fractures with distal locking screws.  There is additional screw fixation of the femoral metaphyseal region.  Mild residual overriding of the fracture fragments.  Diffuse vascular calcification.  IMPRESSION: Intraoperative fluoroscopy of the distal left femur obtained demonstrating intramedullary rod fixation of distal femoral fractures.  Original Report Authenticated By: Marlon Pel, M.D.   Dg Femur Left  09/09/2011  *RADIOLOGY REPORT*  Clinical Data: Distal left femur fracture  LEFT FEMUR - 2 VIEW  Comparison: None.  Findings: There is an oblique fracture through the distal left femoral diaphysis.  Internal medullary nail is noted which extends into the metaphysis.  The knee joint appears intact. Extensive vascular calcification noted.  IMPRESSION: Oblique fracture of the distal left femur metaphysis.  Original Report Authenticated By: Genevive Bi, M.D.   Dg Chest Port 1 View  09/09/2011  *RADIOLOGY REPORT*  Clinical Data: Preoperative evaluation.  Femoral fracture.  PORTABLE CHEST - 1 VIEW  Comparison: Chest x-ray 04/16/2008.  Findings: Lung volumes are low.  Linear opacities at the left base favored to represent subsegmental atelectasis.  No definite consolidative airspace disease.  No definite pleural effusions. Mild prominence of the interstitial markings is chronic and unchanged.  No evidence of pulmonary  edema.  Mild cardiomegaly. The patient is rotated to the left on today's exam, resulting in distortion of the mediastinal contours and reduced diagnostic sensitivity and specificity for mediastinal pathology. Atherosclerosis in the thoracic aorta.  IMPRESSION: 1.  Low lung volumes with subsegmental atelectasis or scarring in the left lower lobe. 2.  Mild cardiomegaly. 3.  Atherosclerosis.  Original  Report Authenticated By: Florencia Reasons, M.D.   Dg Femur Left Port  09/09/2011  *RADIOLOGY REPORT*  Clinical Data: Postop distal femur.  PORTABLE LEFT FEMUR - 2 VIEW  Comparison: 09/09/2011 at 0938 hours  Findings: Postoperative changes in the left femur with intramedullary rod, proximal compression vault, and distal locking screws.  Distal locking screws and additional distal femoral fixation screw have been placed since the previous study.  Skin clips consistent with recent surgery.  Acute appearing spiral fractures demonstrated in the distal left femoral metaphysis, as previously demonstrated.  Slightly improved angulation.  Vascular calcifications.  IMPRESSION: Spiral fracture of the distal left femur with internal fixation.  Original Report Authenticated By: Marlon Pel, M.D.   Dg C-arm 1-60 Min  09/09/2011  *RADIOLOGY REPORT*  Clinical Data: Left femur fracture.  IM nail  DG C-ARM 1-60 MIN,LEFT FEMUR - 2 VIEW  Comparison:  None.  Findings: Intraoperative spot fluoroscopic views of the distal left femur are obtained for surgical control purposes.  Images demonstrate intramedullary rod fixation of distal femoral fractures with distal locking screws.  There is additional screw fixation of the femoral metaphyseal region.  Mild residual overriding of the fracture fragments.  Diffuse vascular calcification.  IMPRESSION: Intraoperative fluoroscopy of the distal left femur obtained demonstrating intramedullary rod fixation of distal femoral fractures.  Original Report Authenticated By: Marlon Pel,  M.D.    Disposition: to SNF  Discharge Orders    Future Orders Please Complete By Expires   Diet - low sodium heart healthy      Call MD / Call 911      Comments:   If you experience chest pain or shortness of breath, CALL 911 and be transported to the hospital emergency room.  If you develope a fever above 101 F, pus (white drainage) or increased drainage or redness at the wound, or calf pain, call your surgeon's office.   Constipation Prevention      Comments:   Drink plenty of fluids.  Prune juice may be helpful.  You may use a stool softener, such as Colace (over the counter) 100 mg twice a day.  Use MiraLax (over the counter) for constipation as needed.   Increase activity slowly as tolerated      Discharge instructions      Comments:   Change dressing left thigh once daily with new dry dressing.  Can get incision wet. Does not need to wear brace on left leg while in bed. Non-weight-bearing left leg and wear brace when up. Follow-up at [facility] with Dr. Magnus Ivan in 2 weeks [phone])         Signed: Kathryne Hitch 09/12/2011, 10:29 AM

## 2011-09-12 NOTE — Progress Notes (Signed)
ANTICOAGULATION CONSULT NOTE - Follow-Up Consult  Pharmacy Consult for warfarin Indication: VTE prophylaxis  No Known Allergies  Labs:  Basename 09/12/11 0620 09/11/11 0720 09/11/11 0551 09/10/11 0605 09/09/11 1302  HGB -- -- -- 10.0* 12.5  HCT -- -- -- 29.8* 36.9  PLT -- -- -- 145* 155  APTT -- -- -- -- --  LABPROT 15.0 14.3 -- -- --  INR 1.16 1.09 -- -- --  HEPARINUNFRC -- -- -- -- --  CREATININE 0.54 -- 0.58 0.58 --  CKTOTAL -- -- -- -- --  CKMB -- -- -- -- --  TROPONINI -- -- -- -- --    The CrCl is unknown because both a height and weight (above a minimum accepted value) are required for this calculation.   Assessment: 78 yo female is now POD#2 intramedullary nail femoral.  To SNF today INR increasing  Goal of Therapy:  INR 2-3 Monitor platelets by anticoagulation protocol: Yes   Plan:  1. Continue Coumadin 4 mg po daily at 1800 2. Follow-up INR for Coumadin dosing.   Thank you. Okey Regal, PharmD [phone]  09/12/2011,9:08 AM

## 2011-09-12 NOTE — Clinical Social Work Placement (Signed)
     Clinical Social Work Department CLINICAL SOCIAL WORK PLACEMENT NOTE 09/12/2011  Patient:  Elizabeth [name], Elizabeth [name]  Account Number:  [id number] Admit date:  09/09/2011  Clinical Social Worker:  Lupita Leash Maelin Kurkowski, BSW  Date/time:  09/12/2011 05:47 PM  Clinical Social Work is seeking post-discharge placement for this patient at the following level of care:   SKILLED NURSING   (*CSW will update this form in Epic as items are completed)   09/11/2011  Patient/family provided with [facility] Department of Clinical Social Works list of facilities offering this level of care within the geographic area requested by the patient (or if unable, by the patients family).  09/11/2011  Patient/family informed of their freedom to choose among providers that offer the needed level of care, that participate in Medicare, Medicaid or managed care program needed by the patient, have an available bed and are willing to accept the patient.  09/11/2011  Patient/family informed of [facility] ownership interest in [facility], as well as of the fact that they are under no obligation to receive care at this facility.  PASARR submitted to EDS on 09/11/2011 PASARR number received from EDS on 09/11/2011  FL2 transmitted to all facilities in geographic area requested by pt/family on  09/11/2011 FL2 transmitted to all facilities within larger geographic area on   Patient informed that his/her managed care company has contracts with or will negotiate with  certain facilities, including the following:   [employer]- [facility]- [facility]     Patient/family informed of bed offers received:  09/12/2011 Patient chooses bed at [facility] Physician recommends and patient chooses bed at    Patient to be transferred [facility] on  09/12/2011 Patient to be transferred to facility by ambulance  The following physician request were entered in Epic:   Additional Comments: Patient and nephew are pleased with d/c  plan.  Notified SNF and nursing of d/c.

## 2011-09-16 NOTE — Progress Notes (Signed)
Utilization review completed. Courtany Mcmurphy, RN, BSN. 

## 2013-01-20 ENCOUNTER — Encounter (HOSPITAL_COMMUNITY): Payer: Self-pay | Admitting: Emergency Medicine

## 2013-01-20 ENCOUNTER — Inpatient Hospital Stay (HOSPITAL_COMMUNITY)
Admission: EM | Admit: 2013-01-20 | Discharge: 2013-01-28 | DRG: 292 | Disposition: A | Discharge status: Skilled Nursing Facility | Payer: Medicare Other | Attending: Internal Medicine | Admitting: Internal Medicine

## 2013-01-20 ENCOUNTER — Emergency Department (HOSPITAL_COMMUNITY): Payer: Medicare Other

## 2013-01-20 DIAGNOSIS — R269 Unspecified abnormalities of gait and mobility: Secondary | ICD-10-CM

## 2013-01-20 DIAGNOSIS — I447 Left bundle-branch block, unspecified: Secondary | ICD-10-CM

## 2013-01-20 DIAGNOSIS — S72143A Displaced intertrochanteric fracture of unspecified femur, initial encounter for closed fracture: Secondary | ICD-10-CM

## 2013-01-20 DIAGNOSIS — I5023 Acute on chronic systolic (congestive) heart failure: Principal | ICD-10-CM | POA: Diagnosis present

## 2013-01-20 DIAGNOSIS — N390 Urinary tract infection, site not specified: Secondary | ICD-10-CM | POA: Diagnosis present

## 2013-01-20 DIAGNOSIS — I509 Heart failure, unspecified: Secondary | ICD-10-CM | POA: Diagnosis present

## 2013-01-20 DIAGNOSIS — IMO0002 Reserved for concepts with insufficient information to code with codable children: Secondary | ICD-10-CM

## 2013-01-20 DIAGNOSIS — A498 Other bacterial infections of unspecified site: Secondary | ICD-10-CM | POA: Diagnosis present

## 2013-01-20 DIAGNOSIS — M1710 Unilateral primary osteoarthritis, unspecified knee: Secondary | ICD-10-CM

## 2013-01-20 DIAGNOSIS — Z79899 Other long term (current) drug therapy: Secondary | ICD-10-CM

## 2013-01-20 DIAGNOSIS — E039 Hypothyroidism, unspecified: Secondary | ICD-10-CM | POA: Diagnosis present

## 2013-01-20 DIAGNOSIS — R609 Edema, unspecified: Secondary | ICD-10-CM

## 2013-01-20 DIAGNOSIS — F1010 Alcohol abuse, uncomplicated: Secondary | ICD-10-CM

## 2013-01-20 DIAGNOSIS — I10 Essential (primary) hypertension: Secondary | ICD-10-CM | POA: Diagnosis present

## 2013-01-20 DIAGNOSIS — E876 Hypokalemia: Secondary | ICD-10-CM | POA: Diagnosis present

## 2013-01-20 DIAGNOSIS — D62 Acute posthemorrhagic anemia: Secondary | ICD-10-CM

## 2013-01-20 DIAGNOSIS — F17200 Nicotine dependence, unspecified, uncomplicated: Secondary | ICD-10-CM | POA: Diagnosis present

## 2013-01-20 DIAGNOSIS — R0600 Dyspnea, unspecified: Secondary | ICD-10-CM

## 2013-01-20 DIAGNOSIS — K829 Disease of gallbladder, unspecified: Secondary | ICD-10-CM

## 2013-01-20 DIAGNOSIS — F341 Dysthymic disorder: Secondary | ICD-10-CM

## 2013-01-20 DIAGNOSIS — M810 Age-related osteoporosis without current pathological fracture: Secondary | ICD-10-CM

## 2013-01-20 DIAGNOSIS — R630 Anorexia: Secondary | ICD-10-CM

## 2013-01-20 DIAGNOSIS — R109 Unspecified abdominal pain: Secondary | ICD-10-CM

## 2013-01-20 LAB — BASIC METABOLIC PANEL WITH GFR
CO2: 26 meq/L (ref 19–32)
Chloride: 103 meq/L (ref 96–112)
GFR calc Af Amer: 90 mL/min — ABNORMAL LOW (ref >90)
Sodium: 139 meq/L (ref 135–145)

## 2013-01-20 LAB — CBC WITH DIFFERENTIAL/PLATELET
Basophils Absolute: 0.1 K/uL (ref 0.0–0.1)
Basophils Relative: 1 % (ref 0–1)
Eosinophils Absolute: 0.1 10*3/uL (ref 0.0–0.7)
Eosinophils Relative: 1 % (ref 0–5)
HCT: 37.9 % (ref 36.0–46.0)
Hemoglobin: 12.4 g/dL (ref 12.0–15.0)
Lymphocytes Relative: 11 % — ABNORMAL LOW (ref 12–46)
Lymphs Abs: 0.8 10*3/uL (ref 0.7–4.0)
MCH: 31.4 pg (ref 26.0–34.0)
MCHC: 32.7 g/dL (ref 30.0–36.0)
MCV: 95.9 fL (ref 78.0–100.0)
Monocytes Absolute: 0.7 10*3/uL (ref 0.1–1.0)
Monocytes Relative: 9 % (ref 3–12)
Neutro Abs: 6 10*3/uL (ref 1.7–7.7)
Neutrophils Relative %: 78 % — ABNORMAL HIGH (ref 43–77)
Platelets: ADEQUATE K/uL (ref 150–400)
RBC: 3.95 MIL/uL (ref 3.87–5.11)
RDW: 15.3 % (ref 11.5–15.5)
WBC: 7.7 K/uL (ref 4.0–10.5)

## 2013-01-20 LAB — URINALYSIS, ROUTINE W REFLEX MICROSCOPIC
Glucose, UA: NEGATIVE mg/dL
Ketones, ur: >80 mg/dL — AB
Nitrite: POSITIVE — AB
Protein, ur: 30 mg/dL — AB
Specific Gravity, Urine: 1.027 (ref 1.005–1.030)
Urobilinogen, UA: 1 mg/dL (ref 0.0–1.0)
pH: 5.5 (ref 5.0–8.0)

## 2013-01-20 LAB — PRO B NATRIURETIC PEPTIDE: Pro B Natriuretic peptide (BNP): 31867 pg/mL — ABNORMAL HIGH (ref 0–450)

## 2013-01-20 LAB — BASIC METABOLIC PANEL
BUN: 15 mg/dL (ref 6–23)
Calcium: 9 mg/dL (ref 8.4–10.5)
Creatinine, Ser: 0.65 mg/dL (ref 0.50–1.10)
GFR calc non Af Amer: 78 mL/min — ABNORMAL LOW (ref >90)
Glucose, Bld: 131 mg/dL — ABNORMAL HIGH (ref 70–99)
Potassium: 4.2 mEq/L (ref 3.5–5.1)

## 2013-01-20 LAB — PROTIME-INR
INR: 1.05 (ref 0.00–1.49)
Prothrombin Time: 13.5 seconds (ref 11.6–15.2)

## 2013-01-20 LAB — URINE MICROSCOPIC-ADD ON

## 2013-01-20 LAB — APTT: aPTT: 21 s — ABNORMAL LOW (ref 24–37)

## 2013-01-20 LAB — TROPONIN I: Troponin I: <0.3 ng/mL (ref <0.30)

## 2013-01-20 MED ORDER — FUROSEMIDE 10 MG/ML IJ SOLN
40.0000 mg | Freq: Once | INTRAMUSCULAR | Status: AC
  Administered 2013-01-20: 40 mg via INTRAVENOUS
  Filled 2013-01-20: qty 4

## 2013-01-20 NOTE — ED Notes (Signed)
Per ems-- pt has been coughing up white mucous for 2 weeks and 2 days of exertional sob. Pt with slight edema that is worse in last day takes lasix at home. Rhonchi at bases. 84% RA upon arrival. Pt on 4L [place].

## 2013-01-20 NOTE — ED Notes (Signed)
Dr Oletta Lamas in room

## 2013-01-20 NOTE — ED Provider Notes (Signed)
CSN: [id number]     Arrival date & time 01/20/13  2006 History   First MD Initiated Contact with Patient 01/20/13 2007     Chief Complaint  Patient presents with  . Shortness of Breath   (Consider location/radiation/quality/duration/timing/severity/associated sxs/prior Treatment) HPI Comments: EMS reports at home, sats were 84%, however here, they are in upper 90's on RA.  I question accuracy of pre-hospital reading.    Patient is a 79 y.o. female presenting with shortness of breath. The history is provided by the patient and the EMS personnel.  Shortness of Breath Severity:  Moderate Onset quality:  Gradual Duration:  2 weeks Timing:  Constant Progression:  Waxing and waning Chronicity:  New Context: weather changes   Context: not smoke exposure   Relieved by:  Nothing Worsened by:  Exertion and activity Ineffective treatments:  None tried Associated symptoms: cough and sputum production   Associated symptoms: no abdominal pain, no chest pain, no diaphoresis, no fever, no hemoptysis, no neck pain, no sore throat, no syncope and no vomiting   Cough:    Cough characteristics:  Productive   Sputum characteristics:  White   Severity:  Moderate   Onset quality:  Gradual   Duration:  2 weeks   Timing:  Intermittent   Progression:  Unchanged   Chronicity:  New Risk factors: no hx of cancer and no obesity   Risk factors comment:  Pt uses snuff and was exposed to second hand smoke   Past Medical History  Diagnosis Date  . Thyroid disease   . Hypertension    Past Surgical History  Procedure Laterality Date  . Cholecystectomy    . Femur im nail  09/09/2011    Procedure: INTRAMEDULLARY (IM) NAIL FEMORAL;  Surgeon: Kathryne Hitch, MD;  Location: [facility] OR;  Service: Orthopedics;  Laterality: Left;  distal interlocking screws   History reviewed. No pertinent family history. History  Substance Use Topics  . Smoking status: Never Smoker   . Smokeless tobacco: Current User     Types: Snuff  . Alcohol Use: Yes     Comment: rare   OB History   Grav Para Term Preterm Abortions TAB SAB Ect Mult Living                 Review of Systems  Constitutional: Negative for fever and diaphoresis.  HENT: Negative for congestion, rhinorrhea, sore throat and trouble swallowing.   Respiratory: Positive for cough, sputum production and shortness of breath. Negative for hemoptysis.   Cardiovascular: Negative for chest pain, leg swelling and syncope.  Gastrointestinal: Negative for vomiting and abdominal pain.  Musculoskeletal: Negative for neck pain.  All other systems reviewed and are negative.    Allergies  Review of patient's allergies indicates no known allergies.  Home Medications   Current Outpatient Rx  Name  Route  Sig  Dispense  Refill  . buPROPion (WELLBUTRIN) 75 MG tablet   Oral   Take 75 mg by mouth 2 (two) times daily.         . cloNIDine (CATAPRES) 0.1 MG tablet   Oral   Take 0.1 mg by mouth daily.         . furosemide (LASIX) 20 MG tablet   Oral   Take 20 mg by mouth daily.         Marland Kitchen levothyroxine (SYNTHROID, LEVOTHROID) 100 MCG tablet   Oral   Take 100 mcg by mouth daily before breakfast.         .  potassium chloride (K-DUR,KLOR-CON) 10 MEQ tablet   Oral   Take 10 mEq by mouth daily.          BP 139/70  Pulse 78  Temp(Src) 97.3 F (36.3 C) (Oral)  Resp 18  Ht 5\' 7"  (1.702 m)  Wt 123 lb (55.792 kg)  BMI 19.26 kg/m2  SpO2 98% Physical Exam  Nursing note and vitals reviewed. Constitutional: She is oriented to person, place, and time. She appears well-developed and well-nourished. No distress.  HENT:  Head: Normocephalic and atraumatic.  Eyes: Conjunctivae and EOM are normal. No scleral icterus.  Cardiovascular: Normal rate and intact distal pulses.   No murmur heard. Pulmonary/Chest: Effort normal. No accessory muscle usage. Not tachypneic. No respiratory distress. She has no wheezes. She has rhonchi. She has no rales.   Abdominal: Soft. She exhibits no distension. There is no tenderness. There is no rebound.  Neurological: She is alert and oriented to person, place, and time. She exhibits normal muscle tone. Coordination normal.  Skin: Skin is warm and dry. She is not diaphoretic.    ED Course  Procedures (including critical care time) Labs Review Labs Reviewed  CBC WITH DIFFERENTIAL - Abnormal; Notable for the following:    Neutrophils Relative % 78 (*)    Lymphocytes Relative 11 (*)    All other components within normal limits  BASIC METABOLIC PANEL - Abnormal; Notable for the following:    Glucose, Bld 131 (*)    GFR calc non Af Amer 78 (*)    GFR calc Af Amer 90 (*)    All other components within normal limits  APTT - Abnormal; Notable for the following:    aPTT 21 (*)    All other components within normal limits  PRO B NATRIURETIC PEPTIDE - Abnormal; Notable for the following:    Pro B Natriuretic peptide (BNP) 31867.0 (*)    All other components within normal limits  URINALYSIS, ROUTINE W REFLEX MICROSCOPIC - Abnormal; Notable for the following:    Color, Urine AMBER (*)    APPearance CLOUDY (*)    Hgb urine dipstick MODERATE (*)    Bilirubin Urine SMALL (*)    Ketones, ur >80 (*)    Protein, ur 30 (*)    Nitrite POSITIVE (*)    Leukocytes, UA LARGE (*)    All other components within normal limits  URINE MICROSCOPIC-ADD ON - Abnormal; Notable for the following:    Bacteria, UA MANY (*)    Crystals CA OXALATE CRYSTALS (*)    All other components within normal limits  URINE CULTURE  TROPONIN I  PROTIME-INR   Imaging Review Dg Chest Port 1 View  01/20/2013   CLINICAL DATA:  Shortness of breath.  EXAM: PORTABLE CHEST - 1 VIEW  COMPARISON:  09/09/2011  FINDINGS: Symmetric bilateral airspace disease is seen, consistent with pulmonary edema. Cardiomegaly is stable. Probable small right pleural effusion also noted.  IMPRESSION: Acute pulmonary edema, likely secondary to congestive heart  failure. Probable small right pleural effusion.   Electronically Signed   By: Myles Rosenthal M.D.   On: 01/20/2013 20:58    EKG Interpretation    Date/Time:  Thursday January 20 2013 20:15:51 EST Ventricular Rate:  84 PR Interval:  166 QRS Duration: 147 QT Interval:  414 QTC Calculation: 489 R Axis:   -15 Text Interpretation:  Sinus rhythm Atrial premature complexes Left bundle branch block PAC's are new PR interval has normalized Abnormal ECG Confirmed by [facility]  MD, MICHEAL [phone])  on 01/20/2013 8:58:31 PM            9:55 PM Pt is sustaining normal O2 sat at rest.  Nephew is here and providing more history.  Pt is on lasix 20 mg daily, likely more for peripheral edema.  No h/o CAD or CHF that he knows of.  Old records indicates admission in the past requiring O2 and an ECHO in 2010 shows an EF of 40-45%.  No cardiologist that family is aware of.  Will give IV lasix here, admit for worsening CHF.  UA is abn, no symptoms of UTI, culture added on and can be followed.     MDM   1. CHF (congestive heart failure)   2. Dyspnea      Pt is in no distress, RA sats are stable here, no sig wheezing.  No CP, but could be cardiac.  Will get troponin, CXR, labs including BNP.      Gavin Pound. Oletta Lamas, MD 01/20/13 2226

## 2013-01-21 ENCOUNTER — Inpatient Hospital Stay (HOSPITAL_COMMUNITY): Payer: Medicare Other

## 2013-01-21 DIAGNOSIS — I509 Heart failure, unspecified: Secondary | ICD-10-CM

## 2013-01-21 DIAGNOSIS — I10 Essential (primary) hypertension: Secondary | ICD-10-CM

## 2013-01-21 DIAGNOSIS — I059 Rheumatic mitral valve disease, unspecified: Secondary | ICD-10-CM

## 2013-01-21 DIAGNOSIS — E039 Hypothyroidism, unspecified: Secondary | ICD-10-CM

## 2013-01-21 HISTORY — DX: Heart failure, unspecified: I50.9

## 2013-01-21 LAB — COMPREHENSIVE METABOLIC PANEL
ALT: 21 U/L (ref 0–35)
AST: 19 U/L (ref 0–37)
Albumin: 3.3 g/dL — ABNORMAL LOW (ref 3.5–5.2)
Alkaline Phosphatase: 103 U/L (ref 39–117)
CO2: 26 mEq/L (ref 19–32)
Calcium: 8.9 mg/dL (ref 8.4–10.5)
GFR calc Af Amer: >90 mL/min (ref >90)
GFR calc non Af Amer: 78 mL/min — ABNORMAL LOW (ref >90)
Potassium: 3.2 mEq/L — ABNORMAL LOW (ref 3.5–5.1)
Sodium: 141 mEq/L (ref 135–145)
Total Bilirubin: 1 mg/dL (ref 0.3–1.2)
Total Protein: 6 g/dL (ref 6.0–8.3)

## 2013-01-21 LAB — CBC
HCT: 37.6 % (ref 36.0–46.0)
Hemoglobin: 12.7 g/dL (ref 12.0–15.0)
MCHC: 33.8 g/dL (ref 30.0–36.0)
MCV: 95.2 fL (ref 78.0–100.0)
WBC: 7.2 10*3/uL (ref 4.0–10.5)

## 2013-01-21 LAB — PROTIME-INR
INR: 1.06 (ref 0.00–1.49)
Prothrombin Time: 13.6 seconds (ref 11.6–15.2)

## 2013-01-21 LAB — PRO B NATRIURETIC PEPTIDE: Pro B Natriuretic peptide (BNP): 34451 pg/mL — ABNORMAL HIGH (ref 0–450)

## 2013-01-21 LAB — TSH: TSH: 1.094 u[IU]/mL (ref 0.350–4.500)

## 2013-01-21 LAB — TROPONIN I: Troponin I: <0.3 ng/mL (ref <0.30)

## 2013-01-21 MED ORDER — FUROSEMIDE 10 MG/ML IJ SOLN
20.0000 mg | Freq: Once | INTRAMUSCULAR | Status: AC
  Administered 2013-01-21: 20 mg via INTRAVENOUS
  Filled 2013-01-21: qty 2

## 2013-01-21 MED ORDER — POTASSIUM CHLORIDE CRYS ER 20 MEQ PO TBCR
40.0000 meq | EXTENDED_RELEASE_TABLET | Freq: Once | ORAL | Status: AC
  Administered 2013-01-21: 40 meq via ORAL
  Filled 2013-01-21: qty 2

## 2013-01-21 MED ORDER — HEPARIN SODIUM (PORCINE) 5000 UNIT/ML IJ SOLN
5000.0000 [IU] | Freq: Three times a day (TID) | INTRAMUSCULAR | Status: DC
  Administered 2013-01-21 – 2013-01-28 (×22): 5000 [IU] via SUBCUTANEOUS
  Filled 2013-01-21 (×25): qty 1

## 2013-01-21 MED ORDER — ONDANSETRON HCL 4 MG PO TABS: 4.0000 mg | ORAL_TABLET | Freq: Four times a day (QID) | ORAL | Status: DC | PRN

## 2013-01-21 MED ORDER — FUROSEMIDE 10 MG/ML IJ SOLN
20.0000 mg | Freq: Two times a day (BID) | INTRAMUSCULAR | Status: DC
  Administered 2013-01-21 – 2013-01-25 (×8): 20 mg via INTRAVENOUS
  Filled 2013-01-21 (×8): qty 2

## 2013-01-21 MED ORDER — CLONIDINE HCL 0.1 MG PO TABS
0.1000 mg | ORAL_TABLET | Freq: Every day | ORAL | Status: DC
  Administered 2013-01-21 – 2013-01-27 (×7): 0.1 mg via ORAL
  Filled 2013-01-21 (×8): qty 1

## 2013-01-21 MED ORDER — DEXTROSE 5 % IV SOLN
1.0000 g | INTRAVENOUS | Status: DC
  Administered 2013-01-21 – 2013-01-24 (×4): 1 g via INTRAVENOUS
  Filled 2013-01-21 (×4): qty 10

## 2013-01-21 MED ORDER — ACETAMINOPHEN 650 MG RE SUPP: 650.0000 mg | Freq: Four times a day (QID) | RECTAL | Status: DC | PRN

## 2013-01-21 MED ORDER — ALPRAZOLAM 0.25 MG PO TABS
0.2500 mg | ORAL_TABLET | Freq: Once | ORAL | Status: AC
  Administered 2013-01-21: 0.25 mg via ORAL
  Filled 2013-01-21: qty 1

## 2013-01-21 MED ORDER — ONDANSETRON HCL 4 MG/2ML IJ SOLN: 4.0000 mg | Freq: Four times a day (QID) | INTRAMUSCULAR | Status: DC | PRN

## 2013-01-21 MED ORDER — SODIUM CHLORIDE 0.9 % IJ SOLN
3.0000 mL | Freq: Two times a day (BID) | INTRAMUSCULAR | Status: DC
  Administered 2013-01-21 – 2013-01-28 (×16): 3 mL via INTRAVENOUS

## 2013-01-21 MED ORDER — POTASSIUM CHLORIDE 20 MEQ PO PACK
40.0000 meq | PACK | Freq: Once | ORAL | Status: DC
Start: 2013-01-21 — End: 2013-01-21
  Filled 2013-01-21: qty 2

## 2013-01-21 MED ORDER — ACETAMINOPHEN 325 MG PO TABS
650.0000 mg | ORAL_TABLET | Freq: Four times a day (QID) | ORAL | Status: DC | PRN
  Administered 2013-01-21 – 2013-01-27 (×8): 650 mg via ORAL
  Filled 2013-01-21 (×8): qty 2

## 2013-01-21 MED ORDER — POTASSIUM CHLORIDE CRYS ER 20 MEQ PO TBCR
20.0000 meq | EXTENDED_RELEASE_TABLET | Freq: Two times a day (BID) | ORAL | Status: DC
  Administered 2013-01-21 – 2013-01-23 (×6): 20 meq via ORAL
  Filled 2013-01-21 (×10): qty 1

## 2013-01-21 MED ORDER — LEVOTHYROXINE SODIUM 100 MCG PO TABS
100.0000 ug | ORAL_TABLET | Freq: Every day | ORAL | Status: DC
  Administered 2013-01-21 – 2013-01-28 (×8): 100 ug via ORAL
  Filled 2013-01-21 (×9): qty 1

## 2013-01-21 MED ORDER — INFLUENZA VAC SPLIT QUAD 0.5 ML IM SUSP
0.5000 mL | INTRAMUSCULAR | Status: AC
  Administered 2013-01-22: 0.5 mL via INTRAMUSCULAR
  Filled 2013-01-21: qty 0.5

## 2013-01-21 MED ORDER — ALBUTEROL SULFATE (5 MG/ML) 0.5% IN NEBU
2.5000 mg | INHALATION_SOLUTION | Freq: Four times a day (QID) | RESPIRATORY_TRACT | Status: DC | PRN
  Filled 2013-01-21: qty 0.5

## 2013-01-21 NOTE — Progress Notes (Signed)
  Echocardiogram 2D Echocardiogram has been performed.  Arvil Chaco 01/21/2013, 2:47 PM

## 2013-01-21 NOTE — Progress Notes (Signed)
Utilization Review Completed Kasheem Toner J. Davontay Watlington, RN, BSN, NCM [phone]  

## 2013-01-21 NOTE — Progress Notes (Signed)
Pt c/o difficulty breathing.  Resp 20/min.  02 sat = 98% on room air.  02 applied at 2L per [place]. Pt also requested medicine for "nerves."  Dr. York Spaniel informed of above and 1 time dose xanax 0.25 ordered and prn breathing treatments.

## 2013-01-21 NOTE — Progress Notes (Signed)
[facility] PROGRESS NOTE  NGOC DETJEN ZOX:[id number] DOB: 03-23-1923 DOA: 01/20/2013 PCP: Aida Puffer, MD  Assessment/Plan: 79 y/o female with PMH of hypothyroidism, HTN, CHF presented with SOB, DOE admitted with CHF   1. Acute on chronic CHF, systolic HF; echo (2010): LVEF 40%; CXR: Acute pulmonary edema -cont IV diuresis; daily weight, I/O; close monitor, pend echo  2. Hypo K; replace, recheck in AM   3. Hypothyroidism, TSH 1.0; cont levothyroxine;   4. UTI, cont atx; f/u c/s;   Code Status: full Family Communication: d/w patient  (indicate person spoken with, relationship, and if by phone, the number) Disposition Plan: home in 24-48 hours    Consultants:  None   Procedures:  CXR  Antibiotics:  cefrtiaxone 12/18<<<< (indicate start date, and stop date if known)  HPI/Subjective: alert  Objective: Filed Vitals:   01/21/13 1041  BP: 122/74  Pulse: 94  Temp: 98 F (36.7 C)  Resp: 16    Intake/Output Summary (Last 24 hours) at 01/21/13 1114 Last data filed at 01/21/13 0952  Gross per 24 hour  Intake    460 ml  Output    845 ml  Net   -385 ml   Filed Weights   01/20/13 2013 01/21/13 0109  Weight: 55.792 kg (123 lb) 52.254 kg (115 lb 3.2 oz)    Exam:   General:  alert  Cardiovascular: s1,s2 rrr  Respiratory: LL crackles   Abdomen: soft, nt, nd   Musculoskeletal: mild edema    Data Reviewed: Basic Metabolic Panel:  Recent Labs Lab 01/20/13 2021 01/21/13 0230  NA 139 141  K 4.2 3.2*  CL 103 102  CO2 26 26  GLUCOSE 131* 141*  BUN 15 14  CREATININE 0.65 0.63  CALCIUM 9.0 8.9   Liver Function Tests:  Recent Labs Lab 01/21/13 0230  AST 19  ALT 21  ALKPHOS 103  BILITOT 1.0  PROT 6.0  ALBUMIN 3.3*   No results found for this basename: LIPASE, AMYLASE,  in the last 168 hours No results found for this basename: AMMONIA,  in the last 168 hours CBC:  Recent Labs Lab 01/20/13 2021 01/21/13 0230  WBC 7.7 7.2   NEUTROABS 6.0  --   HGB 12.4 12.7  HCT 37.9 37.6  MCV 95.9 95.2  PLT PLATELET CLUMPS NOTED ON SMEAR, COUNT APPEARS ADEQUATE 169   Cardiac Enzymes:  Recent Labs Lab 01/20/13 2021 01/21/13 0230 01/21/13 0902  TROPONINI <0.30 <0.30 <0.30   BNP (last 3 results)  Recent Labs  01/20/13 2021 01/21/13 0902  PROBNP 31867.0* 34451.0*   CBG: No results found for this basename: GLUCAP,  in the last 168 hours  No results found for this or any previous visit (from the past 240 hour(s)).   Studies: Dg Chest Port 1 View  01/20/2013   CLINICAL DATA:  Shortness of breath.  EXAM: PORTABLE CHEST - 1 VIEW  COMPARISON:  09/09/2011  FINDINGS: Symmetric bilateral airspace disease is seen, consistent with pulmonary edema. Cardiomegaly is stable. Probable small right pleural effusion also noted.  IMPRESSION: Acute pulmonary edema, likely secondary to congestive heart failure. Probable small right pleural effusion.   Electronically Signed   By: Myles Rosenthal M.D.   On: 01/20/2013 20:58    Scheduled Meds: . cefTRIAXone (ROCEPHIN)  IV  1 g Intravenous Q24H  . cloNIDine  0.1 mg Oral Daily  . heparin  5,000 Units Subcutaneous Q8H  . [START ON 01/22/2013] influenza vac split quadrivalent PF  0.5 mL Intramuscular Tomorrow-1000  .  levothyroxine  100 mcg Oral QAC breakfast  . sodium chloride  3 mL Intravenous Q12H   Continuous Infusions:   Principal Problem:   Acute CHF (congestive heart failure) Active Problems:   HYPOTHYROIDISM   HYPERTENSION   UTI    Time spent: >35 minutes     [name]  [facility] Pager [phone]. If 7PM-7AM, please contact night-coverage at [email or web address], password [facility] 01/21/2013, 11:14 AM  LOS: 1 day

## 2013-01-21 NOTE — Progress Notes (Signed)
One time dose xanax 0.25 po and lasix 20 mg IV both given.

## 2013-01-21 NOTE — H&P (Signed)
[facility] History and Physical  Patient: Elizabeth [name]  ZOX:[id number]  DOB: [birth year]  DOS: the patient was seen and examined on 01/21/2013 PCP: Aida Puffer, MD  Chief Complaint: Shortness of breath  HPI: Elizabeth [name] is a 79 y.o. female with Past medical history of hypertension hypothyroidism . The patient is coming from home. The history has been obtained from ED physician as well as EMS documentation, as the patient appears poor historian. At present the patient is denying any complaints of shortness of breath, chest pain, fever, chills, nausea, vomiting, abdominal pain, leg swelling. she does mention about orthopnea and PND. She also complains of pain when she urinates. She mentions he has chronic leg swelling and which is not changing. She has been walking with walker but mentioned that today she felt significantly weak and tired and therefore she was not able to walk around and had a near fall due to her legs giving away. She denies any focal neurological deficit at present as well as headache. She did complain of cough and sputum production to the edition associated with shortness of breath on exertion which has been ongoing since last 2 weeks. There was no fever. She complains of pain in her right hip  Review of Systems: as mentioned in the history of present illness.  A Comprehensive review of the other systems is negative.  Past Medical History  Diagnosis Date  . Thyroid disease   . Hypertension    Past Surgical History  Procedure Laterality Date  . Cholecystectomy    . Femur im nail  09/09/2011    Procedure: INTRAMEDULLARY (IM) NAIL FEMORAL;  Surgeon: Kathryne Hitch, MD;  Location: [facility] OR;  Service: Orthopedics;  Laterality: Left;  distal interlocking screws   Social History:  reports that she has never smoked. Her smokeless tobacco use includes Snuff. She reports that she drinks alcohol. She reports that she does not use illicit drugs. Independent  for most of her  ADL.  No Known Allergies  History reviewed. No pertinent family history.  Prior to Admission medications   Medication Sig Start Date End Date Taking? Authorizing Provider  buPROPion (WELLBUTRIN) 75 MG tablet Take 75 mg by mouth 2 (two) times daily.   Yes Historical Provider, MD  cloNIDine (CATAPRES) 0.1 MG tablet Take 0.1 mg by mouth daily.   Yes Historical Provider, MD  furosemide (LASIX) 20 MG tablet Take 20 mg by mouth daily.   Yes Historical Provider, MD  levothyroxine (SYNTHROID, LEVOTHROID) 100 MCG tablet Take 100 mcg by mouth daily before breakfast.   Yes Historical Provider, MD  potassium chloride (K-DUR,KLOR-CON) 10 MEQ tablet Take 10 mEq by mouth daily.   Yes Historical Provider, MD    Physical Exam: Filed Vitals:   01/20/13 2130 01/20/13 2215 01/20/13 2300 01/21/13 0015  BP: 139/70 140/69 140/80 139/70  Pulse: 78 77 80 78  Temp:      TempSrc:      Resp: 18 14 17 22   Height:      Weight:      SpO2: 98% 100% 98% 96%    General: Alert, Awake and Oriented to Time, Place and Person. Appear in mild distress Eyes: PERRL ENT: Oral Mucosa clear moist Neck: Minimal JVD Cardiovascular: S1 and S2 Present, aortic systolic Murmur, Peripheral Pulses Present Respiratory: Bilateral Air entry equal and Decreased, basal Crackles, no wheezes Abdomen: Bowel Sound Present, Soft and Non tender Skin: no Rash Extremities: Bilateral Pedal edema, no calf tenderness, right hip tenderness on trochanter  Neurologic: Grossly Unremarkable.  Labs on Admission:  CBC:  Recent Labs Lab 01/20/13 2021  WBC 7.7  NEUTROABS 6.0  HGB 12.4  HCT 37.9  MCV 95.9  PLT PLATELET CLUMPS NOTED ON SMEAR, COUNT APPEARS ADEQUATE    CMP     Component Value Date/Time   NA 139 01/20/2013 2021   K 4.2 01/20/2013 2021   CL 103 01/20/2013 2021   CO2 26 01/20/2013 2021   GLUCOSE 131* 01/20/2013 2021   BUN 15 01/20/2013 2021   CREATININE 0.65 01/20/2013 2021   CALCIUM 9.0 01/20/2013 2021    GFRNONAA 78* 01/20/2013 2021   GFRAA 90* 01/20/2013 2021    No results found for this basename: LIPASE, AMYLASE,  in the last 168 hours No results found for this basename: AMMONIA,  in the last 168 hours   Recent Labs Lab 01/20/13 2021  TROPONINI <0.30   BNP (last 3 results)  Recent Labs  01/20/13 2021  PROBNP 16109.6*    Radiological Exams on Admission: Dg Chest Port 1 View  01/20/2013   CLINICAL DATA:  Shortness of breath.  EXAM: PORTABLE CHEST - 1 VIEW  COMPARISON:  09/09/2011  FINDINGS: Symmetric bilateral airspace disease is seen, consistent with pulmonary edema. Cardiomegaly is stable. Probable small right pleural effusion also noted.  IMPRESSION: Acute pulmonary edema, likely secondary to congestive heart failure. Probable small right pleural effusion.   Electronically Signed   By: Myles Rosenthal M.D.   On: 01/20/2013 20:58    EKG: Independently reviewed. Old LBBB, PAC's noted.  Assessment/Plan Principal Problem:   Acute CHF (congestive heart failure) Active Problems:   HYPOTHYROIDISM   HYPERTENSION   UTI   1. Acute CHF (congestive heart failure) The patient is presenting with complaints of shortness of breath with cough with orthopnea and PND and leg swelling. She has elevated proBNP, she does not have any EKG changes suggestive of ischemia and her troponin levels are negative. She does not appear to have anemia as well. With this she will be treated for acute decompensated heart failure the etiology is unclear. We will get an echocardiogram in the morning. Serial troponins, telemetry. Ins and outs and daily weight  2. Possible UTI She complains of pain while urination In her urine is suggestive of possible pyuria Would cover her with IV ceftriaxone  3. Hypothyroidism Checking TSH in the morning Continue Synthroid  4. Hypertension Continue clonidine  5. Right hip pain Portable x-ray of the hip. At present no acute fracture seen on exam.  DVT  Prophylaxis: subcutaneous Heparin Nutrition: Cardiac diet  Code Status: Full  Disposition: Admitted to inpatient in telemetry unit.  Author: Lynden Oxford, MD [facility] Pager: [phone] 01/21/2013, 1:06 AM    If 7PM-7AM, please contact night-coverage [email or web address] Password [email or web address]

## 2013-01-21 NOTE — Care Management Note (Signed)
    Page 1 of 2   01/21/2013     11:26:02 AM   CARE MANAGEMENT NOTE 01/21/2013  Patient:  LENNIS, RADER   Account Number:  [id number]  Date Initiated:  01/21/2013  Documentation initiated by:  Oletta Cohn  Subjective/Objective Assessment:   79 yo female with PMH of hypothyroidism, HTN, CHF presented with SOB, DOE admitted with CHF //Home with Nephew     Action/Plan:   Diurese//Home with HH   Anticipated DC Date:  01/24/2013   Anticipated DC Plan:  HOME W HOME HEALTH SERVICES      DC Planning Services  CM consult  PCP issues      [facility] Choice  HOME HEALTH   Choice offered to / List presented to:  C-4 Adult Children        HH arranged  HH-1 RN  HH-10 DISEASE MANAGEMENT      HH agency  [facility]   Status of service:  In process, will continue to follow Medicare Important Message given?   (If response is "NO", the following Medicare IM given date fields will be blank) Date Medicare IM given:   Date Additional Medicare IM given:    Discharge Disposition:    Per UR Regulation:    If discussed at Long Length of Stay Meetings, dates discussed:    Comments:  01/21/13 1045 Oletta Cohn, RN, BSN, [employer] [phone] Spoke with pt and nephew at bedside regarding discharge planning for Home Health Services. Offered pt list of home health agencies to choose from.  Pt chose [facility] to render services of RN and Telemonitoring. Ayesha Rumpf, RN of [facility] notified.  No DME needs identified at this time.  Face to face orders requested.

## 2013-01-22 DIAGNOSIS — N390 Urinary tract infection, site not specified: Secondary | ICD-10-CM

## 2013-01-22 LAB — BASIC METABOLIC PANEL
BUN: 17 mg/dL (ref 6–23)
Calcium: 8.6 mg/dL (ref 8.4–10.5)
Creatinine, Ser: 0.64 mg/dL (ref 0.50–1.10)
GFR calc Af Amer: >90 mL/min (ref >90)
GFR calc non Af Amer: 78 mL/min — ABNORMAL LOW (ref >90)
Glucose, Bld: 104 mg/dL — ABNORMAL HIGH (ref 70–99)
Potassium: 4.4 mEq/L (ref 3.5–5.1)

## 2013-01-22 LAB — URINE CULTURE: Colony Count: >=100000

## 2013-01-22 MED ORDER — LORAZEPAM 0.5 MG PO TABS
0.5000 mg | ORAL_TABLET | Freq: Once | ORAL | Status: AC
  Administered 2013-01-22: 0.5 mg via ORAL
  Filled 2013-01-22: qty 1

## 2013-01-22 NOTE — Evaluation (Signed)
Physical Therapy Evaluation Patient Details Name: Elizabeth [name] MRN: [id number] DOB: 07-29-1923 Today's Date: 01/22/2013 Time: 0729-0749 PT Time Calculation (min): 20 min  PT Assessment / Plan / Recommendation History of Present Illness  admission for SOB, acute CHF  Clinical Impression  Pt pleasant but requires assist and supervision for mobility and is alone during the day while nephew is at work. Pt lives on second floor requires assist for ascend/descend stairs and has no phone or life alert in the event of an emergency. Pt with above safety concerns as well as below deficits and will benefit from acute therapy to maximize mobility and safety prior to discharge to increase independence. Recommend SNF at discharge.     PT Assessment  Patient needs continued PT services    Follow Up Recommendations  SNF;Supervision/Assistance - 24 hour    Does the patient have the potential to tolerate intense rehabilitation      Barriers to Discharge Decreased caregiver support      Equipment Recommendations  None recommended by PT    Recommendations for Other Services OT consult   Frequency Min 3X/week    Precautions / Restrictions Precautions Precautions: Fall   Pertinent Vitals/Pain Mild 2/10 right hip pain 98% RA at rest in bed, unable to get pulse ox reading the rest of session and reapplied 2L end of session HR 101      Mobility  Bed Mobility Bed Mobility: Rolling Right;Right Sidelying to Sit;Sitting - Scoot to Edge of Bed Rolling Right: 5: Supervision;With rail Right Sidelying to Sit: 4: Min guard Sitting - Scoot to [place] of Bed: 5: Supervision Details for Bed Mobility Assistance: cueing for sequence to ease transition to sitting with slight tactile assist to sitting Transfers Transfers: Sit to Stand;Stand to Sit Sit to Stand: 5: Supervision;From bed Stand to Sit: 4: Min assist;To chair/3-in-1 Details for Transfer Assistance: cueing for hand placement and safety with  assist to control descent Ambulation/Gait Ambulation/Gait Assistance: 5: Supervision Ambulation Distance (Feet): 30 Feet Assistive device: Rolling walker Ambulation/Gait Assistance Details: cueing for position in RW Gait Pattern: Step-through pattern;Decreased stride length Gait velocity: 10'/28 sec= .36 ft/sec indicative of high fall risk Stairs: No    Exercises     PT Diagnosis: Difficulty walking  PT Problem List: Decreased strength;Decreased cognition;Decreased activity tolerance;Decreased mobility;Decreased safety awareness PT Treatment Interventions: Gait training;Stair training;Functional mobility training;Therapeutic activities;Therapeutic exercise;Patient/family education;DME instruction     PT Goals(Current goals can be found in the care plan section) Acute Rehab PT Goals Patient Stated Goal: be able to return home PT Goal Formulation: With patient Time For Goal Achievement: 02/05/13 Potential to Achieve Goals: Fair  Visit Information  Last PT Received On: 01/22/13 Assistance Needed: +1 History of Present Illness: admission for SOB, acute CHF       Prior Functioning  Home Living Family/patient expects to be discharged to:: Private residence Living Arrangements: Other relatives Available Help at Discharge: Available PRN/intermittently Type of Home: Apartment Home Access: Stairs to enter [employer] of Steps: 14 Entrance Stairs-Rails: Right Home Layout: One level Home Equipment: Walker - 2 wheels;Bedside commode;Wheelchair - manual;Shower seat Prior Function Level of Independence: Needs assistance Gait / Transfers Assistance Needed: pt states she always uses RW ADL's / Homemaking Assistance Needed: nephew does all housework and driving, he assists pt with stairs performs bathing and dressing without assist  Communication Communication: HOH    Cognition  Cognition Arousal/Alertness: Awake/alert Behavior During Therapy: WFL for tasks  assessed/performed Overall Cognitive Status: Impaired/Different from baseline Area of  Impairment: Safety/judgement Safety/Judgement: Decreased awareness of safety;Decreased awareness of deficits General Comments: Pt unable to state what she would do if there were a fire but does state there is no phone in the home when she is alone    Extremity/Trunk Assessment Upper Extremity Assessment Upper Extremity Assessment: Generalized weakness Lower Extremity Assessment Lower Extremity Assessment: Generalized weakness Cervical / Trunk Assessment Cervical / Trunk Assessment: Normal   Balance    End of Session PT - End of Session Equipment Utilized During Treatment: Gait belt Activity Tolerance: Patient tolerated treatment well Patient left: in chair;with call bell/phone within reach Nurse Communication: Mobility status  GP     Elizabeth [name] 01/22/2013, 8:05 AM Elizabeth [name], PT [phone]

## 2013-01-22 NOTE — Progress Notes (Signed)
Clinical Social Work Department CLINICAL SOCIAL WORK PLACEMENT NOTE 01/22/2013  Patient:  Elizabeth [name], Elizabeth [name]  Account Number:  [id number] Admit date:  01/20/2013  Clinical Social Worker:  Sharol Harness, Theresia Majors  Date/time:  01/22/2013 03:00 PM  Clinical Social Work is seeking post-discharge placement for this patient at the following level of care:   SKILLED NURSING   (*CSW will update this form in Epic as items are completed)   01/22/2013  Patient/family provided with [facility] Department of Clinical Social Work's list of facilities offering this level of care within the geographic area requested by the patient (or if unable, by the patient's family).  01/22/2013  Patient/family informed of their freedom to choose among providers that offer the needed level of care, that participate in Medicare, Medicaid or managed care program needed by the patient, have an available bed and are willing to accept the patient.  01/22/2013  Patient/family informed of [facility]' ownership interest in [facility], as well as of the fact that they are under no obligation to receive care at this facility.  PASARR submitted to EDS on 01/22/2013 PASARR number received from EDS on 01/22/2013  FL2 transmitted to all facilities in geographic area requested by pt/family on  01/22/2013 FL2 transmitted to all facilities within larger geographic area on   Patient informed that his/her managed care company has contracts with or will negotiate with  certain facilities, including the following:     Patient/family informed of bed offers received:   Patient chooses bed at  Physician recommends and patient chooses bed at    Patient to be transferred to  on   Patient to be transferred to facility by   The following physician request were entered in Epic:   Additional CommentsSharol Harness, LCSWA [phone]

## 2013-01-22 NOTE — Progress Notes (Addendum)
[facility] PROGRESS NOTE  LEAN JAEGER OZH:[id number] DOB: [birth year] DOA: 01/20/2013 PCP: Aida Puffer, MD  Assessment/Plan: 79 y/o female with PMH of hypothyroidism, HTN, CHF presented with SOB, DOE admitted with CHF   1. Acute on chronic CHF, systolic HF; echo (2010): LVEF 40%; CXR: Acute pulmonary edema -repeat echo (01/21/13): LVEF 20% to 25%. Diffuse hypokinesis -cont IV diuresis; daily weight, I/O; cardiology eval for ? LHC  2. Hypo K; replace,    3. Hypothyroidism, TSH 1.0; cont levothyroxine;   4. UTI, cont atx; f/u c/s;   Code Status: full Family Communication: d/w patient, called [name],[name] Nephew [phone] no answer  (indicate person spoken with, relationship, and if by phone, the number) Disposition Plan: home in 24-48 hours    Consultants:  None   Procedures:  CXR  Antibiotics:  cefrtiaxone 12/18<<<< (indicate start date, and stop date if known)  HPI/Subjective: alert  Objective: Filed Vitals:   01/22/13 0924  BP: 133/68  Pulse: 89  Temp: 97.3 F (36.3 C)  Resp:     Intake/Output Summary (Last 24 hours) at 01/22/13 1105 Last data filed at 01/22/13 1033  Gross per 24 hour  Intake    685 ml  Output   2300 ml  Net  -1615 ml   Filed Weights   01/20/13 2013 01/21/13 0109 01/22/13 0500  Weight: 55.792 kg (123 lb) 52.254 kg (115 lb 3.2 oz) 52.617 kg (116 lb)    Exam:   General:  alert  Cardiovascular: s1,s2 rrr  Respiratory: LL crackles   Abdomen: soft, nt, nd   Musculoskeletal: mild edema    Data Reviewed: Basic Metabolic Panel:  Recent Labs Lab 01/20/13 2021 01/21/13 0230 01/22/13 0440  NA 139 141 141  K 4.2 3.2* 4.4  CL 103 102 102  CO2 26 26 29   GLUCOSE 131* 141* 104*  BUN 15 14 17   CREATININE 0.65 0.63 0.64  CALCIUM 9.0 8.9 8.6   Liver Function Tests:  Recent Labs Lab 01/21/13 0230  AST 19  ALT 21  ALKPHOS 103  BILITOT 1.0  PROT 6.0  ALBUMIN 3.3*   No results found for this basename: LIPASE,  AMYLASE,  in the last 168 hours No results found for this basename: AMMONIA,  in the last 168 hours CBC:  Recent Labs Lab 01/20/13 2021 01/21/13 0230  WBC 7.7 7.2  NEUTROABS 6.0  --   HGB 12.4 12.7  HCT 37.9 37.6  MCV 95.9 95.2  PLT PLATELET CLUMPS NOTED ON SMEAR, COUNT APPEARS ADEQUATE 169   Cardiac Enzymes:  Recent Labs Lab 01/20/13 2021 01/21/13 0230 01/21/13 0902 01/21/13 1240  TROPONINI <0.30 <0.30 <0.30 <0.30   BNP (last 3 results)  Recent Labs  01/20/13 2021 01/21/13 0902  PROBNP 31867.0* 34451.0*   CBG: No results found for this basename: GLUCAP,  in the last 168 hours  Recent Results (from the past 240 hour(s))  URINE CULTURE     Status: None   Collection Time    01/20/13  9:06 PM      Result Value Range Status   Specimen Description URINE, CLEAN CATCH   Final   Special Requests NONE   Final   Culture  Setup Time     Final   Value: 01/20/2013 22:28     Performed at [employer] [facility] Count     Final   Value: >=100,000 COLONIES/ML     Performed at [facility]   Culture     Final  Value: ESCHERICHIA COLI     Performed at [facility]   Report Status 01/22/2013 FINAL   Final   Organism ID, Bacteria ESCHERICHIA COLI   Final     Studies: Dg Hip Complete Right  01/21/2013   CLINICAL DATA:  Pain in right hip.  EXAM: RIGHT HIP - COMPLETE 2+ VIEW  COMPARISON:  Left femur radiographs 09/09/2011.  FINDINGS: The bones are demineralized. There is no evidence of acute fracture or dislocation. Left proximal femoral dynamic screw and intra medullary nail are noted with probable posttraumatic deformity of the left pubic rami. Degenerated uterine fibroids and diffuse vascular calcifications are noted.  IMPRESSION: 1. No evidence of acute right femur fracture or dislocation. If the patient has persistent hip pain or inability to bear weight, follow up imaging may be warranted as hip fractures can be radiographically occult in the elderly.  2. Osteopenia with old pubic rami fractures on the left.   Electronically Signed   By: Roxy Horseman M.D.   On: 01/21/2013 18:32   Dg Chest Port 1 View  01/20/2013   CLINICAL DATA:  Shortness of breath.  EXAM: PORTABLE CHEST - 1 VIEW  COMPARISON:  09/09/2011  FINDINGS: Symmetric bilateral airspace disease is seen, consistent with pulmonary edema. Cardiomegaly is stable. Probable small right pleural effusion also noted.  IMPRESSION: Acute pulmonary edema, likely secondary to congestive heart failure. Probable small right pleural effusion.   Electronically Signed   By: Myles Rosenthal M.D.   On: 01/20/2013 20:58    Scheduled Meds: . cefTRIAXone (ROCEPHIN)  IV  1 g Intravenous Q24H  . cloNIDine  0.1 mg Oral Daily  . furosemide  20 mg Intravenous BID  . heparin  5,000 Units Subcutaneous Q8H  . levothyroxine  100 mcg Oral QAC breakfast  . potassium chloride  20 mEq Oral BID  . sodium chloride  3 mL Intravenous Q12H   Continuous Infusions:   Principal Problem:   Acute CHF (congestive heart failure) Active Problems:   HYPOTHYROIDISM   HYPERTENSION   UTI    Time spent: >35 minutes     [name]  [facility] Pager [phone]. If 7PM-7AM, please contact night-coverage at [email or web address], password [facility] 01/22/2013, 11:05 AM  LOS: 2 days

## 2013-01-22 NOTE — Progress Notes (Addendum)
Clinical Social Work Department BRIEF PSYCHOSOCIAL ASSESSMENT 01/22/2013  Patient:  Elizabeth [name], Elizabeth [name]     Account Number:  [id number]     Admit date:  01/20/2013  Clinical Social Worker:  Harless Nakayama  Date/Time:  01/22/2013 02:45 PM  Referred by:  Physician  Date Referred:  01/22/2013 Referred for  SNF Placement   Other Referral:   Interview type:  Patient Other interview type:    PSYCHOSOCIAL DATA Living Status:  FAMILY Admitted from facility:   Level of care:   Primary support name:  Celene Skeen Primary support relationship to patient:  FAMILY Degree of support available:   Pt has support of nephew    CURRENT CONCERNS Current Concerns  Post-Acute Placement  Post-Acute Placement   Other Concerns:    SOCIAL WORK ASSESSMENT / PLAN CSW spoke with pt about PT recommendation. Pt says she was unaware of recommendation and that she would be staying in the hospital to do "everything". CSW explained that pt would stay at the hospital until she was medically stable and then could go to SNF for ST rehab. Pt was understanding but stated she would like to speak with her nephew about it first. CSW explained SNF referral process to pt. Pt is agreeable to CSW making referral to [facility] SNFs so that pt and pt nephew will have facility options if needed.    CSW attempted to contact pt nephew however number on facesheet does not accept incoming calls. Pt is unable to remember phone number at this time.  Pt will require insurance auth prior to going to SNF. CSW has sent clinicals for auth request.   Assessment/plan status:  Psychosocial Support/Ongoing Assessment of Needs Other assessment/ plan:   Information/referral to community resources:   Will provide SNF list with bed offers.    PATIENT'S/FAMILY'S RESPONSE TO PLAN OF CARE: Pt is agreeable to being referred out to SNF but unsure of dc plan at this time.       Karlis Cregg, LCSWA [phone]

## 2013-01-23 DIAGNOSIS — I10 Essential (primary) hypertension: Secondary | ICD-10-CM

## 2013-01-23 LAB — BASIC METABOLIC PANEL
Calcium: 8.5 mg/dL (ref 8.4–10.5)
Chloride: 100 mEq/L (ref 96–112)
Creatinine, Ser: 0.58 mg/dL (ref 0.50–1.10)
GFR calc Af Amer: >90 mL/min (ref >90)

## 2013-01-23 MED ORDER — LORAZEPAM 0.5 MG PO TABS
0.5000 mg | ORAL_TABLET | Freq: Once | ORAL | Status: AC
  Administered 2013-01-23: 0.5 mg via ORAL
  Filled 2013-01-23: qty 1

## 2013-01-23 NOTE — Progress Notes (Signed)
[facility] PROGRESS NOTE  Elizabeth [name] MVH:[id number] DOB: [birth year] DOA: 01/20/2013 PCP: Aida Puffer, MD  Assessment/Plan: 79 y/o female with PMH of hypothyroidism, HTN, CHF presented with SOB, DOE admitted with CHF   1. Acute on chronic CHF, systolic HF; echo (2010): LVEF 40%; CXR: Acute pulmonary edema -repeat echo (01/21/13): LVEF 20% to 25%. Diffuse hypokinesis -cont IV diuresis; daily weight, I/O neg 4.4L; cardiology eval for ? LHC  2. Hypo K; replace,    3. Hypothyroidism, TSH 1.0; cont levothyroxine;   4. UTI, cont atx; f/u c/s E coli; d/c IV atx in AM;   Code Status: full Family Communication: d/w patient, called [name] at [phone] no answer  (indicate person spoken with, relationship, and if by phone, the number) Disposition Plan: likely snf    Consultants:  None   Procedures:  CXR  Antibiotics:  cefrtiaxone 12/18<<<< (indicate start date, and stop date if known)  HPI/Subjective: alert  Objective: Filed Vitals:   01/23/13 0903  BP: 116/59  Pulse: 83  Temp: 98.1 F (36.7 C)  Resp:     Intake/Output Summary (Last 24 hours) at 01/23/13 1026 Last data filed at 01/23/13 1022  Gross per 24 hour  Intake   1113 ml  Output   3800 ml  Net  -2687 ml   Filed Weights   01/21/13 0109 01/22/13 0500 01/23/13 0447  Weight: 52.254 kg (115 lb 3.2 oz) 52.617 kg (116 lb) 51.846 kg (114 lb 4.8 oz)    Exam:   General:  alert  Cardiovascular: s1,s2 rrr  Respiratory: LL crackles   Abdomen: soft, nt, nd   Musculoskeletal: mild edema    Data Reviewed: Basic Metabolic Panel:  Recent Labs Lab 01/20/13 2021 01/21/13 0230 01/22/13 0440 01/23/13 0435  NA 139 141 141 137  K 4.2 3.2* 4.4 4.6  CL 103 102 102 100  CO2 26 26 29 23   GLUCOSE 131* 141* 104* 95  BUN 15 14 17 13   CREATININE 0.65 0.63 0.64 0.58  CALCIUM 9.0 8.9 8.6 8.5   Liver Function Tests:  Recent Labs Lab 01/21/13 0230  AST 19  ALT 21  ALKPHOS 103  BILITOT  1.0  PROT 6.0  ALBUMIN 3.3*   No results found for this basename: LIPASE, AMYLASE,  in the last 168 hours No results found for this basename: AMMONIA,  in the last 168 hours CBC:  Recent Labs Lab 01/20/13 2021 01/21/13 0230  WBC 7.7 7.2  NEUTROABS 6.0  --   HGB 12.4 12.7  HCT 37.9 37.6  MCV 95.9 95.2  PLT PLATELET CLUMPS NOTED ON SMEAR, COUNT APPEARS ADEQUATE 169   Cardiac Enzymes:  Recent Labs Lab 01/20/13 2021 01/21/13 0230 01/21/13 0902 01/21/13 1240  TROPONINI <0.30 <0.30 <0.30 <0.30   BNP (last 3 results)  Recent Labs  01/20/13 2021 01/21/13 0902  PROBNP 31867.0* 34451.0*   CBG: No results found for this basename: GLUCAP,  in the last 168 hours  Recent Results (from the past 240 hour(s))  URINE CULTURE     Status: None   Collection Time    01/20/13  9:06 PM      Result Value Range Status   Specimen Description URINE, CLEAN CATCH   Final   Special Requests NONE   Final   Culture  Setup Time     Final   Value: 01/20/2013 22:28     Performed at [facility] Count     Final   Value: >=100,000  COLONIES/ML     Performed at [facility]     Final   Value: ESCHERICHIA COLI     Performed at [facility]   Report Status 01/22/2013 FINAL   Final   Organism ID, Bacteria ESCHERICHIA COLI   Final     Studies: Dg Hip Complete Right  01/21/2013   CLINICAL DATA:  Pain in right hip.  EXAM: RIGHT HIP - COMPLETE 2+ VIEW  COMPARISON:  Left femur radiographs 09/09/2011.  FINDINGS: The bones are demineralized. There is no evidence of acute fracture or dislocation. Left proximal femoral dynamic screw and intra medullary nail are noted with probable posttraumatic deformity of the left pubic rami. Degenerated uterine fibroids and diffuse vascular calcifications are noted.  IMPRESSION: 1. No evidence of acute right femur fracture or dislocation. If the patient has persistent hip pain or inability to bear weight, follow up imaging may  be warranted as hip fractures can be radiographically occult in the elderly. 2. Osteopenia with old pubic rami fractures on the left.   Electronically Signed   By: Roxy Horseman M.D.   On: 01/21/2013 18:32    Scheduled Meds: . cefTRIAXone (ROCEPHIN)  IV  1 g Intravenous Q24H  . cloNIDine  0.1 mg Oral Daily  . furosemide  20 mg Intravenous BID  . heparin  5,000 Units Subcutaneous Q8H  . levothyroxine  100 mcg Oral QAC breakfast  . potassium chloride  20 mEq Oral BID  . sodium chloride  3 mL Intravenous Q12H   Continuous Infusions:   Principal Problem:   Acute CHF (congestive heart failure) Active Problems:   HYPOTHYROIDISM   HYPERTENSION   UTI    Time spent: >35 minutes     [name]  [facility] Pager [phone]. If 7PM-7AM, please contact night-coverage at [email or web address], password [facility] 01/23/2013, 10:26 AM  LOS: 3 days

## 2013-01-23 NOTE — Plan of Care (Signed)
Problem: Phase I Progression Outcomes Goal: EF % per last Echo/documented,Core Reminder form on chart Outcome: Completed/Met Date Met:  01/23/13 EF 20 -25% from 01/21/2013.

## 2013-01-24 DIAGNOSIS — R0609 Other forms of dyspnea: Secondary | ICD-10-CM

## 2013-01-24 LAB — BASIC METABOLIC PANEL
Calcium: 8.8 mg/dL (ref 8.4–10.5)
GFR calc non Af Amer: 79 mL/min — ABNORMAL LOW (ref >90)
Sodium: 135 mEq/L (ref 135–145)

## 2013-01-24 MED ORDER — LISINOPRIL 2.5 MG PO TABS
2.5000 mg | ORAL_TABLET | Freq: Every day | ORAL | Status: DC
  Administered 2013-01-24 – 2013-01-28 (×5): 2.5 mg via ORAL
  Filled 2013-01-24 (×5): qty 1

## 2013-01-24 MED ORDER — LEVOFLOXACIN 500 MG PO TABS
500.0000 mg | ORAL_TABLET | Freq: Once | ORAL | Status: AC
  Administered 2013-01-24: 500 mg via ORAL
  Filled 2013-01-24: qty 1

## 2013-01-24 MED ORDER — METOPROLOL TARTRATE 12.5 MG HALF TABLET
12.5000 mg | ORAL_TABLET | Freq: Two times a day (BID) | ORAL | Status: DC
  Administered 2013-01-24 – 2013-01-27 (×8): 12.5 mg via ORAL
  Filled 2013-01-24 (×10): qty 1

## 2013-01-24 NOTE — Progress Notes (Signed)
Report given to receiving RN. Patient is stable with no verbal complaints and no signs or symptoms of distress or discomfort.  

## 2013-01-24 NOTE — Progress Notes (Signed)
Physical Therapy Treatment Patient Details Name: Elizabeth [name] MRN: [id number] DOB: 1923-08-20 Today's Date: 01/24/2013 Time: 4098-1191 PT Time Calculation (min): 25 min  PT Assessment / Plan / Recommendation  History of Present Illness admission for SOB, acute CHF   PT Comments   Pt progressing with mobility able to ambulate in the hall today. Pt with sats 97% on RA on arrival and soon as she got up to [facility] stated SOB and need for O2 again unable to get pulse ox reading throughout mobility so put pt on 2L and she remained on it throughout mobility. Pt requires assist for transfers and supervision with gait and continue to recommend SNF.  Follow Up Recommendations  SNF;Supervision/Assistance - 24 hour     Does the patient have the potential to tolerate intense rehabilitation     Barriers to Discharge        Equipment Recommendations       Recommendations for Other Services    Frequency     Progress towards PT Goals Progress towards PT goals: Progressing toward goals  Plan Current plan remains appropriate    Precautions / Restrictions Precautions Precautions: Fall   Pertinent Vitals/Pain No pain   Mobility  Bed Mobility Bed Mobility: Not assessed Transfers Sit to Stand: 4: Min assist;From chair/3-in-1;With armrests Stand to Sit: 4: Min assist;To chair/3-in-1;With armrests Details for Transfer Assistance: cueing for hand placement and safety with assist to control descent x 2 trials Ambulation/Gait Ambulation/Gait Assistance: 5: Supervision Ambulation Distance (Feet): 150 Feet Assistive device: Rolling walker Ambulation/Gait Assistance Details: cues for posture and position in RW Gait Pattern: Step-through pattern;Decreased stride length;Trunk flexed Gait velocity: 20'/50sec=.40 ft/sec indicative of high fall risk Stairs: No    Exercises General Exercises - Lower Extremity Long Arc Quad: AROM;Seated;Both;15 reps Hip Flexion/Marching: AROM;Seated;Both;15 reps   PT  Diagnosis:    PT Problem List:   PT Treatment Interventions:     PT Goals (current goals can now be found in the care plan section)    Visit Information  Last PT Received On: 01/24/13 Assistance Needed: +1 History of Present Illness: admission for SOB, acute CHF    Subjective Data      Cognition  Cognition Arousal/Alertness: Awake/alert Behavior During Therapy: WFL for tasks assessed/performed Area of Impairment: Memory Safety/Judgement: Decreased awareness of safety;Decreased awareness of deficits General Comments: Pt unaware of room number and does not recall education from prior session    Balance     End of Session PT - End of Session Equipment Utilized During Treatment: Gait belt;Oxygen Activity Tolerance: Patient tolerated treatment well Patient left: in chair;with call bell/phone within reach Nurse Communication: Mobility status   GP     Toney Sang [facility] 01/24/2013, 2:38 PM Delaney Meigs, PT [phone]

## 2013-01-24 NOTE — Progress Notes (Addendum)
[facility] PROGRESS NOTE  Elizabeth [name] ZOX:[id number] DOB: Oct 28, 1923 DOA: 01/20/2013 PCP: Aida Puffer, MD  Assessment/Plan: 79 y/o female with PMH of hypothyroidism, HTN, CHF presented with SOB, DOE admitted with CHF   1. Acute on chronic CHF, systolic HF; echo (2010): LVEF 40%; CXR: Acute pulmonary edema -repeat echo (01/21/13): LVEF 20% to 25%. Diffuse hypokinesis; d/w Dr. Delton See who recommended to f/u outpatient in 1-2 week post diuresis; no need for CAD evaluation at this time  -improving; cont IV diuresis; daily weight, I/O neg 5.8L; add low dose DD, ACE if tolerates   2. Hypo K; replaced,  Now mild hyper K; hold KCL; recheck in AM  3. Hypothyroidism, TSH 1.0; cont levothyroxine;   4. UTI, cont atx; f/u c/s E coli; d/c IV atx in AM;   Possible d/c SNF in AM if patient agrees  Code Status: full Family Communication: d/w patient, called updated [name] at [phone]  (indicate person spoken with, relationship, and if by phone, the number) Disposition Plan: likely snf in AM if bed available    Consultants:  None   Procedures:  CXR  Antibiotics:  cefrtiaxone 12/18<<<< (indicate start date, and stop date if known)  HPI/Subjective: alert  Objective: Filed Vitals:   01/24/13 0954  BP: 112/49  Pulse: 84  Temp:   Resp:     Intake/Output Summary (Last 24 hours) at 01/24/13 1038 Last data filed at 01/24/13 1024  Gross per 24 hour  Intake   1170 ml  Output   2575 ml  Net  -1405 ml   Filed Weights   01/22/13 0500 01/23/13 0447 01/24/13 0547  Weight: 52.617 kg (116 lb) 51.846 kg (114 lb 4.8 oz) 51.302 kg (113 lb 1.6 oz)    Exam:   General:  alert  Cardiovascular: s1,s2 rrr  Respiratory: LL crackles   Abdomen: soft, nt, nd   Musculoskeletal: mild edema    Data Reviewed: Basic Metabolic Panel:  Recent Labs Lab 01/20/13 2021 01/21/13 0230 01/22/13 0440 01/23/13 0435 01/24/13 0628  NA 139 141 141 137 135  K 4.2 3.2* 4.4 4.6  5.3*  CL 103 102 102 100 98  CO2 26 26 29 23 25   GLUCOSE 131* 141* 104* 95 97  BUN 15 14 17 13 13   CREATININE 0.65 0.63 0.64 0.58 0.61  CALCIUM 9.0 8.9 8.6 8.5 8.8   Liver Function Tests:  Recent Labs Lab 01/21/13 0230  AST 19  ALT 21  ALKPHOS 103  BILITOT 1.0  PROT 6.0  ALBUMIN 3.3*   No results found for this basename: LIPASE, AMYLASE,  in the last 168 hours No results found for this basename: AMMONIA,  in the last 168 hours CBC:  Recent Labs Lab 01/20/13 2021 01/21/13 0230  WBC 7.7 7.2  NEUTROABS 6.0  --   HGB 12.4 12.7  HCT 37.9 37.6  MCV 95.9 95.2  PLT PLATELET CLUMPS NOTED ON SMEAR, COUNT APPEARS ADEQUATE 169   Cardiac Enzymes:  Recent Labs Lab 01/20/13 2021 01/21/13 0230 01/21/13 0902 01/21/13 1240  TROPONINI <0.30 <0.30 <0.30 <0.30   BNP (last 3 results)  Recent Labs  01/20/13 2021 01/21/13 0902  PROBNP 31867.0* 34451.0*   CBG: No results found for this basename: GLUCAP,  in the last 168 hours  Recent Results (from the past 240 hour(s))  URINE CULTURE     Status: None   Collection Time    01/20/13  9:06 PM      Result Value Range Status  Specimen Description URINE, CLEAN CATCH   Final   Special Requests NONE   Final   Culture  Setup Time     Final   Value: 01/20/2013 22:28     Performed at [facility] Count     Final   Value: >=100,000 COLONIES/ML     Performed at [facility]   Culture     Final   Value: ESCHERICHIA COLI     Performed at [facility]   Report Status 01/22/2013 FINAL   Final   Organism ID, Bacteria ESCHERICHIA COLI   Final     Studies: No results found.  Scheduled Meds: . cefTRIAXone (ROCEPHIN)  IV  1 g Intravenous Q24H  . cloNIDine  0.1 mg Oral Daily  . furosemide  20 mg Intravenous BID  . heparin  5,000 Units Subcutaneous Q8H  . levothyroxine  100 mcg Oral QAC breakfast  . sodium chloride  3 mL Intravenous Q12H   Continuous Infusions:   Principal Problem:   Acute CHF  (congestive heart failure) Active Problems:   HYPOTHYROIDISM   HYPERTENSION   UTI    Time spent: >35 minutes     [name]  [facility] Pager [phone]. If 7PM-7AM, please contact night-coverage at [email or web address], password [facility] 01/24/2013, 10:38 AM  LOS: 4 days

## 2013-01-25 LAB — BASIC METABOLIC PANEL
Chloride: 100 mEq/L (ref 96–112)
GFR calc Af Amer: 85 mL/min — ABNORMAL LOW (ref >90)
Potassium: 4.5 mEq/L (ref 3.5–5.1)
Sodium: 137 mEq/L (ref 135–145)

## 2013-01-25 MED ORDER — FUROSEMIDE 20 MG PO TABS
20.0000 mg | ORAL_TABLET | Freq: Every day | ORAL | Status: DC
  Administered 2013-01-26 – 2013-01-28 (×3): 20 mg via ORAL
  Filled 2013-01-25 (×4): qty 1

## 2013-01-25 MED ORDER — METOPROLOL TARTRATE 12.5 MG HALF TABLET: 12.5000 mg | ORAL_TABLET | Freq: Two times a day (BID) | ORAL | Status: AC

## 2013-01-25 MED ORDER — POTASSIUM CHLORIDE CRYS ER 10 MEQ PO TBCR
10.0000 meq | EXTENDED_RELEASE_TABLET | Freq: Every day | ORAL | Status: DC
  Administered 2013-01-25 – 2013-01-28 (×4): 10 meq via ORAL
  Filled 2013-01-25 (×4): qty 1

## 2013-01-25 MED ORDER — ALBUTEROL SULFATE (5 MG/ML) 0.5% IN NEBU: 2.5000 mg | INHALATION_SOLUTION | Freq: Four times a day (QID) | RESPIRATORY_TRACT | Status: DC | PRN

## 2013-01-25 MED ORDER — LISINOPRIL 2.5 MG PO TABS: 2.5000 mg | ORAL_TABLET | Freq: Every day | ORAL | Status: DC

## 2013-01-25 NOTE — Discharge Summary (Addendum)
Physician Discharge Summary  Elizabeth [name] UYQ:[id number] DOB: Jul 25, 1923 DOA: 01/20/2013  PCP: Aida Puffer, MD  Admit date: 01/20/2013 Discharge date: 01/28/2013  Time spent: >35 minutes  Recommendations for Outpatient Follow-up:  F/u with PCP in 1-2 weeks post rehab  Discharge Diagnoses:  Principal Problem:   Acute CHF (congestive heart failure) Active Problems:   HYPOTHYROIDISM   HYPERTENSION   UTI   Discharge Condition: stable   Diet recommendation: heart healthy   Filed Weights   01/23/13 0447 01/24/13 0547 01/25/13 0507  Weight: 51.846 kg (114 lb 4.8 oz) 51.302 kg (113 lb 1.6 oz) 51.347 kg (113 lb 3.2 oz)    History of present illness:  79 y/o female with PMH of hypothyroidism, HTN, CHF presented with SOB, DOE admitted with CHF   Hospital Course:  1. Acute on chronic CHF, systolic HF; echo (2010): LVEF 40%; CXR: Acute pulmonary edema  -repeat echo (01/21/13): LVEF 20% to 25%. Diffuse hypokinesis; d/w Dr. Delton See who recommended to f/u outpatient in 1-2 week post diuresis; no need for CAD evaluation at this time  -improved on IV diuresis; I/O neg 5.8L; SOB better; added low dose BB, ACE as tolerates; diuretics changed to PO 12/23   2. Hypothyroidism, TSH 1.0; cont levothyroxine;  3. UTI, E-coli; afebrile; completed ATX;   -C/s SW for SNF; d/w with family yesterday called updated [name] at [phone]  -d/s SNF when bed is available  Procedures:  none (i.e. Studies not automatically included, echos, thoracentesis, etc; not x-rays)  Consultations:  None   Discharge Exam: Filed Vitals:   01/25/13 0931  BP: 127/46  Pulse: 70  Temp: 98.1 F (36.7 C)  Resp:     General: alert Cardiovascular: S1,s2, rrr Respiratory: CTA BL  Discharge Instructions  Discharge Orders   Future Orders Complete By Expires   Diet - low sodium heart healthy  As directed    Discharge instructions  As directed    Comments:     Follow up with PCP as needed in 1-2  weeks   Increase activity slowly  As directed        Medication List         albuterol (5 MG/ML) 0.5% nebulizer solution  Commonly known as:  PROVENTIL  Take 0.5 mLs (2.5 mg total) by nebulization every 6 (six) hours as needed for wheezing or shortness of breath.     buPROPion 75 MG tablet  Commonly known as:  WELLBUTRIN  Take 75 mg by mouth 2 (two) times daily.     cloNIDine 0.1 MG tablet  Commonly known as:  CATAPRES  Take 0.1 mg by mouth daily.     furosemide 20 MG tablet  Commonly known as:  LASIX  Take 20 mg by mouth daily.     levothyroxine 100 MCG tablet  Commonly known as:  SYNTHROID, LEVOTHROID  Take 100 mcg by mouth daily before breakfast.     lisinopril 2.5 MG tablet  Commonly known as:  PRINIVIL,ZESTRIL  Take 1 tablet (2.5 mg total) by mouth daily.     metoprolol tartrate 12.5 mg Tabs tablet  Commonly known as:  LOPRESSOR  Take 0.5 tablets (12.5 mg total) by mouth 2 (two) times daily.     potassium chloride 10 MEQ tablet  Commonly known as:  K-DUR,KLOR-CON  Take 10 mEq by mouth daily.       No Known Allergies     Follow-up Information   Follow up with Aida Puffer, MD. Schedule an appointment as soon as  possible for a visit in 1 week.   Specialty:  Family Medicine   Contact information:   [address] [place] [address] [phone]        The results of significant diagnostics from this hospitalization (including imaging, microbiology, ancillary and laboratory) are listed below for reference.    Significant Diagnostic Studies: Dg Hip Complete Right  01/21/2013   CLINICAL DATA:  Pain in right hip.  EXAM: RIGHT HIP - COMPLETE 2+ VIEW  COMPARISON:  Left femur radiographs 09/09/2011.  FINDINGS: The bones are demineralized. There is no evidence of acute fracture or dislocation. Left proximal femoral dynamic screw and intra medullary nail are noted with probable posttraumatic deformity of the left pubic rami. Degenerated uterine fibroids and  diffuse vascular calcifications are noted.  IMPRESSION: 1. No evidence of acute right femur fracture or dislocation. If the patient has persistent hip pain or inability to bear weight, follow up imaging may be warranted as hip fractures can be radiographically occult in the elderly. 2. Osteopenia with old pubic rami fractures on the left.   Electronically Signed   By: Roxy Horseman M.D.   On: 01/21/2013 18:32   Dg Chest Port 1 View  01/20/2013   CLINICAL DATA:  Shortness of breath.  EXAM: PORTABLE CHEST - 1 VIEW  COMPARISON:  09/09/2011  FINDINGS: Symmetric bilateral airspace disease is seen, consistent with pulmonary edema. Cardiomegaly is stable. Probable small right pleural effusion also noted.  IMPRESSION: Acute pulmonary edema, likely secondary to congestive heart failure. Probable small right pleural effusion.   Electronically Signed   By: Myles Rosenthal M.D.   On: 01/20/2013 20:58    Microbiology: Recent Results (from the past 240 hour(s))  URINE CULTURE     Status: None   Collection Time    01/20/13  9:06 PM      Result Value Range Status   Specimen Description URINE, CLEAN CATCH   Final   Special Requests NONE   Final   Culture  Setup Time     Final   Value: 01/20/2013 22:28     Performed at [facility] Count     Final   Value: >=100,000 COLONIES/ML     Performed at [facility]   Culture     Final   Value: ESCHERICHIA COLI     Performed at [facility]   Report Status 01/22/2013 FINAL   Final   Organism ID, Bacteria ESCHERICHIA COLI   Final     Labs: Basic Metabolic Panel:  Recent Labs Lab 01/21/13 0230 01/22/13 0440 01/23/13 0435 01/24/13 0628 01/25/13 0455  NA 141 141 137 135 137  K 3.2* 4.4 4.6 5.3* 4.5  CL 102 102 100 98 100  CO2 26 29 23 25 27   GLUCOSE 141* 104* 95 97 112*  BUN 14 17 13 13 17   CREATININE 0.63 0.64 0.58 0.61 0.78  CALCIUM 8.9 8.6 8.5 8.8 8.5   Liver Function Tests:  Recent Labs Lab 01/21/13 0230  AST 19   ALT 21  ALKPHOS 103  BILITOT 1.0  PROT 6.0  ALBUMIN 3.3*   No results found for this basename: LIPASE, AMYLASE,  in the last 168 hours No results found for this basename: AMMONIA,  in the last 168 hours CBC:  Recent Labs Lab 01/20/13 2021 01/21/13 0230  WBC 7.7 7.2  NEUTROABS 6.0  --   HGB 12.4 12.7  HCT 37.9 37.6  MCV 95.9 95.2  PLT  PLATELET CLUMPS NOTED ON SMEAR, COUNT APPEARS ADEQUATE 169   Cardiac Enzymes:  Recent Labs Lab 01/20/13 2021 01/21/13 0230 01/21/13 0902 01/21/13 1240  TROPONINI <0.30 <0.30 <0.30 <0.30   BNP: BNP (last 3 results)  Recent Labs  01/20/13 2021 01/21/13 0902  PROBNP 31867.0* 34451.0*   CBG: No results found for this basename: GLUCAP,  in the last 168 hours     Signed:  [name]  [facility] 01/25/2013, 10:44 AM

## 2013-01-25 NOTE — Progress Notes (Signed)
Per MD- patient is medically stable for d/c today. CSW is unable to obtain PASARR number as the [facility] website is down.  Also- CSW is attempting to obtain authorization from [facility] for SNF placement. CSW will contact patient's nephew Trinna Post in the a.m regarding current bed offers and will continue attempts to obtain [facility] authorization.  PASARR number in now available.  Lorri [name]. [place]  [phone]

## 2013-01-25 NOTE — Progress Notes (Signed)
Utilization Review Completed Kirby Cortese J. Malavika Lira, RN, BSN, NCM [phone]  

## 2013-01-26 NOTE — Progress Notes (Signed)
[facility] PROGRESS NOTE  Elizabeth [name] ZOX:[id number] DOB: 03-27-1923 DOA: 01/20/2013 PCP: Elizabeth Puffer, MD  Assessment/Plan: 79 y/o female with PMH of hypothyroidism, HTN, CHF presented with SOB, DOE admitted with CHF  1. Acute on chronic CHF, systolic HF; echo (2010): LVEF 40%; CXR: Acute pulmonary edema  -repeat echo (01/21/13): LVEF 20% to 25%. Diffuse hypokinesis; d/w Dr. Delton See who recommended to f/u outpatient in 1-2 week post diuresis; no need for CAD evaluation at this time  -improved on IV diuresis; I/O neg 5.8L; SOB better; added low dose BB, ACE as tolerates; diuretics changed to PO 12/23  2. Hypothyroidism, TSH 1.0; cont levothyroxine;  3. UTI, E-coli; afebrile; completed ATX;     d/c SNF when bed is available  Code Status: full Family Communication: d/w patient, called updated [name] at [phone]  (indicate person spoken with, relationship, and if by phone, the number) Disposition Plan: likely snf in AM if bed available    Consultants:  None   Procedures:  CXR  Antibiotics:  cefrtiaxone 12/18<<<< (indicate start date, and stop date if known)  HPI/Subjective: alert  Objective: Filed Vitals:   01/26/13 1106  BP: 118/67  Pulse:   Temp:   Resp:     Intake/Output Summary (Last 24 hours) at 01/26/13 1124 Last data filed at 01/26/13 1120  Gross per 24 hour  Intake   1225 ml  Output   1951 ml  Net   -726 ml   Filed Weights   01/24/13 0547 01/25/13 0507 01/26/13 0342  Weight: 51.302 kg (113 lb 1.6 oz) 51.347 kg (113 lb 3.2 oz) 50.803 kg (112 lb)    Exam:   General:  alert  Cardiovascular: s1,s2 rrr  Respiratory: LL crackles   Abdomen: soft, nt, nd   Musculoskeletal: mild edema    Data Reviewed: Basic Metabolic Panel:  Recent Labs Lab 01/21/13 0230 01/22/13 0440 01/23/13 0435 01/24/13 0628 01/25/13 0455  NA 141 141 137 135 137  K 3.2* 4.4 4.6 5.3* 4.5  CL 102 102 100 98 100  CO2 26 29 23 25 27   GLUCOSE 141*  104* 95 97 112*  BUN 14 17 13 13 17   CREATININE 0.63 0.64 0.58 0.61 0.78  CALCIUM 8.9 8.6 8.5 8.8 8.5   Liver Function Tests:  Recent Labs Lab 01/21/13 0230  AST 19  ALT 21  ALKPHOS 103  BILITOT 1.0  PROT 6.0  ALBUMIN 3.3*   No results found for this basename: LIPASE, AMYLASE,  in the last 168 hours No results found for this basename: AMMONIA,  in the last 168 hours CBC:  Recent Labs Lab 01/20/13 2021 01/21/13 0230  WBC 7.7 7.2  NEUTROABS 6.0  --   HGB 12.4 12.7  HCT 37.9 37.6  MCV 95.9 95.2  PLT PLATELET CLUMPS NOTED ON SMEAR, COUNT APPEARS ADEQUATE 169   Cardiac Enzymes:  Recent Labs Lab 01/20/13 2021 01/21/13 0230 01/21/13 0902 01/21/13 1240  TROPONINI <0.30 <0.30 <0.30 <0.30   BNP (last 3 results)  Recent Labs  01/20/13 2021 01/21/13 0902  PROBNP 31867.0* 34451.0*   CBG: No results found for this basename: GLUCAP,  in the last 168 hours  Recent Results (from the past 240 hour(s))  URINE CULTURE     Status: None   Collection Time    01/20/13  9:06 PM      Result Value Range Status   Specimen Description URINE, CLEAN CATCH   Final   Special Requests NONE   Final  Culture  Setup Time     Final   Value: 01/20/2013 22:28     Performed at [facility] Count     Final   Value: >=100,000 COLONIES/ML     Performed at [facility]   Culture     Final   Value: ESCHERICHIA COLI     Performed at [facility]   Report Status 01/22/2013 FINAL   Final   Organism ID, Bacteria ESCHERICHIA COLI   Final     Studies: No results found.  Scheduled Meds: . cloNIDine  0.1 mg Oral Daily  . furosemide  20 mg Oral Daily  . heparin  5,000 Units Subcutaneous Q8H  . levothyroxine  100 mcg Oral QAC breakfast  . lisinopril  2.5 mg Oral Daily  . metoprolol tartrate  12.5 mg Oral BID  . potassium chloride  10 mEq Oral Daily  . sodium chloride  3 mL Intravenous Q12H   Continuous Infusions:   Principal Problem:   Acute CHF  (congestive heart failure) Active Problems:   HYPOTHYROIDISM   HYPERTENSION   UTI    Time spent: >35 minutes     [name]  [facility] Pager [phone]. If 7PM-7AM, please contact night-coverage at [email or web address], password [facility] 01/26/2013, 11:24 AM  LOS: 6 days

## 2013-01-26 NOTE — Progress Notes (Signed)
Physical Therapy Treatment Patient Details Name: ANUREET BRUINGTON MRN: [id number] DOB: 1923-09-02 Today's Date: 01/26/2013 Time: 1035-1100 PT Time Calculation (min): 25 min  PT Assessment / Plan / Recommendation  History of Present Illness Patient admitted for SOB, acute CHF.   PT Comments   Patient progressing with strength and mobility.  Wants to go home, but still feel she is high fall risk when nephew at work and she is unsupervised.  Will continue to follow.  Encouraged pt to ask nursing to assist her to walk in hallway when able.  Follow Up Recommendations  SNF;Supervision/Assistance - 24 hour     Does the patient have the potential to tolerate intense rehabilitation   N/A  Barriers to Discharge  None      Equipment Recommendations  None recommended by PT    Recommendations for Other Services  None  Frequency Min 3X/week   Progress towards PT Goals Progress towards PT goals: Progressing toward goals  Plan Current plan remains appropriate    Precautions / Restrictions Precautions Precautions: Fall Precaution Comments: O2 dep Restrictions Weight Bearing Restrictions: No   Pertinent Vitals/Pain Has min c/o pain right calf with activity    Mobility  Bed Mobility Bed Mobility: Not assessed Details for Bed Mobility Assistance: up on [facility] with nursing Transfers Sit to Stand: 4: Min assist;From chair/3-in-1;With armrests Stand to Sit: 4: Min assist;To chair/3-in-1;With armrests Details for Transfer Assistance: cues for anterior weight shift as pt reports wants to fall back Ambulation/Gait Ambulation/Gait Assistance: 5: Supervision Ambulation Distance (Feet): 200 Feet Assistive device: Rolling walker Ambulation/Gait Assistance Details: assist for O2 Gait Pattern: Step-through pattern;Decreased stride length;Trunk flexed    Exercises General Exercises - Upper Extremity Shoulder Flexion: AROM;Both;10 reps;Seated Shoulder ABduction: AROM;Both;10 reps;Seated General  Exercises - Lower Extremity Long Arc Quad: AROM;Seated;Both;10 reps Hip Flexion/Marching: AROM;Seated;Both;10 reps Toe Raises: AROM;Both;15 reps;Seated Heel Raises: AROM;Both;15 reps;Seated    PT Goals (current goals can now be found in the care plan section)    Visit Information  Last PT Received On: 01/26/13 Assistance Needed: +1 History of Present Illness: Patient admitted for SOB, acute CHF.    Subjective Data      Cognition  Cognition Arousal/Alertness: Awake/alert Behavior During Therapy: WFL for tasks assessed/performed Overall Cognitive Status: Within Functional Limits for tasks assessed Area of Impairment: Memory Memory: Decreased short-term memory    Balance  Balance Balance Assessed: Yes Dynamic Standing Balance Dynamic Standing - Balance Support: Bilateral upper extremity supported Dynamic Standing - Level of Assistance: 4: Min assist Dynamic Standing - Comments: moving from chair to 3:1  End of Session PT - End of Session Equipment Utilized During Treatment: Gait belt;Oxygen Activity Tolerance: Patient tolerated treatment well Patient left: in chair;with call bell/phone within reach   GP     [facility] 01/26/2013, 11:14 AM Sheran Lawless, PT [phone] 01/26/2013

## 2013-01-26 NOTE — Progress Notes (Signed)
Patient alert and oriented x4 this shift.  Patient stated she had right calf pain this evening.  Vascular checks remain consistent with assessment from this morning.  MD notified.  Administered PRN Tylenol.  Will continue to monitor.

## 2013-01-27 NOTE — Progress Notes (Signed)
[facility] PROGRESS NOTE  Elizabeth [name] YNW:[id number] DOB: 1923/12/06 DOA: 01/20/2013 PCP: Aida Puffer, MD  Assessment/Plan: 79 y/o female with PMH of hypothyroidism, HTN, CHF presented with SOB, DOE admitted with CHF  1. Acute on chronic CHF, systolic HF; echo (2010): LVEF 40%; CXR: Acute pulmonary edema  -repeat echo (01/21/13): LVEF 20% to 25%. Diffuse hypokinesis; d/w Dr. Delton See who recommended to f/u outpatient in 1-2 week post diuresis; no need for CAD evaluation at this time  -improved on IV diuresis; I/O neg 5.8L; SOB better; added low dose BB, ACE as tolerates; diuretics changed to PO 12/23  2. Hypothyroidism, TSH 1.0; cont levothyroxine;  3. UTI, E-coli; afebrile; completed ATX;     d/c SNF when bed is available  Code Status: full Family Communication: d/w patient, called updated [name] at [phone]  (indicate person spoken with, relationship, and if by phone, the number) Disposition Plan: likely snf in AM if bed available    Consultants:  None   Procedures:  CXR  Antibiotics:  cefrtiaxone 12/18<<<< (indicate start date, and stop date if known)  HPI/Subjective: alert  Objective: Filed Vitals:   01/27/13 0530  BP: 127/46  Pulse: 57  Temp: 97.6 F (36.4 C)  Resp: 18    Intake/Output Summary (Last 24 hours) at 01/27/13 1048 Last data filed at 01/27/13 0851  Gross per 24 hour  Intake   1163 ml  Output   2150 ml  Net   -987 ml   Filed Weights   01/25/13 0507 01/26/13 0342 01/27/13 0530  Weight: 51.347 kg (113 lb 3.2 oz) 50.803 kg (112 lb) 51.619 kg (113 lb 12.8 oz)    Exam:   General:  alert  Cardiovascular: s1,s2 rrr  Respiratory: LL crackles   Abdomen: soft, nt, nd   Musculoskeletal: mild edema    Data Reviewed: Basic Metabolic Panel:  Recent Labs Lab 01/21/13 0230 01/22/13 0440 01/23/13 0435 01/24/13 0628 01/25/13 0455  NA 141 141 137 135 137  K 3.2* 4.4 4.6 5.3* 4.5  CL 102 102 100 98 100  CO2 26 29 23  25 27   GLUCOSE 141* 104* 95 97 112*  BUN 14 17 13 13 17   CREATININE 0.63 0.64 0.58 0.61 0.78  CALCIUM 8.9 8.6 8.5 8.8 8.5   Liver Function Tests:  Recent Labs Lab 01/21/13 0230  AST 19  ALT 21  ALKPHOS 103  BILITOT 1.0  PROT 6.0  ALBUMIN 3.3*   No results found for this basename: LIPASE, AMYLASE,  in the last 168 hours No results found for this basename: AMMONIA,  in the last 168 hours CBC:  Recent Labs Lab 01/20/13 2021 01/21/13 0230  WBC 7.7 7.2  NEUTROABS 6.0  --   HGB 12.4 12.7  HCT 37.9 37.6  MCV 95.9 95.2  PLT PLATELET CLUMPS NOTED ON SMEAR, COUNT APPEARS ADEQUATE 169   Cardiac Enzymes:  Recent Labs Lab 01/20/13 2021 01/21/13 0230 01/21/13 0902 01/21/13 1240  TROPONINI <0.30 <0.30 <0.30 <0.30   BNP (last 3 results)  Recent Labs  01/20/13 2021 01/21/13 0902  PROBNP 31867.0* 34451.0*   CBG: No results found for this basename: GLUCAP,  in the last 168 hours  Recent Results (from the past 240 hour(s))  URINE CULTURE     Status: None   Collection Time    01/20/13  9:06 PM      Result Value Range Status   Specimen Description URINE, CLEAN CATCH   Final   Special Requests NONE  Final   Culture  Setup Time     Final   Value: 01/20/2013 22:28     Performed at [facility] Count     Final   Value: >=100,000 COLONIES/ML     Performed at [facility]   Culture     Final   Value: ESCHERICHIA COLI     Performed at [facility]   Report Status 01/22/2013 FINAL   Final   Organism ID, Bacteria ESCHERICHIA COLI   Final     Studies: No results found.  Scheduled Meds: . cloNIDine  0.1 mg Oral Daily  . furosemide  20 mg Oral Daily  . heparin  5,000 Units Subcutaneous Q8H  . levothyroxine  100 mcg Oral QAC breakfast  . lisinopril  2.5 mg Oral Daily  . metoprolol tartrate  12.5 mg Oral BID  . potassium chloride  10 mEq Oral Daily  . sodium chloride  3 mL Intravenous Q12H   Continuous Infusions:   Principal  Problem:   Acute CHF (congestive heart failure) Active Problems:   HYPOTHYROIDISM   HYPERTENSION   UTI    Time spent: >35 minutes     [name]  [facility] Pager [phone]. If 7PM-7AM, please contact night-coverage at [email or web address], password [facility] 01/27/2013, 10:48 AM  LOS: 7 days

## 2013-01-28 DIAGNOSIS — R269 Unspecified abnormalities of gait and mobility: Secondary | ICD-10-CM

## 2013-01-28 NOTE — Progress Notes (Signed)
[facility] PROGRESS NOTE  Elizabeth [name] WUJ:[id number] DOB: 23-Mar-1923 DOA: 01/20/2013 PCP: Aida Puffer, MD  Assessment/Plan: 79 y/o female with PMH of hypothyroidism, HTN, CHF presented with SOB, DOE admitted with CHF  1. Acute on chronic CHF, systolic HF; echo (2010): LVEF 40%; CXR: Acute pulmonary edema  -repeat echo (01/21/13): LVEF 20% to 25%. Diffuse hypokinesis; d/w Dr. Delton See who recommended to f/u outpatient in 1-2 week post diuresis; no need for CAD evaluation at this time  -improved on IV diuresis; I/O neg 5.8L; SOB better; added low dose BB, ACE as tolerates; diuretics changed to PO 12/23  2. Hypothyroidism, TSH 1.0; cont levothyroxine;  3. UTI, E-coli; afebrile; completed ATX;     d/c SNF today  Code Status: full Family Communication: d/w patient, called updated [name] at [phone]  (indicate person spoken with, relationship, and if by phone, the number) Disposition Plan: likely snf in AM if bed available    Consultants:  None   Procedures:  CXR  Antibiotics:  cefrtiaxone 12/18<<<< (indicate start date, and stop date if known)  HPI/Subjective: alert  Objective: Filed Vitals:   01/28/13 0940  BP: 110/46  Pulse: 61  Temp:   Resp: 20    Intake/Output Summary (Last 24 hours) at 01/28/13 1021 Last data filed at 01/28/13 0856  Gross per 24 hour  Intake   1500 ml  Output   1776 ml  Net   -276 ml   Filed Weights   01/26/13 0342 01/27/13 0530 01/28/13 0530  Weight: 50.803 kg (112 lb) 51.619 kg (113 lb 12.8 oz) 52.164 kg (115 lb)    Exam:   General:  alert  Cardiovascular: s1,s2 rrr  Respiratory: LL crackles   Abdomen: soft, nt, nd   Musculoskeletal: mild edema    Data Reviewed: Basic Metabolic Panel:  Recent Labs Lab 01/22/13 0440 01/23/13 0435 01/24/13 0628 01/25/13 0455  NA 141 137 135 137  K 4.4 4.6 5.3* 4.5  CL 102 100 98 100  CO2 29 23 25 27   GLUCOSE 104* 95 97 112*  BUN 17 13 13 17   CREATININE 0.64  0.58 0.61 0.78  CALCIUM 8.6 8.5 8.8 8.5   Liver Function Tests: No results found for this basename: AST, ALT, ALKPHOS, BILITOT, PROT, ALBUMIN,  in the last 168 hours No results found for this basename: LIPASE, AMYLASE,  in the last 168 hours No results found for this basename: AMMONIA,  in the last 168 hours CBC: No results found for this basename: WBC, NEUTROABS, HGB, HCT, MCV, PLT,  in the last 168 hours Cardiac Enzymes:  Recent Labs Lab 01/21/13 1240  TROPONINI <0.30   BNP (last 3 results)  Recent Labs  01/20/13 2021 01/21/13 0902  PROBNP 31867.0* 34451.0*   CBG: No results found for this basename: GLUCAP,  in the last 168 hours  Recent Results (from the past 240 hour(s))  URINE CULTURE     Status: None   Collection Time    01/20/13  9:06 PM      Result Value Range Status   Specimen Description URINE, CLEAN CATCH   Final   Special Requests NONE   Final   Culture  Setup Time     Final   Value: 01/20/2013 22:28     Performed at [facility] Count     Final   Value: >=100,000 COLONIES/ML     Performed at [facility]   Culture     Final   Value:  ESCHERICHIA COLI     Performed at [facility]   Report Status 01/22/2013 FINAL   Final   Organism ID, Bacteria ESCHERICHIA COLI   Final     Studies: No results found.  Scheduled Meds: . cloNIDine  0.1 mg Oral Daily  . furosemide  20 mg Oral Daily  . heparin  5,000 Units Subcutaneous Q8H  . levothyroxine  100 mcg Oral QAC breakfast  . lisinopril  2.5 mg Oral Daily  . metoprolol tartrate  12.5 mg Oral BID  . potassium chloride  10 mEq Oral Daily  . sodium chloride  3 mL Intravenous Q12H   Continuous Infusions:   Principal Problem:   Acute CHF (congestive heart failure) Active Problems:   HYPOTHYROIDISM   HYPERTENSION   UTI    Time spent: >35 minutes     [name]  [facility] Pager [phone]. If 7PM-7AM, please contact night-coverage at [email or web address],  password [facility] 01/28/2013, 10:21 AM  LOS: 8 days

## 2013-01-29 ENCOUNTER — Other Ambulatory Visit: Payer: Self-pay

## 2013-01-29 LAB — URINALYSIS, COMPLETE
Bacteria: NONE SEEN
Bilirubin,UR: NEGATIVE
Leukocyte Esterase: NEGATIVE
Protein: NEGATIVE
RBC,UR: <1 /HPF (ref 0–5)
Specific Gravity: 1.005 (ref 1.003–1.030)
WBC UR: <1 /HPF (ref 0–5)

## 2013-01-30 LAB — URINE CULTURE

## 2013-01-30 NOTE — Clinical Social Work Placement (Addendum)
    Clinical Social Work Department CLINICAL SOCIAL WORK PLACEMENT NOTE 01/30/2013  Patient:  Elizabeth [name], Elizabeth [name]  Account Number:  [id number] Admit date:  01/20/2013  Clinical Social Worker:  Sharol Harness, Theresia Majors  Date/time:  01/22/2013 03:00 PM  Clinical Social Work is seeking post-discharge placement for this patient at the following level of care:   SKILLED NURSING   (*CSW will update this form in Epic as items are completed)   01/22/2013  Patient/family provided with [facility] Department of Clinical Social Work's list of facilities offering this level of care within the geographic area requested by the patient (or if unable, by the patient's family).  01/22/2013  Patient/family informed of their freedom to choose among providers that offer the needed level of care, that participate in Medicare, Medicaid or managed care program needed by the patient, have an available bed and are willing to accept the patient.  01/22/2013  Patient/family informed of [facility]' ownership interest in [facility], as well as of the fact that they are under no obligation to receive care at this facility.  PASARR submitted to EDS on 01/22/2013 PASARR number received from EDS on 01/22/2013  FL2 transmitted to all facilities in geographic area requested by pt/family on  01/22/2013 FL2 transmitted to all facilities within larger geographic area on   Patient informed that his/her managed care company has contracts with or will negotiate with  certain facilities, including the following:   [facility]     Patient/family informed of bed offers received:  01/28/2013 Patient chooses bed at [facility] Physician recommends and patient chooses bed at    Patient to be transferred to [facility] on  01/28/2013 Patient to be transferred to facility by Ambulance Sharin Mons)  The following physician request were entered in Epic:   Additional Comments: 01/28/13  DC per MD to SNF- ok per patient  and family. Nursing notified and called report.  CSW signing off. Family and patient were pleased with d/c plan. Blue Auth number - obtained late afternoon 01/25/13   Lorri [name]. Rillie Riffel, LCSWA  [phone]

## 2013-02-01 ENCOUNTER — Encounter: Payer: Self-pay | Admitting: Adult Health

## 2013-02-01 ENCOUNTER — Non-Acute Institutional Stay (SKILLED_NURSING_FACILITY): Payer: Medicare Other | Admitting: Adult Health

## 2013-02-01 DIAGNOSIS — E039 Hypothyroidism, unspecified: Secondary | ICD-10-CM

## 2013-02-01 DIAGNOSIS — I509 Heart failure, unspecified: Secondary | ICD-10-CM

## 2013-02-01 DIAGNOSIS — E876 Hypokalemia: Secondary | ICD-10-CM

## 2013-02-01 DIAGNOSIS — I10 Essential (primary) hypertension: Secondary | ICD-10-CM

## 2013-02-01 DIAGNOSIS — F341 Dysthymic disorder: Secondary | ICD-10-CM

## 2013-02-01 NOTE — Progress Notes (Signed)
Patient ID: Elizabeth [name], female   DOB: 04/04/1923, 79 y.o.   MRN: [id number]     Elizabeth [name]  No Known Allergies   Chief Complaint  Patient presents with  . Hospitalization Follow-up    HPI: She has recently [name] hospitalized for acute congestive heart failure. She was also found to have an uti which was treated in the hospital. She has [name] placed on supplemental k+. She is here for short term rehab with a goal to return back home. It is important to note that her EF is 20-25%.   Past Medical History  Diagnosis Date  . Thyroid disease   . Hypertension   . Abnormality of gait 04/21/2008    Qualifier: Diagnosis of  By: Sheffield Slider MD, Deniece Portela    . ALCOHOL ABUSE 04/21/2008    Qualifier: Diagnosis of  By: Sheffield Slider MD, Deniece Portela    . ANEMIA, SECONDARY TO ACUTE BLOOD LOSS 04/21/2008    Qualifier: Diagnosis of  By: Sheffield Slider MD, Deniece Portela    . DEPRESSION/ANXIETY 04/21/2008    Qualifier: Diagnosis of  By: Sheffield Slider MD, Deniece Portela    . Edema 05/23/2008    Qualifier: Diagnosis of  By: Eden Emms, MD, Harrington Challenger   . LOSS OF APPETITE 04/21/2008    Qualifier: Diagnosis of  By: Sheffield Slider MD, Deniece Portela    . UTI 04/21/2008    Qualifier: Diagnosis of  By: Sheffield Slider MD, Deniece Portela    . CLOSED FRACTURE INTERTROCHANTERIC SECTION FEMUR 04/21/2008    Qualifier: Diagnosis of  By: Sheffield Slider MD, Deniece Portela    . OSTEOARTHRITIS, KNEE, LEFT, MILD 04/21/2008    Qualifier: Diagnosis of  By: Sheffield Slider MD, Deniece Portela    . OSTEOPOROSIS 04/21/2008    Qualifier: Diagnosis of  By: Sheffield Slider MD, Deniece Portela    . HYPOTHYROIDISM 04/21/2008    Qualifier: Diagnosis of  By: Sheffield Slider MD, Deniece Portela    . GALLBLADDER DISEASE 05/22/2008    Qualifier: Diagnosis of  By: Kem Parkinson    . Acute CHF (congestive heart failure) 01/21/2013  . Essential hypertension, benign 04/21/2008    Qualifier: Diagnosis of  By: Sheffield Slider MD, Deniece Portela    . LBBB 05/23/2008    Qualifier: Diagnosis of  By: Eden Emms, MD, Harrington Challenger     Past Surgical History  Procedure Laterality Date  . Cholecystectomy    . Femur im nail   09/09/2011    Procedure: INTRAMEDULLARY (IM) NAIL FEMORAL;  Surgeon: Kathryne Hitch, MD;  Location: [facility] OR;  Service: Orthopedics;  Laterality: Left;  distal interlocking screws   History   Social History  . Marital Status: Single    Spouse Name: N/A    Number of Children: N/A  . Years of Education: N/A   Occupational History  . Not on file.   Social History Main Topics  . Smoking status: Never Smoker   . Smokeless tobacco: Current User    Types: Snuff  . Alcohol Use: Yes     Comment: rare  . Drug Use: No  . Sexual Activity: No   Other Topics Concern  . Not on file   Social History Narrative  . No narrative on file    VITAL SIGNS BP 124/70  Pulse 68  Ht 5\' 6"  (1.676 m)  Wt 115 lb (52.164 kg)  BMI 18.57 kg/m2   Patient's Medications  New Prescriptions   No medications on file  Previous Medications   ALBUTEROL (PROVENTIL) (5 MG/ML) 0.5% NEBULIZER SOLUTION    Take 0.5 mLs (2.5 mg total) by nebulization  every 6 (six) hours as needed for wheezing or shortness of breath.   BUPROPION (WELLBUTRIN) 75 MG TABLET    Take 75 mg by mouth 2 (two) times daily.   CLONIDINE (CATAPRES) 0.1 MG TABLET    Take 0.1 mg by mouth daily.   FUROSEMIDE (LASIX) 20 MG TABLET    Take 20 mg by mouth daily.   LEVOTHYROXINE (SYNTHROID, LEVOTHROID) 100 MCG TABLET    Take 100 mcg by mouth daily before breakfast.   LISINOPRIL (PRINIVIL,ZESTRIL) 2.5 MG TABLET    Take 1 tablet (2.5 mg total) by mouth daily.   METOPROLOL TARTRATE (LOPRESSOR) 12.5 MG TABS TABLET    Take 0.5 tablets (12.5 mg total) by mouth 2 (two) times daily.   POTASSIUM CHLORIDE (K-DUR,KLOR-CON) 10 MEQ TABLET    Take 10 mEq by mouth daily.  Modified Medications   No medications on file  Discontinued Medications   No medications on file    SIGNIFICANT DIAGNOSTIC EXAMS  01-20-13: chest x-ray: Acute pulmonary edema, likely secondary to congestive heart failure. Probable small right pleural effusion.  01-20-13: right hip x-ray:  1. No evidence of acute right femur fracture or dislocation. If the patient has persistent hip pain or inability to bear weight, follow up imaging may be warranted as hip fractures can be radio graphically occult in the elderly.2. Osteopenia with old pubic rami fractures on the left.  01-21-13: 2-d echo: Left ventricle: The cavity size was severely dilated. Wall thickness was normal. Systolic function was severely reduced. The estimated ejection fraction was in the range of 20% to 25%. Diffuse hypokinesis. - Mitral valve: Calcified annulus. Mild to moderate regurgitation - Left atrium: The atrium was moderately dilated. - Right ventricle: The cavity size was mildly dilated. - Right atrium: The atrium was mildly dilated. - Atrial septum: No defect or patent foramen ovale was identified.   LABS REVIEWED:   01-20-13: wbc 7.7; hgb 12.4; hct 37.9; mcv 95.9; plt clumped; glucose 131; bun 15; creat 0.65; k+4.2; na++ 139; BNP 31, 867; urine culture: e-coli 01-21-13: wbc 7.2; hgb 12.7; hct 37.6; mcv 95.2.; plt 169; glucose 141; bun 14; creat 0.63; k+3.2; na++ 141; liver normal albumin 3.3; BNP 34, 451 01-25-13: glucose 11; bun 17; creat 0.78; k+4.5; na++ 137   Review of Systems  Constitutional: Negative for weight loss and malaise/fatigue.  Eyes: Negative for blurred vision.  Respiratory: Negative for cough, shortness of breath and wheezing.   Cardiovascular: Negative for chest pain, palpitations and leg swelling.  Gastrointestinal: Negative for heartburn, abdominal pain and constipation.  Musculoskeletal: Negative for joint pain and myalgias.  Skin: Negative.   Neurological: Negative for weakness and headaches.  Psychiatric/Behavioral: The patient is not nervous/anxious and does not have insomnia.    Physical Exam  Constitutional: No distress.  Frail   Neck: Neck supple. No JVD present.  Cardiovascular: Normal rate, regular rhythm and intact distal pulses.   Respiratory: Effort normal and breath  sounds normal. No respiratory distress. She has no wheezes.  GI: Soft. Bowel sounds are normal. She exhibits no distension. There is no tenderness.  Musculoskeletal: Normal range of motion. She exhibits edema.  Has trace pedal edema   Neurological: She is alert.  Skin: Skin is warm and dry. She is not diaphoretic.  Psychiatric: She has a normal mood and affect.     ASSESSMENT/ PLAN:  1. Chf: is stable will continue lasix 20 mg daily and will [name] her on daily weights will have nursing staff call if she gains  3 pounds or more in one day or 5 pounds or more in one week. Will continue to monitor   2. Hypokalemia: will continue k+ 10 meq daily   3. Hypertension: is stable will continue clonidine 0.1 mg daily; lisinopril 2.5 mg daily; lopressor 12.5 mg twice daily and will continue to monitor her status.   3. Hypothyroidism: will continue synthroid 100 mcg daily and will monitor  4. Depression with anxiety is stable will continue wellbutrin 75 mg twice daily and will monitor   Will check bmp and BNP in one week   Time spent with patient 50  Minutes.

## 2013-02-07 ENCOUNTER — Non-Acute Institutional Stay (SKILLED_NURSING_FACILITY): Payer: Medicare Other | Admitting: Internal Medicine

## 2013-02-07 ENCOUNTER — Encounter: Payer: Self-pay | Admitting: Internal Medicine

## 2013-02-07 DIAGNOSIS — F341 Dysthymic disorder: Secondary | ICD-10-CM

## 2013-02-07 DIAGNOSIS — I10 Essential (primary) hypertension: Secondary | ICD-10-CM

## 2013-02-07 DIAGNOSIS — I509 Heart failure, unspecified: Secondary | ICD-10-CM

## 2013-02-07 DIAGNOSIS — E039 Hypothyroidism, unspecified: Secondary | ICD-10-CM

## 2013-02-07 DIAGNOSIS — E876 Hypokalemia: Secondary | ICD-10-CM

## 2013-02-07 NOTE — Progress Notes (Signed)
Patient ID: Elizabeth [name], female   DOB: 1923/09/01, 80 y.o.   MRN: [id number]    Elizabeth [name]    PCP: Aida Puffer, MD  Code Status: full code  No Known Allergies  Chief Complaint: new admit  HPI:  81 y/o female patient is here for STR after hospital admission with chf exacerbation from 01/20/13- 01/28/13. She has hx of chf, HTN and hypothyroidism. Her cxr was s/o acute pulmonary edema and echo showed EF 20-25% with diffuse hypokinesis. Cardiology was consulted and recommended outpatient follow up. She responded well to diuretics. She was also treated for e.coli UTI. She was seen in her room today. Se is in no distress. She is alert and oriented and denies any complaints besides her loose bowel movement this am. Has been working with therapy team and has made progress  Review of Systems:  Constitutional: Negative for fever, chills, weight loss, malaise/fatigue and diaphoresis.  HENT: Negative for congestion, hearing loss and sore throat.   Eyes: Negative for blurred vision, double vision and discharge.  Respiratory: Negative for cough, sputum production, shortness of breath and wheezing.   Cardiovascular: Negative for chest pain, palpitations, orthopnea and leg swelling.  Gastrointestinal: Negative for heartburn, nausea, vomiting, abdominal pain. Usually is constipated but has had 4 loose stool since this am. No blood or mucus Genitourinary: Negative for dysuria, urgency, frequency and flank pain.  Musculoskeletal: Negative for back pain, falls, joint pain and myalgias.  Skin: Negative for itching and rash.  Neurological: Positive for weakness. Negative for dizziness, tingling, focal weakness and headaches.  Psychiatric/Behavioral: Negative for depression and memory loss. The patient is not nervous/anxious.     Past Medical History  Diagnosis Date  . Thyroid disease   . Hypertension   . Abnormality of gait 04/21/2008    Qualifier: Diagnosis of  By: Sheffield Slider MD, Deniece Portela      . ALCOHOL ABUSE 04/21/2008    Qualifier: Diagnosis of  By: Sheffield Slider MD, Deniece Portela    . ANEMIA, SECONDARY TO ACUTE BLOOD LOSS 04/21/2008    Qualifier: Diagnosis of  By: Sheffield Slider MD, Deniece Portela    . DEPRESSION/ANXIETY 04/21/2008    Qualifier: Diagnosis of  By: Sheffield Slider MD, Deniece Portela    . Edema 05/23/2008    Qualifier: Diagnosis of  By: Eden Emms, MD, Harrington Challenger   . LOSS OF APPETITE 04/21/2008    Qualifier: Diagnosis of  By: Sheffield Slider MD, Deniece Portela    . UTI 04/21/2008    Qualifier: Diagnosis of  By: Sheffield Slider MD, Deniece Portela    . CLOSED FRACTURE INTERTROCHANTERIC SECTION FEMUR 04/21/2008    Qualifier: Diagnosis of  By: Sheffield Slider MD, Deniece Portela    . OSTEOARTHRITIS, KNEE, LEFT, MILD 04/21/2008    Qualifier: Diagnosis of  By: Sheffield Slider MD, Deniece Portela    . OSTEOPOROSIS 04/21/2008    Qualifier: Diagnosis of  By: Sheffield Slider MD, Deniece Portela    . HYPOTHYROIDISM 04/21/2008    Qualifier: Diagnosis of  By: Sheffield Slider MD, Deniece Portela    . GALLBLADDER DISEASE 05/22/2008    Qualifier: Diagnosis of  By: Kem Parkinson    . Acute CHF (congestive heart failure) 01/21/2013  . Essential hypertension, benign 04/21/2008    Qualifier: Diagnosis of  By: Sheffield Slider MD, Deniece Portela    . LBBB 05/23/2008    Qualifier: Diagnosis of  By: Eden Emms, MD, Harrington Challenger    Past Surgical History  Procedure Laterality Date  . Cholecystectomy    . Femur im nail  09/09/2011    Procedure: INTRAMEDULLARY (IM) NAIL FEMORAL;  Surgeon: Kathryne Hitchhristopher Y Blackman, MD;  [facility];  Service: Orthopedics;  Laterality: Left;  distal interlocking screws   Social History:   reports that she has never smoked. Her smokeless tobacco use includes Snuff. She reports that she drinks alcohol. She reports that she does not use illicit drugs.  No family history on file.  Medications: Patient's Medications  New Prescriptions   No medications on file  Previous Medications   ALBUTEROL (PROVENTIL) (5 MG/ML) 0.5% NEBULIZER SOLUTION    Take 0.5 mLs (2.5 mg total) by nebulization every 6 (six) hours as needed for wheezing or shortness of  breath.   BUPROPION (WELLBUTRIN) 75 MG TABLET    Take 75 mg by mouth 2 (two) times daily.   CLONIDINE (CATAPRES) 0.1 MG TABLET    Take 0.1 mg by mouth daily.   FUROSEMIDE (LASIX) 20 MG TABLET    Take 20 mg by mouth daily.   LEVOTHYROXINE (SYNTHROID, LEVOTHROID) 100 MCG TABLET    Take 100 mcg by mouth daily before breakfast.   LISINOPRIL (PRINIVIL,ZESTRIL) 2.5 MG TABLET    Take 1 tablet (2.5 mg total) by mouth daily.   METOPROLOL TARTRATE (LOPRESSOR) 12.5 MG TABS TABLET    Take 0.5 tablets (12.5 mg total) by mouth 2 (two) times daily.   POTASSIUM CHLORIDE (K-DUR,KLOR-CON) 10 MEQ TABLET    Take 10 mEq by mouth daily.  Modified Medications   No medications on file  Discontinued Medications   No medications on file     Physical Exam: Filed Vitals:   02/07/13 1738  BP: 122/62  Pulse: 60  Temp: 96.8 F (36 C)  Resp: 18  SpO2: 96%   Constitutional: No distress. Frail   Neck: Neck supple. No JVD present.  Cardiovascular: Normal rate, regular rhythm and intact distal pulses.   Respiratory: Effort normal and breath sounds normal. No respiratory distress. She has no wheezes.  GI: Soft. Bowel sounds are normal. She exhibits no distension. There is no tenderness.  Musculoskeletal: Normal range of motion. She has trace pedal edema  Neurological: She is alert.  Skin: Skin is warm and dry. She is not diaphoretic.  Psychiatric: She has a normal mood and affect.    Labs reviewed: Basic Metabolic Panel:  Recent Labs  16/11/9610/21/14 0435 01/24/13 0628 01/25/13 0455  NA 137 135 137  K 4.6 5.3* 4.5  CL 100 98 100  CO2 23 25 27   GLUCOSE 95 97 112*  BUN 13 13 17   CREATININE 0.58 0.61 0.78  CALCIUM 8.5 8.8 8.5   Liver Function Tests:  Recent Labs  01/21/13 0230  AST 19  ALT 21  ALKPHOS 103  BILITOT 1.0  PROT 6.0  ALBUMIN 3.3*   CBC:  Recent Labs  01/20/13 2021 01/21/13 0230  WBC 7.7 7.2  NEUTROABS 6.0  --   HGB 12.4 12.7  HCT 37.9 37.6  MCV 95.9 95.2  PLT PLATELET CLUMPS  NOTED ON SMEAR, COUNT APPEARS ADEQUATE 169   Cardiac Enzymes:  Recent Labs  01/21/13 0230 01/21/13 0902 01/21/13 1240  TROPONINI <0.30 <0.30 <0.30    Radiological Exams: Dg Hip Complete Right  01/21/2013   CLINICAL DATA:  Pain in right hip.  EXAM: RIGHT HIP - COMPLETE 2+ VIEW  COMPARISON:  Left femur radiographs 09/09/2011.  FINDINGS: The bones are demineralized. There is no evidence of acute fracture or dislocation. Left proximal femoral dynamic screw and intra medullary nail are noted with probable posttraumatic deformity of the left pubic rami. Degenerated uterine fibroids  and diffuse vascular calcifications are noted.  IMPRESSION: 1. No evidence of acute right femur fracture or dislocation. If the patient has persistent hip pain or inability to bear weight, follow up imaging may be warranted as hip fractures can be radiographically occult in the elderly. 2. Osteopenia with old pubic rami fractures on the left.   Electronically Signed   By: Roxy Horseman M.D.   On: 01/21/2013 18:32   Dg Chest Port 1 View  01/20/2013   CLINICAL DATA:  Shortness of breath.  EXAM: PORTABLE CHEST - 1 VIEW  COMPARISON:  09/09/2011  FINDINGS: Symmetric bilateral airspace disease is seen, consistent with pulmonary edema. Cardiomegaly is stable. Probable small right pleural effusion also noted.  IMPRESSION: Acute pulmonary edema, likely secondary to congestive heart failure. Probable small right pleural effusion.   Electronically Signed   By: Myles Rosenthal M.D.   On: 01/20/2013 20:58    Assessment/Plan  Chf- stable. Continue lopressor. continue lasix 20 mg daily with lisinopril 2.5 mg daily. Continue kcl supplement. Monitor weight, remains stable at present. Check bmp. Will have her work with PT and OT for strengthening exercises  Hypokalemia- will continue kcl 10 meq daily   Hypothyroidism- continue synthroid 100 mcg daily and will monitor  Hypertension- continue clonidine 0.1 mg daily, lisinopril 2.5 mg  daily, lopressor 12.5 mg twice daily. Monitor bmp and bp readings  Depression with anxiety- continue wellbutrin 75 mg twice daily and will monitor   Loose stool- has been recieving laxative. Will hold this for now and monitor clinically for signs of dehydration  Family/ staff Communication: reviewed care plan with patient and nursing supervisor   Goals of care: to return home after STR   Labs/tests ordered: cbc, bmp

## 2013-02-14 ENCOUNTER — Non-Acute Institutional Stay (SKILLED_NURSING_FACILITY): Payer: Medicare Other | Admitting: Internal Medicine

## 2013-02-14 ENCOUNTER — Encounter: Payer: Self-pay | Admitting: Internal Medicine

## 2013-02-14 DIAGNOSIS — R112 Nausea with vomiting, unspecified: Secondary | ICD-10-CM

## 2013-02-14 DIAGNOSIS — I959 Hypotension, unspecified: Secondary | ICD-10-CM

## 2013-02-14 NOTE — Progress Notes (Signed)
Patient ID: Elizabeth CarpenterKathleen M [name], female   DOB: [birth year], 80 y.o.   MRN: [id number]    [facility]  Chief Complaint  Patient presents with  . Acute Visit    nausea, vomiting   HPI:   80 y/o female patient is here for STR after hospital admission with chf exacerbation. She has been having ocasisonal loose stool with nuasea and vomiting on and off for a week now. She had one loose stool this am. She has recently completed course of antibiotics. Denies any abdominal pain but feels gasey. Poor appetite. Working with therapy team. Denies dizziness or lightheadedness. Blood pressure has been running on the lower side. No other concerns. No blood or mucus in stool  Review of Systems:  Constitutional: Negative for fever, chills, weight loss, malaise/fatigue and diaphoresis.  HENT: Negative for congestion, hearing loss and sore throat.   Eyes: Negative for blurred vision, double vision and discharge.  Respiratory: Negative for cough, sputum production, shortness of breath and wheezing.   Cardiovascular: Negative for chest pain, palpitations, orthopnea and leg swelling.  Gastrointestinal: Negative for heartburn Genitourinary: Negative for dysuria, urgency, frequency and flank pain.  Musculoskeletal: Negative for back pain, falls, joint pain and myalgias.  Skin: Negative for itching and rash.  Neurological: negative for dizziness, tingling, focal weakness and headaches.  Psychiatric/Behavioral: Negative for depression and memory loss. The patient is not nervous/anxious.    Past Medical History  Diagnosis Date  . Thyroid disease   . Hypertension   . Abnormality of gait 04/21/2008    Qualifier: Diagnosis of  By: Sheffield SliderHale MD, Deniece PortelaWayne    . ALCOHOL ABUSE 04/21/2008    Qualifier: Diagnosis of  By: Sheffield SliderHale MD, Deniece PortelaWayne    . ANEMIA, SECONDARY TO ACUTE BLOOD LOSS 04/21/2008    Qualifier: Diagnosis of  By: Sheffield SliderHale MD, Deniece PortelaWayne    . DEPRESSION/ANXIETY 04/21/2008    Qualifier: Diagnosis of  By: Sheffield SliderHale MD, Deniece PortelaWayne    .  Edema 05/23/2008    Qualifier: Diagnosis of  By: Eden EmmsNishan, MD, Harrington ChallengerFACC, Peter Charles   . LOSS OF APPETITE 04/21/2008    Qualifier: Diagnosis of  By: Sheffield SliderHale MD, Deniece PortelaWayne    . UTI 04/21/2008    Qualifier: Diagnosis of  By: Sheffield SliderHale MD, Deniece PortelaWayne    . CLOSED FRACTURE INTERTROCHANTERIC SECTION FEMUR 04/21/2008    Qualifier: Diagnosis of  By: Sheffield SliderHale MD, Deniece PortelaWayne    . OSTEOARTHRITIS, KNEE, LEFT, MILD 04/21/2008    Qualifier: Diagnosis of  By: Sheffield SliderHale MD, Deniece PortelaWayne    . OSTEOPOROSIS 04/21/2008    Qualifier: Diagnosis of  By: Sheffield SliderHale MD, Deniece PortelaWayne    . HYPOTHYROIDISM 04/21/2008    Qualifier: Diagnosis of  By: Sheffield SliderHale MD, Deniece PortelaWayne    . GALLBLADDER DISEASE 05/22/2008    Qualifier: Diagnosis of  By: Kem ParkinsonBarnes, Kimalexis    . Acute CHF (congestive heart failure) 01/21/2013  . Essential hypertension, benign 04/21/2008    Qualifier: Diagnosis of  By: Sheffield SliderHale MD, Deniece PortelaWayne    . LBBB 05/23/2008    Qualifier: Diagnosis of  By: Eden EmmsNishan, MD, Harrington ChallengerFACC, Peter Charles     [facility]. [facility]  Physical exam BP 100/51  Pulse 63  Temp(Src) 98.9 F (37.2 C)  Resp 18  SpO2 96%  Constitutional: No distress. Frail    Neck: Neck supple. No JVD present.   Cardiovascular: Normal rate, regular rhythm and intact distal pulses.    Respiratory: Effort normal and breath sounds normal. No respiratory distress. She has no wheezes.   GI: Soft. Bowel  sounds are normal. She exhibits no distension. There is no tenderness.  Musculoskeletal: Normal range of motion. She has trace pedal edema  Neurological: She is alert.   Skin: Skin is warm and dry. She is not diaphoretic.  Psychiatric: She has a normal mood and affect.   Labs- 02/08/13  Na 137, k 4, bun 17, cr 0.6, ca 9, u/a negative  Assessment/plan  Nausea/vomiting No abdominal pain. occassional loose stool. Will continue phenergan for symptomatic treatment of nausea and vomiting. Recheck bmp with liver function and pancreatic enzyme to rule out acute abdominal condition. If these blood work are normal, concerns for  gastroparesis vs GERD. Will have her on omeprazole 10 mg po daily for now. Encouraged po intake as tolerated.   Hypotension Change clonidine to 0.1 mg daily prn for SBP > 160. Continue lisinopril and lasix in setting of hx of CHF. Continue lopressor 12.5 mg bid

## 2013-02-18 ENCOUNTER — Non-Acute Institutional Stay (SKILLED_NURSING_FACILITY): Payer: Medicare Other | Admitting: Adult Health

## 2013-02-18 DIAGNOSIS — R112 Nausea with vomiting, unspecified: Secondary | ICD-10-CM

## 2013-02-18 DIAGNOSIS — K219 Gastro-esophageal reflux disease without esophagitis: Secondary | ICD-10-CM

## 2013-02-18 DIAGNOSIS — R197 Diarrhea, unspecified: Secondary | ICD-10-CM

## 2013-02-19 ENCOUNTER — Encounter: Payer: Self-pay | Admitting: Adult Health

## 2013-02-19 DIAGNOSIS — K219 Gastro-esophageal reflux disease without esophagitis: Secondary | ICD-10-CM | POA: Insufficient documentation

## 2013-02-19 DIAGNOSIS — R197 Diarrhea, unspecified: Principal | ICD-10-CM

## 2013-02-19 DIAGNOSIS — R112 Nausea with vomiting, unspecified: Secondary | ICD-10-CM | POA: Insufficient documentation

## 2013-02-19 MED ORDER — OMEPRAZOLE 10 MG PO CPDR: 20.0000 mg | DELAYED_RELEASE_CAPSULE | Freq: Every day | ORAL | Status: DC

## 2013-02-19 NOTE — Progress Notes (Signed)
Patient ID: Elizabeth CarpenterKathleen M [name], female   DOB: 1923-05-01, 80 y.o.   MRN: [id number]     [facility]  No Known Allergies   Chief Complaint  Patient presents with  . Acute Visit    n/v/d    HPI:  She has been having n/v/d for a prolonged period of time. She has been started on prilosec and was prn phenergan. She states that she is having frequent diarrheal stools which are watery in nature and is having upwards 5 or more per day. She is having abdominal pain. She is having n/v as well; she states she is nauseated before she even starts eating.    Past Medical History  Diagnosis Date  . Thyroid disease   . Hypertension   . Abnormality of gait 04/21/2008    Qualifier: Diagnosis of  By: Sheffield SliderHale MD, Deniece PortelaWayne    . ALCOHOL ABUSE 04/21/2008    Qualifier: Diagnosis of  By: Sheffield SliderHale MD, Deniece PortelaWayne    . ANEMIA, SECONDARY TO ACUTE BLOOD LOSS 04/21/2008    Qualifier: Diagnosis of  By: Sheffield SliderHale MD, Deniece PortelaWayne    . DEPRESSION/ANXIETY 04/21/2008    Qualifier: Diagnosis of  By: Sheffield SliderHale MD, Deniece PortelaWayne    . Edema 05/23/2008    Qualifier: Diagnosis of  By: Eden EmmsNishan, MD, Harrington ChallengerFACC, Peter Charles   . LOSS OF APPETITE 04/21/2008    Qualifier: Diagnosis of  By: Sheffield SliderHale MD, Deniece PortelaWayne    . UTI 04/21/2008    Qualifier: Diagnosis of  By: Sheffield SliderHale MD, Deniece PortelaWayne    . CLOSED FRACTURE INTERTROCHANTERIC SECTION FEMUR 04/21/2008    Qualifier: Diagnosis of  By: Sheffield SliderHale MD, Deniece PortelaWayne    . OSTEOARTHRITIS, KNEE, LEFT, MILD 04/21/2008    Qualifier: Diagnosis of  By: Sheffield SliderHale MD, Deniece PortelaWayne    . OSTEOPOROSIS 04/21/2008    Qualifier: Diagnosis of  By: Sheffield SliderHale MD, Deniece PortelaWayne    . HYPOTHYROIDISM 04/21/2008    Qualifier: Diagnosis of  By: Sheffield SliderHale MD, Deniece PortelaWayne    . GALLBLADDER DISEASE 05/22/2008    Qualifier: Diagnosis of  By: Kem ParkinsonBarnes, Kimalexis    . Acute CHF (congestive heart failure) 01/21/2013  . Essential hypertension, benign 04/21/2008    Qualifier: Diagnosis of  By: Sheffield SliderHale MD, Deniece PortelaWayne    . LBBB 05/23/2008    Qualifier: Diagnosis of  By: Eden EmmsNishan, MD, Harrington ChallengerFACC, Peter Charles     Past Surgical History    Procedure Laterality Date  . Cholecystectomy    . Femur im nail  09/09/2011    Procedure: INTRAMEDULLARY (IM) NAIL FEMORAL;  Surgeon: Kathryne Hitchhristopher Y Blackman, MD;  Location: [facility] OR;  Service: Orthopedics;  Laterality: Left;  distal interlocking screws    VITAL SIGNS BP 119/57  Pulse 80  Ht 5\' 7"  (1.702 m)  Wt 113 lb 6.4 oz (51.438 kg)  BMI 17.76 kg/m2   Patient's Medications  New Prescriptions   No medications on file  Previous Medications   ALBUTEROL (PROVENTIL) (5 MG/ML) 0.5% NEBULIZER SOLUTION    Take 0.5 mLs (2.5 mg total) by nebulization every 6 (six) hours as needed for wheezing or shortness of breath.   BUPROPION (WELLBUTRIN) 75 MG TABLET    Take 75 mg by mouth 2 (two) times daily.   CLONIDINE (CATAPRES) 0.1 MG TABLET    Take 0.1 mg by mouth daily as needed.    FUROSEMIDE (LASIX) 20 MG TABLET    Take 20 mg by mouth daily.   LEVOTHYROXINE (SYNTHROID, LEVOTHROID) 100 MCG TABLET    Take 100 mcg by mouth daily before breakfast.  LISINOPRIL (PRINIVIL,ZESTRIL) 2.5 MG TABLET    Take 1 tablet (2.5 mg total) by mouth daily.   METOPROLOL TARTRATE (LOPRESSOR) 12.5 MG TABS TABLET    Take 0.5 tablets (12.5 mg total) by mouth 2 (two) times daily.   OMEPRAZOLE (PRILOSEC) 10 MG CAPSULE    Take 10 mg by mouth daily.   POTASSIUM CHLORIDE (K-DUR,KLOR-CON) 10 MEQ TABLET    Take 10 mEq by mouth daily.   PROMETHAZINE (PHENERGAN) 25 MG TABLET    Take 25 mg by mouth every 6 (six) hours as needed for nausea or vomiting.  Modified Medications   No medications on file  Discontinued Medications   No medications on file    SIGNIFICANT DIAGNOSTIC EXAMS  01-20-13: chest x-ray: Acute pulmonary edema, likely secondary to congestive heart failure. Probable small right pleural effusion.  01-20-13: right hip x-ray: 1. No evidence of acute right femur fracture or dislocation. If the patient has persistent hip pain or inability to bear weight, follow up imaging may be warranted as hip fractures can be radio  graphically occult in the elderly.2. Osteopenia with old pubic rami fractures on the left.  01-21-13: 2-d echo: Left ventricle: The cavity size was severely dilated. Wall thickness was normal. Systolic function was severely reduced. The estimated ejection fraction was in the range of 20% to 25%. Diffuse hypokinesis. - Mitral valve: Calcified annulus. Mild to moderate regurgitation - Left atrium: The atrium was moderately dilated. - Right ventricle: The cavity size was mildly dilated. - Right atrium: The atrium was mildly dilated. - Atrial septum: No defect or patent foramen ovale was identified.   LABS REVIEWED:   01-20-13: wbc 7.7; hgb 12.4; hct 37.9; mcv 95.9; plt clumped; glucose 131; bun 15; creat 0.65; k+4.2; na++ 139; BNP 31, 867; urine culture: e-coli 01-21-13: wbc 7.2; hgb 12.7; hct 37.6; mcv 95.2.; plt 169; glucose 141; bun 14; creat 0.63; k+3.2; na++ 141; liver normal albumin 3.3; BNP 34, 451 01-25-13: glucose 11; bun 17; creat 0.78; k+4.5; na++ 137  02-08-13: glucose 92; bun 17; crea t0.6 ;k+4.0; na++ 137; BNP 741      Review of Systems  Constitutional: Positive for malaise/fatigue.  HENT: Negative.   Respiratory: Negative for cough and shortness of breath.   Cardiovascular: Negative for chest pain, palpitations and leg swelling.  Gastrointestinal: Positive for nausea, vomiting, abdominal pain and diarrhea.       Is able to eat "little bit" feels nausea before she starts eating.   Genitourinary: Negative for dysuria.  Musculoskeletal: Negative for joint pain and myalgias.  Neurological: Positive for weakness. Negative for headaches.  Psychiatric/Behavioral: Negative for depression. The patient is not nervous/anxious.      Physical Exam  Constitutional: No distress.  frail  Neck: Neck supple. No JVD present.  Cardiovascular: Normal rate, regular rhythm and intact distal pulses.   Respiratory: Effort normal and breath sounds normal. No respiratory distress. She has no  wheezes.  GI: Soft. Bowel sounds are normal. She exhibits no distension. There is tenderness.  Has diffuse tenderness present; no stool in rectum present   Skin: She is not diaphoretic.      ASSESSMENT/ PLAN:  Nv/d; gerd: will increase her prilosec to 20 mg daily; will get a kub today; will check a stool for c-diff ad will begin florastor daily.  Will continue to monitor her status. She may need more bulk form medications and may need a better medication to help with her nausea.

## 2013-02-21 ENCOUNTER — Non-Acute Institutional Stay (SKILLED_NURSING_FACILITY): Payer: Medicare Other | Admitting: Adult Health

## 2013-02-21 ENCOUNTER — Encounter: Payer: Self-pay | Admitting: Adult Health

## 2013-02-21 DIAGNOSIS — K560 Paralytic ileus: Secondary | ICD-10-CM

## 2013-02-21 DIAGNOSIS — R197 Diarrhea, unspecified: Secondary | ICD-10-CM

## 2013-02-21 DIAGNOSIS — K567 Ileus, unspecified: Secondary | ICD-10-CM | POA: Insufficient documentation

## 2013-02-21 MED ORDER — PSYLLIUM 28 % PO PACK: 1.0000 | PACK | Freq: Two times a day (BID) | ORAL | Status: DC

## 2013-02-21 NOTE — Progress Notes (Signed)
Patient ID: Elizabeth CarpenterKathleen M [name], female   DOB: Feb 23, 1923, 80 y.o.   MRN: [id number]     [facility]  No Known Allergies   Chief Complaint  Patient presents with  . Acute Visit    follow up x-ray     HPI:  Her x-ray done over the weekend demonstrated a mild ileus. She is able to eat with less nausea; however; she is having diarrheal stools. The specimen was collected for c-diff. She will need bulking to begin for her slowly. She states that she is slowly feeling a little better.    Past Medical History  Diagnosis Date  . Thyroid disease   . Hypertension   . Abnormality of gait 04/21/2008    Qualifier: Diagnosis of  By: Sheffield SliderHale MD, Deniece PortelaWayne    . ALCOHOL ABUSE 04/21/2008    Qualifier: Diagnosis of  By: Sheffield SliderHale MD, Deniece PortelaWayne    . ANEMIA, SECONDARY TO ACUTE BLOOD LOSS 04/21/2008    Qualifier: Diagnosis of  By: Sheffield SliderHale MD, Deniece PortelaWayne    . DEPRESSION/ANXIETY 04/21/2008    Qualifier: Diagnosis of  By: Sheffield SliderHale MD, Deniece PortelaWayne    . Edema 05/23/2008    Qualifier: Diagnosis of  By: Eden EmmsNishan, MD, Harrington ChallengerFACC, Peter Charles   . LOSS OF APPETITE 04/21/2008    Qualifier: Diagnosis of  By: Sheffield SliderHale MD, Deniece PortelaWayne    . UTI 04/21/2008    Qualifier: Diagnosis of  By: Sheffield SliderHale MD, Deniece PortelaWayne    . CLOSED FRACTURE INTERTROCHANTERIC SECTION FEMUR 04/21/2008    Qualifier: Diagnosis of  By: Sheffield SliderHale MD, Deniece PortelaWayne    . OSTEOARTHRITIS, KNEE, LEFT, MILD 04/21/2008    Qualifier: Diagnosis of  By: Sheffield SliderHale MD, Deniece PortelaWayne    . OSTEOPOROSIS 04/21/2008    Qualifier: Diagnosis of  By: Sheffield SliderHale MD, Deniece PortelaWayne    . HYPOTHYROIDISM 04/21/2008    Qualifier: Diagnosis of  By: Sheffield SliderHale MD, Deniece PortelaWayne    . GALLBLADDER DISEASE 05/22/2008    Qualifier: Diagnosis of  By: Kem ParkinsonBarnes, [employer]    . Acute CHF (congestive heart failure) 01/21/2013  . Essential hypertension, benign 04/21/2008    Qualifier: Diagnosis of  By: Sheffield SliderHale MD, Deniece PortelaWayne    . LBBB 05/23/2008    Qualifier: Diagnosis of  By: Eden EmmsNishan, MD, Harrington ChallengerFACC, Peter Charles     Past Surgical History  Procedure Laterality Date  . Cholecystectomy    . Femur im nail   09/09/2011    Procedure: INTRAMEDULLARY (IM) NAIL FEMORAL;  Surgeon: Kathryne Hitchhristopher Y Blackman, MD;  Location: [facility] OR;  Service: Orthopedics;  Laterality: Left;  distal interlocking screws    VITAL SIGNS BP 110/57  Pulse 66  Ht 5\' 7"  (1.702 m)  Wt 112 lb (50.803 kg)  BMI 17.54 kg/m2   Patient's Medications  New Prescriptions   No medications on file  Previous Medications   ALBUTEROL (PROVENTIL) (5 MG/ML) 0.5% NEBULIZER SOLUTION    Take 0.5 mLs (2.5 mg total) by nebulization every 6 (six) hours as needed for wheezing or shortness of breath.   BUPROPION (WELLBUTRIN) 75 MG TABLET    Take 75 mg by mouth 2 (two) times daily.   CLONIDINE (CATAPRES) 0.1 MG TABLET    Take 0.1 mg by mouth daily as needed.    FUROSEMIDE (LASIX) 20 MG TABLET    Take 20 mg by mouth daily.   LEVOTHYROXINE (SYNTHROID, LEVOTHROID) 100 MCG TABLET    Take 100 mcg by mouth daily before breakfast.   LISINOPRIL (PRINIVIL,ZESTRIL) 2.5 MG TABLET    Take 1 tablet (2.5 mg total) by mouth  daily.   METOPROLOL TARTRATE (LOPRESSOR) 12.5 MG TABS TABLET    Take 0.5 tablets (12.5 mg total) by mouth 2 (two) times daily.   OMEPRAZOLE (PRILOSEC) 10 MG CAPSULE    Take 2 capsules (20 mg total) by mouth daily.   POTASSIUM CHLORIDE (K-DUR,KLOR-CON) 10 MEQ TABLET    Take 10 mEq by mouth daily.   PROMETHAZINE (PHENERGAN) 25 MG TABLET    Take 25 mg by mouth every 6 (six) hours as needed for nausea or vomiting.  Modified Medications   No medications on file  Discontinued Medications   No medications on file    SIGNIFICANT DIAGNOSTIC EXAMS  01-20-13: chest x-ray: Acute pulmonary edema, likely secondary to congestive heart failure. Probable small right pleural effusion.  01-20-13: right hip x-ray: 1. No evidence of acute right femur fracture or dislocation. If the patient has persistent hip pain or inability to bear weight, follow up imaging may be warranted as hip fractures can be radio graphically occult in the elderly.2. Osteopenia with old  pubic rami fractures on the left.  01-21-13: 2-d echo: Left ventricle: The cavity size was severely dilated. Wall thickness was normal. Systolic function was severely reduced. The estimated ejection fraction was in the range of 20% to 25%. Diffuse hypokinesis. - Mitral valve: Calcified annulus. Mild to moderate regurgitation - Left atrium: The atrium was moderately dilated. - Right ventricle: The cavity size was mildly dilated. - Right atrium: The atrium was mildly dilated. - Atrial septum: No defect or patent foramen ovale was identified.  02-19-13: kub: mild to moderate dilatation of loop of large and small bowel consistent with adynamic ileus.   02-21-13; kub: mild ileus without change; no bowel obstruction; scattered coarse calcifications are seen at the right pelvis without change likely related to uterine fibroids.    LABS REVIEWED:   01-20-13: wbc 7.7; hgb 12.4; hct 37.9; mcv 95.9; plt clumped; glucose 131; bun 15; creat 0.65; k+4.2; na++ 139; BNP 31, 867; urine culture: e-coli 01-21-13: wbc 7.2; hgb 12.7; hct 37.6; mcv 95.2.; plt 169; glucose 141; bun 14; creat 0.63; k+3.2; na++ 141; liver normal albumin 3.3; BNP 34, 451 01-25-13: glucose 11; bun 17; creat 0.78; k+4.5; na++ 137  02-08-13: glucose 92; bun 17; crea t0.6 ;k+4.0; na++ 137; BNP 741      Review of Systems  Constitutional: Positive for malaise/fatigue.  HENT: Negative.   Respiratory: Negative for cough and shortness of breath.   Cardiovascular: Negative for chest pain, palpitations and leg swelling.  Gastrointestinal: less nausea is able to eat continues to have diarrheal stools.    Genitourinary: Negative for dysuria.  Musculoskeletal: Negative for joint pain and myalgias.  Neurological: Positive for weakness. Negative for headaches.  Psychiatric/Behavioral: Negative for depression. The patient is not nervous/anxious.      Physical Exam  Constitutional: No distress.  frail  Neck: Neck supple. No JVD present.    Cardiovascular: Normal rate, regular rhythm and intact distal pulses.   Respiratory: Effort normal and breath sounds normal. No respiratory distress. She has no wheezes.  GI: Soft. Bowel sounds are normal. She exhibits no distension. There is no tenderness   Skin: She is not diaphoretic.     ASSESSMENT/ PLAN:  1. Ileus: will await the results of c-diff: will begin metamucil 1 tbs daily for bulking due to his diarrheal stools and will monitor her status.

## 2013-03-02 ENCOUNTER — Non-Acute Institutional Stay (SKILLED_NURSING_FACILITY): Payer: Medicare Other | Admitting: Adult Health

## 2013-03-02 DIAGNOSIS — K219 Gastro-esophageal reflux disease without esophagitis: Secondary | ICD-10-CM

## 2013-03-02 DIAGNOSIS — I10 Essential (primary) hypertension: Secondary | ICD-10-CM

## 2013-03-02 DIAGNOSIS — K567 Ileus, unspecified: Secondary | ICD-10-CM

## 2013-03-02 DIAGNOSIS — K560 Paralytic ileus: Secondary | ICD-10-CM

## 2013-03-02 DIAGNOSIS — I509 Heart failure, unspecified: Secondary | ICD-10-CM

## 2013-03-07 ENCOUNTER — Encounter: Payer: Self-pay | Admitting: Adult Health

## 2013-03-07 NOTE — Progress Notes (Signed)
Patient ID: Elizabeth [name], female   DOB: 05-Aug-1923, 80 y.o.   MRN: [id number]     [facility]  No Known Allergies   Chief Complaint  Patient presents with  . Discharge Note    HPI:  She has been hospitalized for acute chf. She was admitted to this facility for short term rehab. She will be discharged with home health for pt/ot/nursing and home health aid. She will need a neb machine. She will need her prescriptions to be written.    Past Medical History  Diagnosis Date  . Thyroid disease   . Hypertension   . Abnormality of gait 04/21/2008    Qualifier: Diagnosis of  By: Sheffield Slider MD, Deniece Portela    . ALCOHOL ABUSE 04/21/2008    Qualifier: Diagnosis of  By: Sheffield Slider MD, Deniece Portela    . ANEMIA, SECONDARY TO ACUTE BLOOD LOSS 04/21/2008    Qualifier: Diagnosis of  By: Sheffield Slider MD, Deniece Portela    . DEPRESSION/ANXIETY 04/21/2008    Qualifier: Diagnosis of  By: Sheffield Slider MD, Deniece Portela    . Edema 05/23/2008    Qualifier: Diagnosis of  By: Eden Emms, MD, Harrington Challenger   . LOSS OF APPETITE 04/21/2008    Qualifier: Diagnosis of  By: Sheffield Slider MD, Deniece Portela    . UTI 04/21/2008    Qualifier: Diagnosis of  By: Sheffield Slider MD, Deniece Portela    . CLOSED FRACTURE INTERTROCHANTERIC SECTION FEMUR 04/21/2008    Qualifier: Diagnosis of  By: Sheffield Slider MD, Deniece Portela    . OSTEOARTHRITIS, KNEE, LEFT, MILD 04/21/2008    Qualifier: Diagnosis of  By: Sheffield Slider MD, Deniece Portela    . OSTEOPOROSIS 04/21/2008    Qualifier: Diagnosis of  By: Sheffield Slider MD, Deniece Portela    . HYPOTHYROIDISM 04/21/2008    Qualifier: Diagnosis of  By: Sheffield Slider MD, Deniece Portela    . GALLBLADDER DISEASE 05/22/2008    Qualifier: Diagnosis of  By: Kem Parkinson    . Acute CHF (congestive heart failure) 01/21/2013  . Essential hypertension, benign 04/21/2008    Qualifier: Diagnosis of  By: Sheffield Slider MD, Deniece Portela    . LBBB 05/23/2008    Qualifier: Diagnosis of  By: Eden Emms, MD, Harrington Challenger     Past Surgical History  Procedure Laterality Date  . Cholecystectomy    . Femur im nail  09/09/2011    Procedure: INTRAMEDULLARY (IM) NAIL  FEMORAL;  Surgeon: Kathryne Hitch, MD;  Location: [facility] OR;  Service: Orthopedics;  Laterality: Left;  distal interlocking screws    VITAL SIGNS BP 123/58  Pulse 70  Ht 5\' 7"  (1.702 m)  Wt 110 lb (49.896 kg)  BMI 17.22 kg/m2   Patient's Medications  New Prescriptions   No medications on file  Previous Medications   ALBUTEROL (PROVENTIL) (5 MG/ML) 0.5% NEBULIZER SOLUTION    Take 0.5 mLs (2.5 mg total) by nebulization every 6 (six) hours as needed for wheezing or shortness of breath.   BUPROPION (WELLBUTRIN) 75 MG TABLET    Take 75 mg by mouth 2 (two) times daily.   CLONIDINE (CATAPRES) 0.1 MG TABLET    Take 0.1 mg by mouth daily as needed.    FUROSEMIDE (LASIX) 20 MG TABLET    Take 20 mg by mouth daily.   LEVOTHYROXINE (SYNTHROID, LEVOTHROID) 100 MCG TABLET    Take 100 mcg by mouth daily before breakfast.   LISINOPRIL (PRINIVIL,ZESTRIL) 2.5 MG TABLET    Take 1 tablet (2.5 mg total) by mouth daily.   METOPROLOL TARTRATE (LOPRESSOR) 12.5 MG TABS TABLET  Take 0.5 tablets (12.5 mg total) by mouth 2 (two) times daily.   OMEPRAZOLE (PRILOSEC) 10 MG CAPSULE    Take 2 capsules (20 mg total) by mouth daily.   POTASSIUM CHLORIDE (K-DUR,KLOR-CON) 10 MEQ TABLET    Take 10 mEq by mouth daily.   PROMETHAZINE (PHENERGAN) 25 MG TABLET    Take 25 mg by mouth every 6 (six) hours as needed for nausea or vomiting.   PSYLLIUM (METAMUCIL SMOOTH TEXTURE) 28 % PACKET    Take 1 packet by mouth 2 (two) times daily.  Modified Medications   No medications on file  Discontinued Medications   No medications on file    SIGNIFICANT DIAGNOSTIC EXAMS  01-20-13: chest x-ray: Acute pulmonary edema, likely secondary to congestive heart failure. Probable small right pleural effusion.  01-20-13: right hip x-ray: 1. No evidence of acute right femur fracture or dislocation. If the patient has persistent hip pain or inability to bear weight, follow up imaging may be warranted as hip fractures can be radio  graphically occult in the elderly.2. Osteopenia with old pubic rami fractures on the left.  01-21-13: 2-d echo: Left ventricle: The cavity size was severely dilated. Wall thickness was normal. Systolic function was severely reduced. The estimated ejection fraction was in the range of 20% to 25%. Diffuse hypokinesis. - Mitral valve: Calcified annulus. Mild to moderate regurgitation - Left atrium: The atrium was moderately dilated. - Right ventricle: The cavity size was mildly dilated. - Right atrium: The atrium was mildly dilated. - Atrial septum: No defect or patent foramen ovale was identified.  02-19-13: kub: mild to moderate dilatation of loop of large and small bowel consistent with adynamic ileus.   02-21-13; kub: mild ileus without change; no bowel obstruction; scattered coarse calcifications are seen at the right pelvis without change likely related to uterine fibroids.    LABS REVIEWED:   01-20-13: wbc 7.7; hgb 12.4; hct 37.9; mcv 95.9; plt clumped; glucose 131; bun 15; creat 0.65; k+4.2; na++ 139; BNP 31, 867; urine culture: e-coli 01-21-13: wbc 7.2; hgb 12.7; hct 37.6; mcv 95.2.; plt 169; glucose 141; bun 14; creat 0.63; k+3.2; na++ 141; liver normal albumin 3.3; BNP 34, 451 01-25-13: glucose 11; bun 17; creat 0.78; k+4.5; na++ 137  02-08-13: glucose 92; bun 17; crea t0.6 ;k+4.0; na++ 137; BNP 741      Review of Systems  Constitutional: negative for fatigue  HENT: Negative.   Respiratory: Negative for cough and shortness of breath.   Cardiovascular: Negative for chest pain, palpitations and leg swelling.  Gastrointestinal: less nausea is able to eat continues to have 2-3  diarrheal stools.    Genitourinary: Negative for dysuria.  Musculoskeletal: Negative for joint pain and myalgias.  Neurological: . Negative for headaches.  Psychiatric/Behavioral: Negative for depression. The patient is not nervous/anxious.      Physical Exam  Constitutional: No distress.  frail  Neck: Neck  supple. No JVD present.  Cardiovascular: Normal rate, regular rhythm and intact distal pulses.   Respiratory: Effort normal and breath sounds normal. No respiratory distress. She has no wheezes.  GI: Soft. Bowel sounds are normal. She exhibits no distension. There is no tenderness  Skin: She is not diaphoretic.      ASSESSMENT/ PLAN:  Will discharge her to home with home health for pt/ot/rn/home health aid. Will need a neb machine.will increase her metamucil to 2 TBS daily. Her prescriptions have been written.   Time spent with patient 40 minutes.

## 2013-03-17 ENCOUNTER — Non-Acute Institutional Stay (SKILLED_NURSING_FACILITY): Payer: Medicare Other | Admitting: Adult Health

## 2013-03-17 ENCOUNTER — Encounter: Payer: Self-pay | Admitting: Adult Health

## 2013-03-17 DIAGNOSIS — I509 Heart failure, unspecified: Secondary | ICD-10-CM

## 2013-03-17 DIAGNOSIS — I10 Essential (primary) hypertension: Secondary | ICD-10-CM

## 2013-03-17 DIAGNOSIS — K8689 Other specified diseases of pancreas: Secondary | ICD-10-CM | POA: Insufficient documentation

## 2013-03-17 DIAGNOSIS — F1010 Alcohol abuse, uncomplicated: Secondary | ICD-10-CM

## 2013-03-17 DIAGNOSIS — E039 Hypothyroidism, unspecified: Secondary | ICD-10-CM

## 2013-03-17 DIAGNOSIS — F341 Dysthymic disorder: Secondary | ICD-10-CM

## 2013-03-17 DIAGNOSIS — E876 Hypokalemia: Secondary | ICD-10-CM

## 2013-03-17 DIAGNOSIS — K219 Gastro-esophageal reflux disease without esophagitis: Secondary | ICD-10-CM

## 2013-03-17 MED ORDER — PANCRELIPASE (LIP-PROT-AMYL) 12000-38000 UNITS PO CPEP: 2.0000 | ORAL_CAPSULE | Freq: Three times a day (TID) | ORAL | Status: AC
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
## 2013-03-24 ENCOUNTER — Non-Acute Institutional Stay (SKILLED_NURSING_FACILITY): Payer: Medicare Other | Admitting: Adult Health

## 2013-03-24 DIAGNOSIS — I10 Essential (primary) hypertension: Secondary | ICD-10-CM

## 2013-03-24 DIAGNOSIS — K8689 Other specified diseases of pancreas: Secondary | ICD-10-CM

## 2013-03-24 DIAGNOSIS — I509 Heart failure, unspecified: Secondary | ICD-10-CM

## 2013-03-24 NOTE — Progress Notes (Signed)
Patient ID: Elizabeth [name], female   DOB: June 01, 1923, 80 y.o.   MRN: [id number]     [facility]  No Known Allergies  Chief Complaint  Patient presents with  . Discharge Note    HPI:  She will be discharged in the AM. Her previous discharge had to postponed to due to a family emergency. She will need home health for pt/ot. She will not need dme; she will need prescriptions to be written.     Past Medical History  Diagnosis Date  . Thyroid disease   . Hypertension   . Abnormality of gait 04/21/2008    Qualifier: Diagnosis of  By: Sheffield Slider MD, Deniece Portela    . ALCOHOL ABUSE 04/21/2008    Qualifier: Diagnosis of  By: Sheffield Slider MD, Deniece Portela    . ANEMIA, SECONDARY TO ACUTE BLOOD LOSS 04/21/2008    Qualifier: Diagnosis of  By: Sheffield Slider MD, Deniece Portela    . DEPRESSION/ANXIETY 04/21/2008    Qualifier: Diagnosis of  By: Sheffield Slider MD, Deniece Portela    . Edema 05/23/2008    Qualifier: Diagnosis of  By: Eden Emms, MD, Harrington Challenger   . LOSS OF APPETITE 04/21/2008    Qualifier: Diagnosis of  By: Sheffield Slider MD, Deniece Portela    . UTI 04/21/2008    Qualifier: Diagnosis of  By: Sheffield Slider MD, Deniece Portela    . CLOSED FRACTURE INTERTROCHANTERIC SECTION FEMUR 04/21/2008    Qualifier: Diagnosis of  By: Sheffield Slider MD, Deniece Portela    . OSTEOARTHRITIS, KNEE, LEFT, MILD 04/21/2008    Qualifier: Diagnosis of  By: Sheffield Slider MD, Deniece Portela    . OSTEOPOROSIS 04/21/2008    Qualifier: Diagnosis of  By: Sheffield Slider MD, Deniece Portela    . HYPOTHYROIDISM 04/21/2008    Qualifier: Diagnosis of  By: Sheffield Slider MD, Deniece Portela    . GALLBLADDER DISEASE 05/22/2008    Qualifier: Diagnosis of  By: Kem Parkinson    . Acute CHF (congestive heart failure) 01/21/2013  . Essential hypertension, benign 04/21/2008    Qualifier: Diagnosis of  By: Sheffield Slider MD, Deniece Portela    . LBBB 05/23/2008    Qualifier: Diagnosis of  By: Eden Emms, MD, Harrington Challenger     Past Surgical History  Procedure Laterality Date  . Cholecystectomy    . Femur im nail  09/09/2011    Procedure: INTRAMEDULLARY (IM) NAIL FEMORAL;  Surgeon: Kathryne Hitch,  MD;  Location: [facility] OR;  Service: Orthopedics;  Laterality: Left;  distal interlocking screws    VITAL SIGNS BP 131/70  Pulse 66  Ht 5\' 7"  (1.702 m)  Wt 112 lb 9.6 oz (51.075 kg)  BMI 17.63 kg/m2   Patient's Medications  New Prescriptions   No medications on file  Previous Medications   ALBUTEROL (PROVENTIL) (5 MG/ML) 0.5% NEBULIZER SOLUTION    Take 0.5 mLs (2.5 mg total) by nebulization every 6 (six) hours as needed for wheezing or shortness of breath.   BUPROPION (WELLBUTRIN) 75 MG TABLET    Take 75 mg by mouth 2 (two) times daily.   CLONIDINE (CATAPRES) 0.1 MG TABLET    Take 0.1 mg by mouth daily as needed.    FUROSEMIDE (LASIX) 20 MG TABLET    Take 20 mg by mouth daily.   LEVOTHYROXINE (SYNTHROID, LEVOTHROID) 100 MCG TABLET    Take 100 mcg by mouth daily before breakfast.   LIPASE/PROTEASE/AMYLASE (CREON-12/PANCREASE) 12000 UNITS CPEP CAPSULE    Take 2 capsules by mouth 3 (three) times daily before meals. And take 40981 units with snacks   LISINOPRIL (PRINIVIL,ZESTRIL) 2.5 MG  TABLET    Take 1 tablet (2.5 mg total) by mouth daily.   METOPROLOL TARTRATE (LOPRESSOR) 12.5 MG TABS TABLET    Take 0.5 tablets (12.5 mg total) by mouth 2 (two) times daily.   OMEPRAZOLE (PRILOSEC) 10 MG CAPSULE    Take 2 capsules (20 mg total) by mouth daily.   POTASSIUM CHLORIDE (K-DUR,KLOR-CON) 10 MEQ TABLET    Take 10 mEq by mouth daily.   PROMETHAZINE (PHENERGAN) 25 MG TABLET    Take 25 mg by mouth every 6 (six) hours as needed for nausea or vomiting.  Modified Medications   No medications on file  Discontinued Medications   No medications on file    SIGNIFICANT DIAGNOSTIC EXAMS  01-20-13: chest x-ray: Acute pulmonary edema, likely secondary to congestive heart failure. Probable small right pleural effusion.  01-20-13: right hip x-ray: 1. No evidence of acute right femur fracture or dislocation. If the patient has persistent hip pain or inability to bear weight, follow up imaging may be warranted as  hip fractures can be radio graphically occult in the elderly.2. Osteopenia with old pubic rami fractures on the left.  01-21-13: 2-d echo: Left ventricle: The cavity size was severely dilated. Wall thickness was normal. Systolic function was severely reduced. The estimated ejection fraction was in the range of 20% to 25%. Diffuse hypokinesis. - Mitral valve: Calcified annulus. Mild to moderate regurgitation - Left atrium: The atrium was moderately dilated. - Right ventricle: The cavity size was mildly dilated. - Right atrium: The atrium was mildly dilated. - Atrial septum: No defect or patent foramen ovale was identified.  02-19-13: kub: mild to moderate dilatation of loop of large and small bowel consistent with adynamic ileus.   02-21-13; kub: mild ileus without change; no bowel obstruction; scattered coarse calcifications are seen at the right pelvis without change likely related to uterine fibroids.    LABS REVIEWED:   01-20-13: wbc 7.7; hgb 12.4; hct 37.9; mcv 95.9; plt clumped; glucose 131; bun 15; creat 0.65; k+4.2; na++ 139; BNP 31, 867; urine culture: e-coli 01-21-13: wbc 7.2; hgb 12.7; hct 37.6; mcv 95.2.; plt 169; glucose 141; bun 14; creat 0.63; k+3.2; na++ 141; liver normal albumin 3.3; BNP 34, 451 01-25-13: glucose 11; bun 17; creat 0.78; k+4.5; na++ 137  02-08-13: glucose 92; bun 17; creat0.6 ;k+4.0; na++ 137; BNP 741      Review of Systems  Constitutional: negative for fatigue  HENT: Negative.   Respiratory: Negative for cough and shortness of breath.   Cardiovascular: Negative for chest pain, palpitations and leg swelling.  Gastrointestinal: no nausea is able to eat continues to have 2-3  diarrheal stools.    Genitourinary: Negative for dysuria.  Musculoskeletal: Negative for joint pain and myalgias.  Neurological: . Negative for headaches.  Psychiatric/Behavioral: Negative for depression. The patient is not nervous/anxious.      Physical Exam  Constitutional: No  distress.  frail  Neck: Neck supple. No JVD present.  Cardiovascular: Normal rate, regular rhythm and intact distal pulses.   Respiratory: Effort normal and breath sounds normal. No respiratory distress. She has no wheezes.  GI: Soft. Bowel sounds are normal. She exhibits no distension. There is no tenderness  M/S: is able to move all extremities.  Skin: She is not diaphoretic.      ASSESSMENT/ PLAN:   Will discharge her to home with home health for pt/ot; she will not need dme. Her prescriptions have been written.   Time spent with patient 35 minutes.  Ok Edwards NP [facility]  Contact [phone] Monday through Friday 8am- 5pm  After hours call [phone]

## 2013-04-21 ENCOUNTER — Other Ambulatory Visit: Payer: Self-pay | Admitting: Adult Health

## 2013-08-01 ENCOUNTER — Encounter (HOSPITAL_COMMUNITY): Payer: Self-pay | Admitting: Emergency Medicine

## 2013-08-01 ENCOUNTER — Emergency Department (HOSPITAL_COMMUNITY): Payer: Medicare Other

## 2013-08-01 ENCOUNTER — Emergency Department (HOSPITAL_COMMUNITY)
Admission: EM | Admit: 2013-08-01 | Discharge: 2013-08-01 | Disposition: A | Discharge status: Home / Self Care | Payer: Medicare Other | Attending: Emergency Medicine | Admitting: Emergency Medicine

## 2013-08-01 DIAGNOSIS — Z8719 Personal history of other diseases of the digestive system: Secondary | ICD-10-CM | POA: Insufficient documentation

## 2013-08-01 DIAGNOSIS — E039 Hypothyroidism, unspecified: Secondary | ICD-10-CM | POA: Insufficient documentation

## 2013-08-01 DIAGNOSIS — I509 Heart failure, unspecified: Secondary | ICD-10-CM | POA: Insufficient documentation

## 2013-08-01 DIAGNOSIS — I447 Left bundle-branch block, unspecified: Secondary | ICD-10-CM | POA: Insufficient documentation

## 2013-08-01 DIAGNOSIS — Z862 Personal history of diseases of the blood and blood-forming organs and certain disorders involving the immune mechanism: Secondary | ICD-10-CM | POA: Insufficient documentation

## 2013-08-01 DIAGNOSIS — Y92009 Unspecified place in unspecified non-institutional (private) residence as the place of occurrence of the external cause: Secondary | ICD-10-CM | POA: Insufficient documentation

## 2013-08-01 DIAGNOSIS — M810 Age-related osteoporosis without current pathological fracture: Secondary | ICD-10-CM

## 2013-08-01 DIAGNOSIS — Z79899 Other long term (current) drug therapy: Secondary | ICD-10-CM | POA: Insufficient documentation

## 2013-08-01 DIAGNOSIS — F329 Major depressive disorder, single episode, unspecified: Secondary | ICD-10-CM | POA: Insufficient documentation

## 2013-08-01 DIAGNOSIS — Y9301 Activity, walking, marching and hiking: Secondary | ICD-10-CM | POA: Insufficient documentation

## 2013-08-01 DIAGNOSIS — F3289 Other specified depressive episodes: Secondary | ICD-10-CM | POA: Insufficient documentation

## 2013-08-01 DIAGNOSIS — S32009A Unspecified fracture of unspecified lumbar vertebra, initial encounter for closed fracture: Secondary | ICD-10-CM | POA: Insufficient documentation

## 2013-08-01 DIAGNOSIS — F411 Generalized anxiety disorder: Secondary | ICD-10-CM | POA: Insufficient documentation

## 2013-08-01 DIAGNOSIS — S32000A Wedge compression fracture of unspecified lumbar vertebra, initial encounter for closed fracture: Secondary | ICD-10-CM

## 2013-08-01 DIAGNOSIS — Z87440 Personal history of urinary (tract) infections: Secondary | ICD-10-CM | POA: Insufficient documentation

## 2013-08-01 DIAGNOSIS — X500XXA Overexertion from strenuous movement or load, initial encounter: Secondary | ICD-10-CM | POA: Insufficient documentation

## 2013-08-01 DIAGNOSIS — I10 Essential (primary) hypertension: Secondary | ICD-10-CM | POA: Insufficient documentation

## 2013-08-01 MED ORDER — TRAMADOL HCL 50 MG PO TABS
50.0000 mg | ORAL_TABLET | Freq: Once | ORAL | Status: AC
Start: 2013-08-01 — End: 2013-08-01
  Administered 2013-08-01: 50 mg via ORAL
  Filled 2013-08-01: qty 1

## 2013-08-01 MED ORDER — TRAMADOL HCL 50 MG PO TABS: 50.0000 mg | ORAL_TABLET | Freq: Four times a day (QID) | ORAL | Status: DC | PRN

## 2013-08-01 NOTE — Discharge Instructions (Signed)
Rest.  Take advil as need for pain. You may also take ultram as need for pain - no driving when taking. Follow up with home health services in the next 1-2 days - they should be able to assist you at home, as well as arranging physical therapy.  In addition, the home health service has social workers that can work with you and your doctors office to facilitate extended care facility placement should you wish to pursue that option.  Also follow up with your doctor later this week regarding your compression fractures - if your pain is not controlled with the prescription medication, discuss with your doctor setting up a procedure called kyphoplasty as an outpatient. Return to ER if worse, new symptoms, numbness/weakness, other concern.    Back, Compression Fracture A compression fracture happens when a force is put upon the length of your spine. Slipping and falling on your bottom are examples of such a force. When this happens, sometimes the force is great enough to compress the building blocks (vertebral bodies) of your spine. Although this causes a lot of pain, this can usually be treated at home, unless your caregiver feels hospitalization is needed for pain control. Your backbone (spinal column) is made up of 24 main vertebral bodies in addition to the sacrum and coccyx (see illustration). These are held together by tough fibrous tissues (ligaments) and by support of your muscles. Nerve roots pass through the openings between the vertebrae. A sudden wrenching move, injury, or a fall may cause a compression fracture of one of the vertebral bodies. This may result in back pain or spread of pain into the belly (abdomen), the buttocks, and down the leg into the foot. Pain may also be created by muscle spasm alone. Large studies have been undertaken to determine the best possible course of action to help your back following injury and also to prevent future problems. The recommendations are as  follows. FOLLOWING A COMPRESSION FRACTURE: Do the following only if advised by your caregiver.   If a back brace has been suggested or provided, wear it as directed.  DO NOT stop wearing the back brace unless instructed by your caregiver.  When allowed to return to regular activities, avoid a sedentary life style. Actively exercise. Sporadic weekend binges of tennis, racquetball, water skiing, may actually aggravate or create problems, especially if you are not in condition for that activity.  Avoid sports requiring sudden body movements until you are in condition for them. Swimming and walking are safer activities.  Maintain good posture.  Avoid obesity.  If not already done, you should have a DEXA scan. Based on the results, be treated for osteoporosis. FOLLOWING ACUTE (SUDDEN) INJURY:  Only take over-the-counter or prescription medicines for pain, discomfort, or fever as directed by your caregiver.  Use bed rest for only the most extreme acute episode. Prolonged bed rest may aggravate your condition. Ice used for acute conditions is effective. Use a large plastic bag filled with ice. Wrap it in a towel. This also provides excellent pain relief. This may be continuous. Or use it for 30 minutes every 2 hours during acute phase, then as needed. Heat for 30 minutes prior to activities is helpful.  As soon as the acute phase (the time when your back is too painful for you to do normal activities) is over, it is important to resume normal activities and work Arboriculturist. Back injuries can cause potentially marked changes in lifestyle. So it is important to attack  these problems aggressively.  See your caregiver for continued problems. He or she can help or refer you for appropriate exercises, physical therapy and work hardening if needed.  If you are given narcotic medications for your condition, for the next 24 hours DO NOT:  Drive  Operate machinery or power tools.  Sign legal  documents.  DO NOT drink alcohol, take sleeping pills or other medications that may interfere with treatment. If your caregiver has given you a follow-up appointment, it is very important to keep that appointment. Not keeping the appointment could result in a chronic or permanent injury, pain, and disability. If there is any problem keeping the appointment, you must call back to this facility for assistance.  SEEK IMMEDIATE MEDICAL CARE IF:  You develop numbness, tingling, weakness, or problems with the use of your arms or legs.  You develop severe back pain not relieved with medications.  You have changes in bowel or bladder control.  You have increasing pain in any areas of the body. Document Released: 01/20/2005 Document Revised: 04/14/2011 Document Reviewed: 08/25/2007 [facility] Patient Information 2015 [place], [place]. This information is not intended to replace advice given to you by your health care provider. Make sure you discuss any questions you have with your health care provider.    Osteoporosis Throughout your life, your body breaks down old bone and replaces it with new bone. As you get older, your body does not replace bone as quickly as it breaks it down. By the age of 30 years, most people begin to gradually lose bone because of the imbalance between bone loss and replacement. Some people lose more bone than others. Bone loss beyond a specified normal degree is considered osteoporosis.  Osteoporosis affects the strength and durability of your bones. The inside of the ends of your bones and your flat bones, like the bones of your pelvis, look like honeycomb, filled with tiny open spaces. As bone loss occurs, your bones become less dense. This means that the open spaces inside your bones become bigger and the walls between these spaces become thinner. This makes your bones weaker. Bones of a person with osteoporosis can become so weak that they can break (fracture) during minor  accidents, such as a simple fall. CAUSES  The following factors have been associated with the development of osteoporosis:  Smoking.  Drinking more than 2 alcoholic drinks several days per week.  Long-term use of certain medicines:  Corticosteroids.  Chemotherapy medicines.  Thyroid medicines.  Antiepileptic medicines.  Gonadal hormone suppression medicine.  Immunosuppression medicine.  Being underweight.  Lack of physical activity.  Lack of exposure to the sun. This can lead to vitamin D deficiency.  Certain medical conditions:  Certain inflammatory bowel diseases, such as Crohn disease and ulcerative colitis.  Diabetes.  Hyperthyroidism.  Hyperparathyroidism. RISK FACTORS Anyone can develop osteoporosis. However, the following factors can increase your risk of developing osteoporosis:  Gender--Women are at higher risk than men.  Age--Being older than 50 years increases your risk.  Ethnicity--White and Asian people have an increased risk.  Weight --Being extremely underweight can increase your risk of osteoporosis.  Family history of osteoporosis--Having a family member who has developed osteoporosis can increase your risk. SYMPTOMS  Usually, people with osteoporosis have no symptoms.  DIAGNOSIS  Signs during a physical exam that may prompt your caregiver to suspect osteoporosis include:  Decreased height. This is usually caused by the compression of the bones that form your spine (vertebrae) because they have weakened  and become fractured.  A curving or rounding of the upper back (kyphosis). To confirm signs of osteoporosis, your caregiver may request a procedure that uses 2 low-dose X-ray beams with different levels of energy to measure your bone mineral density (dual-energy X-ray absorptiometry [DXA]). Also, your caregiver may check your level of vitamin D. TREATMENT  The goal of osteoporosis treatment is to strengthen bones in order to decrease the risk  of bone fractures. There are different types of medicines available to help achieve this goal. Some of these medicines work by slowing the processes of bone loss. Some medicines work by increasing bone density. Treatment also involves making sure that your levels of calcium and vitamin D are adequate. PREVENTION  There are things you can do to help prevent osteoporosis. Adequate intake of calcium and vitamin D can help you achieve optimal bone mineral density. Regular exercise can also help, especially resistance and weight-bearing activities. If you smoke, quitting smoking is an important part of osteoporosis prevention. MAKE SURE YOU:  Understand these instructions.  Will watch your condition.  Will get help right away if you are not doing well or get worse. FOR MORE INFORMATION www.[email or web address] and [email or web address] Document Released: 10/30/2004 Document Revised: 05/17/2012 Document Reviewed: 01/04/2011 [facility] Patient Information 2015 [place], [place]. This information is not intended to replace advice given to you by your health care provider. Make sure you discuss any questions you have with your health care provider.   Vertebral Fracture You have a fracture of one or more vertebra. These are the bony parts that form the spine. Minor vertebral fractures happen when people fall. Osteoporosis is associated with many of these fractures. Hospital care may not be necessary for minor compression fractures that are stable. However, multiple fractures of the spine or unstable injuries can cause severe pain and even damage the spinal cord. A spinal cord injury may cause paralysis, numbness, or loss of normal bowel and bladder control.  Normally there is pain and stiffness in the back for 3 to 6 weeks after a vertebral fracture. Bed rest for several days, pain medicine, and a slow return to activity is often the only treatment that is needed depending on the location of the fracture. Neck and back braces may be  helpful in reducing pain and increasing mobility. When your pain allows, you should begin walking or swimming to help maintain your endurance. Exercises to improve motion and to strengthen the back may also be useful after the initial pain improves. Treatment for osteoporosis may be essential for full recovery. This will help reduce your risk of vertebral fractures with a future fall. During the first few days after a spine fracture you may feel nauseated or vomit. If this is severe, hospital care with IV fluids will be needed.  Arrange for follow-up care as recommended to assure proper long-term care and prevention of further spine injury.  SEEK IMMEDIATE MEDICAL CARE IF:  You have increasing pain, vomiting, or are unable to move around at all.  You develop numbness, tingling, weakness, or paralysis of any part of your body.  You develop a loss of normal bowel or bladder control.  You have difficulty breathing, cough, fever, chest or abdominal pain. MAKE SURE YOU:   Understand these instructions.  Will watch your condition.  Will get help right away if you are not doing well or get worse. Document Released: 02/28/2004 Document Revised: 04/14/2011 Document Reviewed: 09/12/2008 [facility] Patient Information 2015 [place], [place]. This information is not intended to replace  advice given to you by your health care provider. Make sure you discuss any questions you have with your health care provider   Balloon Kyphoplasty Balloon kyphoplasty is a procedure in which orthopedic balloons are used to gently raise a collapsed vertebral body in an attempt to alleviate pain. This condition is also called a vertebral compression fracture, VCF or VTF. Most often the cause of this collapse is due to osteoporosis of the vertebral body, which makes the bone susceptible to minor trauma. Osteoporosis is a condition that comes on with aging. Osteoporosis is due to a loss of mineral from the bone. This causes a  softening of the bones. The diagnosis of these fractures is usually made with X-rays or magnetic resonance imaging (MRI). Some advantages of this procedure are:  Pain relief.  Restoration of vertebral body height.  Correction of spinal deformity.  Relatively low complication rate. The procedure is usually done by orthopedic surgeons, neurosurgeons, interventional radiologists, and interventional neuroradiologists who specialize in treating the spine with balloon kyphoplasty.  This procedure is not useful for:  Patients with young, healthy bones or those who have sustained a vertebral body fracture or collapse in a major accident.  Patients with spinal curvature such as scoliosis or kyphosis that is due to causes other than osteoporosis.  Patients who suffer from spinal stenosis or herniated discs with nerve or spinal cord compression and loss of neurological function not associated with a vertebral compression fracture.  Patients with known metastatic disease of the spine. THE BENEFITS OF BALLOON KYPHOPLASTY INCLUDE:  Reduction in back pain. If there is pain due to the procedure, it will typically lessen within two weeks.  Improved quality of life.  Improved mobility (you can get around better).  Improved ability to perform activities of daily living. PROCEDURE  The kyphoplasty procedure involves the use of a balloon to restore the vertebral body height and shape. This is followed by placement of bone cement to strengthen it. The procedure may be done under intravenous sedation, local anesthetic, or general anesthetic. The patient lies face-down on the operating room table. X-ray machines are used to show the collapsed bones.  The surgeon makes two small incisions. A tube is then inserted into the center of the vertebral body. Through this tube, balloons are placed in the vertebral body. Then the balloons are inflated. This creates a cavity. This pushes the bone back toward its normal  height and shape.  Once the cavity is created, the surgeon removes the inflatable balloon. The cement is mixed and used to fill the cavity in a slow and controlled fashion. The cement hardens. Then the surgeon takes out the tubes. The incisions are closed with a single stitch. Patients usually go home the same day. Patients can go back to all normal activities of daily living as soon as possible. There are no restrictions.  RISKS AND COMPLICATIONS As with any surgery, there are potential risks. Although the procedure is designed to minimize risks, complications may occur. Be sure to discuss the risks with your caregiver. Balloon kyphoplasty is not right for all patients. Complications may require more treatments. Complications that can occur include:  The usual risks of local or general anesthetics apply. These risks depend on the patient's overall health.  Heart attack (myocardial infarction).  Stroke (cerebrovascular accident).  A blockage in the lung (pulmonary embolism - there is a very small chance of the cement traveling to lungs).  There is a small risk of the bone cement leaking from  within the boundaries of the vertebral body. In most cases, this rare event does not cause any problems. However, if the cement does leak it may cause:  Pain.  Altered sensation.  Very rarely, paralysis.  Should the cement leak further, more significant surgery may be needed to stop the irritation of the nerves or spinal cord.  In very rare circumstances, the cement may irritate or damage the spinal cord or nerves.  Heart stops beating (cardiac arrest).  Excessive bleeding (hemorrhage).  Infection (there is a small chance of the cement block becoming infected at the time of surgery or even years later). Document Released: 12/27/2003 Document Revised: 05/17/2012 Document Reviewed: 08/01/2008 [facility] Patient Information 2015 [place], [place]. This information is not intended to replace advice given  to you by your health care provider. Make sure you discuss any questions you have with your health care provider.   Fall Prevention and Home Safety Falls cause injuries and can affect all age groups. It is possible to use preventive measures to significantly decrease the likelihood of falls. There are many simple measures which can make your home safer and prevent falls. OUTDOORS  Repair cracks and edges of walkways and driveways.  Remove high doorway thresholds.  Trim shrubbery on the main path into your home.  Have good outside lighting.  Clear walkways of tools, rocks, debris, and clutter.  Check that handrails are not broken and are securely fastened. Both sides of steps should have handrails.  Have leaves, snow, and ice cleared regularly.  Use sand or salt on walkways during winter months.  In the garage, clean up grease or oil spills. BATHROOM  Install night lights.  Install grab bars by the toilet and in the tub and shower.  Use non-skid mats or decals in the tub or shower.  Place a plastic non-slip stool in the shower to sit on, if needed.  Keep floors dry and clean up all water on the floor immediately.  Remove soap buildup in the tub or shower on a regular basis.  Secure bath mats with non-slip, double-sided rug tape.  Remove throw rugs and tripping hazards from the floors. BEDROOMS  Install night lights.  Make sure a bedside light is easy to reach.  Do not use oversized bedding.  Keep a telephone by your bedside.  Have a firm chair with side arms to use for getting dressed.  Remove throw rugs and tripping hazards from the floor. KITCHEN  Keep handles on pots and pans turned toward the center of the stove. Use back burners when possible.  Clean up spills quickly and allow time for drying.  Avoid walking on wet floors.  Avoid hot utensils and knives.  Position shelves so they are not too high or low.  Place commonly used objects within easy  reach.  If necessary, use a sturdy step stool with a grab bar when reaching.  Keep electrical cables out of the way.  Do not use floor polish or wax that makes floors slippery. If you must use wax, use non-skid floor wax.  Remove throw rugs and tripping hazards from the floor. STAIRWAYS  Never leave objects on stairs.  Place handrails on both sides of stairways and use them. Fix any loose handrails. Make sure handrails on both sides of the stairways are as long as the stairs.  Check carpeting to make sure it is firmly attached along stairs. Make repairs to worn or loose carpet promptly.  Avoid placing throw rugs at the top or  bottom of stairways, or properly secure the rug with carpet tape to prevent slippage. Get rid of throw rugs, if possible.  Have an electrician put in a light switch at the top and bottom of the stairs. OTHER FALL PREVENTION TIPS  Wear low-heel or rubber-soled shoes that are supportive and fit well. Wear closed toe shoes.  When using a stepladder, make sure it is fully opened and both spreaders are firmly locked. Do not climb a closed stepladder.  Add color or contrast paint or tape to grab bars and handrails in your home. Place contrasting color strips on first and last steps.  Learn and use mobility aids as needed. Install an electrical emergency response system.  Turn on lights to avoid dark areas. Replace light bulbs that burn out immediately. Get light switches that glow.  Arrange furniture to create clear pathways. Keep furniture in the same place.  Firmly attach carpet with non-skid or double-sided tape.  Eliminate uneven floor surfaces.  Select a carpet pattern that does not visually hide the edge of steps.  Be aware of all pets. OTHER HOME SAFETY TIPS  Set the water temperature for 120 F (48.8 C).  Keep emergency numbers on or near the telephone.  Keep smoke detectors on every level of the home and near sleeping areas. Document Released:  01/10/2002 Document Revised: 07/22/2011 Document Reviewed: 04/11/2011 [facility] Patient Information 2015 [place], [place]. This information is not intended to replace advice given to you by your health care provider. Make sure you discuss any questions you have with your health care provider.

## 2013-08-01 NOTE — ED Notes (Signed)
Per EMS: pt from home. pt was walking at home with walker and she lifted her walker to get over a rug. Pt did not fall but felt a twinge in her back. Pt states pain has increased throughout the past 2 days. Pt lives on the second floor apartment and usually gets up and down stairs.

## 2013-08-01 NOTE — ED Provider Notes (Signed)
CSN: [id number]     Arrival date & time 08/01/13  0709 History   First MD Initiated Contact with Patient 08/01/13 [phone]     Chief Complaint  Patient presents with  . Back Pain     (Consider location/radiation/quality/duration/timing/severity/associated sxs/prior Treatment) Patient is a 80 y.o. female presenting with back pain. The history is provided by the patient.  Back Pain Associated symptoms: no abdominal pain, no chest pain, no dysuria, no fever, no numbness and no weakness   pt c/o low back pain for the past couple days. Constant. Dull, moderate, non radiating. Worse w bending at waist and positional changes. Denies hx chronic back pain, back surgery, ddd, or compression fractures. No radicular pain. No numbness/weakness of extremities. No perineal numbness. No urinary retention or incontinence. Denies hx AAA. No fever or chills.  States had been on thick carpet, and was trying to lift/carry walker in front of her when pain started. No fall.       Past Medical History  Diagnosis Date  . Thyroid disease   . Hypertension   . Abnormality of gait 04/21/2008    Qualifier: Diagnosis of  By: Sheffield SliderHale MD, Deniece PortelaWayne    . ALCOHOL ABUSE 04/21/2008    Qualifier: Diagnosis of  By: Sheffield SliderHale MD, Deniece PortelaWayne    . ANEMIA, SECONDARY TO ACUTE BLOOD LOSS 04/21/2008    Qualifier: Diagnosis of  By: Sheffield SliderHale MD, Deniece PortelaWayne    . DEPRESSION/ANXIETY 04/21/2008    Qualifier: Diagnosis of  By: Sheffield SliderHale MD, Deniece PortelaWayne    . Edema 05/23/2008    Qualifier: Diagnosis of  By: Eden EmmsNishan, MD, Harrington ChallengerFACC, Peter Charles   . LOSS OF APPETITE 04/21/2008    Qualifier: Diagnosis of  By: Sheffield SliderHale MD, Deniece PortelaWayne    . UTI 04/21/2008    Qualifier: Diagnosis of  By: Sheffield SliderHale MD, Deniece PortelaWayne    . CLOSED FRACTURE INTERTROCHANTERIC SECTION FEMUR 04/21/2008    Qualifier: Diagnosis of  By: Sheffield SliderHale MD, Deniece PortelaWayne    . OSTEOARTHRITIS, KNEE, LEFT, MILD 04/21/2008    Qualifier: Diagnosis of  By: Sheffield SliderHale MD, Deniece PortelaWayne    . OSTEOPOROSIS 04/21/2008    Qualifier: Diagnosis of  By: Sheffield SliderHale MD, Deniece PortelaWayne    .  HYPOTHYROIDISM 04/21/2008    Qualifier: Diagnosis of  By: Sheffield SliderHale MD, Deniece PortelaWayne    . GALLBLADDER DISEASE 05/22/2008    Qualifier: Diagnosis of  By: Kem ParkinsonBarnes, Kimalexis    . Acute CHF (congestive heart failure) 01/21/2013  . Essential hypertension, benign 04/21/2008    Qualifier: Diagnosis of  By: Sheffield SliderHale MD, Deniece PortelaWayne    . LBBB 05/23/2008    Qualifier: Diagnosis of  By: Eden EmmsNishan, MD, Harrington ChallengerFACC, Peter Charles    Past Surgical History  Procedure Laterality Date  . Cholecystectomy    . Femur im nail  09/09/2011    Procedure: INTRAMEDULLARY (IM) NAIL FEMORAL;  Surgeon: Kathryne Hitchhristopher Y Blackman, MD;  Location: [facility] OR;  Service: Orthopedics;  Laterality: Left;  distal interlocking screws   History reviewed. No pertinent family history. History  Substance Use Topics  . Smoking status: Never Smoker   . Smokeless tobacco: Current User    Types: Snuff  . Alcohol Use: Yes     Comment: rare   OB History   Grav Para Term Preterm Abortions TAB SAB Ect Mult Living                 Review of Systems  Constitutional: Negative for fever and chills.  HENT: Negative for sore throat.   Eyes: Negative for visual disturbance.  Respiratory:  Negative for cough and shortness of breath.   Cardiovascular: Negative for chest pain and leg swelling.  Gastrointestinal: Negative for abdominal pain.  Genitourinary: Negative for dysuria and hematuria.  Musculoskeletal: Positive for back pain. Negative for neck pain.  Skin: Negative for rash.  Neurological: Negative for weakness and numbness.  Hematological: Does not bruise/bleed easily.  Psychiatric/Behavioral: Negative for confusion.      Allergies  Review of patient's allergies indicates no known allergies.  Home Medications   Prior to Admission medications   Medication Sig Start Date End Date Taking? Authorizing Provider  albuterol (PROVENTIL) (5 MG/ML) 0.5% nebulizer solution Take 0.5 mLs (2.5 mg total) by nebulization every 6 (six) hours as needed for wheezing or shortness  of breath. 01/25/13   [name], MD  buPROPion (WELLBUTRIN) 75 MG tablet Take 75 mg by mouth 2 (two) times daily.    Historical Provider, MD  cloNIDine (CATAPRES) 0.1 MG tablet Take 0.1 mg by mouth daily as needed.     Historical Provider, MD  furosemide (LASIX) 20 MG tablet Take 20 mg by mouth daily.    Historical Provider, MD  levothyroxine (SYNTHROID, LEVOTHROID) 100 MCG tablet Take 100 mcg by mouth daily before breakfast.    Historical Provider, MD  lipase/protease/amylase (CREON-12/PANCREASE) 12000 UNITS CPEP capsule Take 2 capsules by mouth 3 (three) times daily before meals. And take 16109 units with snacks 03/17/13   Sharee Holster, NP  lisinopril (PRINIVIL,ZESTRIL) 2.5 MG tablet Take 1 tablet (2.5 mg total) by mouth daily. 01/25/13   [name], MD  metoprolol tartrate (LOPRESSOR) 12.5 mg TABS tablet Take 0.5 tablets (12.5 mg total) by mouth 2 (two) times daily. 01/25/13   [name], MD  omeprazole (PRILOSEC) 10 MG capsule Take 2 capsules (20 mg total) by mouth daily. 02/19/13   Sharee Holster, NP  potassium chloride (K-DUR,KLOR-CON) 10 MEQ tablet Take 10 mEq by mouth daily.    Historical Provider, MD  promethazine (PHENERGAN) 25 MG tablet Take 25 mg by mouth every 6 (six) hours as needed for nausea or vomiting.    Historical Provider, MD   BP 151/78  Pulse 59  Temp(Src) 97.9 F (36.6 C) (Oral)  Resp 23  SpO2 97% Physical Exam  Nursing note and vitals reviewed. Constitutional: She appears well-developed and well-nourished. No distress.  HENT:  Head: Atraumatic.  Mouth/Throat: Oropharynx is clear and moist.  Eyes: Conjunctivae are normal. No scleral icterus.  Neck: Neck supple. No tracheal deviation present.  Cardiovascular: Normal rate.   Pulmonary/Chest: Effort normal. No respiratory distress.  Abdominal: Soft. Normal appearance. She exhibits no distension. There is no tenderness.  No pulsatile mass  Genitourinary:  No cva tenderness  Musculoskeletal: She  exhibits no edema.  Upper lumbar tenderness, otherwise CTLS spine, non tender, aligned, no step off.   Neurological: She is alert. She displays normal reflexes.  Motor intact bil LE, str 5/5, sens intact.   Skin: Skin is warm and dry. No rash noted.  No shingles/rash in area of pain  Psychiatric: She has a normal mood and affect.    ED Course  Procedures (including critical care time)  Dg Lumbar Spine Complete  08/01/2013   CLINICAL DATA:  Low back pain.  No known injury.  EXAM: LUMBAR SPINE - COMPLETE 4+ VIEW  COMPARISON:  None.  FINDINGS: There is a L4 compression fracture of uncertain age. There is also mild central depression of the L2 vertebral body. No destructive bony changes. The alignment is maintained.  Moderate osteoporosis. Moderate multilevel facet disease but no definite pars defects. There are aortoiliac vascular calcifications but no definite aneurysm. Calcified uterine fibroids are noted.  IMPRESSION: L2 and L4 compression deformities of uncertain age.   Electronically Signed   By: Loralie ChampagneMark  Gallerani M.D.   On: 08/01/2013 08:27     MDM  Xr.  Ultram po.  Reviewed nursing notes and prior charts for additional history.   No radicular pain or numbness/weakness.   Pain improved post meds.  Discussed xrays w pt including osteoporosis and compression fractures.  Pt lives independently, now w compression fractures - Case manager consulted re home home eval, home safety eval, PT, home health social work for possible eventual needing placement.   Discussed pcp follow up this week, discuss w pcp possible arrangement kyphoplasty if pain not controlled w home meds.  Family present.    Suzi RootsKevin E Steinl, MD 08/01/13 1045

## 2013-08-01 NOTE — Progress Notes (Signed)
  CARE MANAGEMENT ED NOTE 08/01/2013  Patient:  Elizabeth [name],Elizabeth [name]   Account Number:  [id number]  Date Initiated:  08/01/2013  Documentation initiated by:  Ferdinand CavaSCHETTINO,ANDREA  Subjective/Objective Assessment:   80 yo Medicare female presenting to the ED with back pain     Subjective/Objective Assessment Detail:   Associated symptoms: no abdominal pain, no chest pain, no dysuria, no fever, no numbness and no weakness  pt c/o low back pain for the past couple days. Constant. Dull, moderate, non radiating. Worse w bending at waist and positional changes. Denies hx chronic back pain, back surgery, ddd, or compression fractures. No radicular pain. No numbness/weakness of extremities. No perineal numbness. No urinary retention or incontinence. Denies hx AAA. No fever or chills.  States had been on thick carpet, and was trying to lift/carry walker in front of her when pain started. No fall.     Action/Plan:   [facility], PT, aide, and [facility]   Action/Plan Detail:   Anticipated DC Date:       Status Recommendation to Physician:   Result of Recommendation:  Agreed    DC Planning Services  CM consult  Other    Choice offered to / List presented to:  C-2 HC POA / Guardian     HH arranged  HH-1 RN  HH-2 PT  HH-3 OT  HH-4 NURSE'S AIDE  HH-6 SOCIAL WORKER      HH agency  [facility]    Status of service:  Completed, signed off  ED Comments:   ED Comments Detail:  CM consulted to discuss home health with patient and family. CM spoke with patient and her nephew, Elizabeth [name]. Discussed home health services and the patient and nephew are aware of HH and stated that [facility]. They opted to go with [facility] this time. Services were discussed and initiated HH RN, PT, OT, aide, and SW. Also provided a list of private duty sitters for extra help in the house with the understanding that private duty is  an out of pocket expense. [name] verbalized concerns regarding a long term plan for the patient and difficulty with her remaining in the home. Explained to Elizabeth [name] that he can work with home health and discuss placement, whether SNF or [facility]F [facility]. Elizabeth PostAlex and the patient were appreciative for services. [facility]. EDP made aware and face to face entered into system. No further CM needs identified.

## 2013-08-03 ENCOUNTER — Emergency Department (HOSPITAL_COMMUNITY): Payer: Medicare Other

## 2013-08-03 ENCOUNTER — Encounter (HOSPITAL_COMMUNITY): Payer: Self-pay | Admitting: Emergency Medicine

## 2013-08-03 ENCOUNTER — Inpatient Hospital Stay (HOSPITAL_COMMUNITY)
Admission: EM | Admit: 2013-08-03 | Discharge: 2013-08-08 | DRG: 543 | Disposition: A | Discharge status: Skilled Nursing Facility | Payer: Medicare Other | Attending: Internal Medicine | Admitting: Internal Medicine

## 2013-08-03 DIAGNOSIS — K567 Ileus, unspecified: Secondary | ICD-10-CM

## 2013-08-03 DIAGNOSIS — K5900 Constipation, unspecified: Secondary | ICD-10-CM | POA: Diagnosis present

## 2013-08-03 DIAGNOSIS — S32009A Unspecified fracture of unspecified lumbar vertebra, initial encounter for closed fracture: Secondary | ICD-10-CM

## 2013-08-03 DIAGNOSIS — Z79899 Other long term (current) drug therapy: Secondary | ICD-10-CM

## 2013-08-03 DIAGNOSIS — F329 Major depressive disorder, single episode, unspecified: Secondary | ICD-10-CM | POA: Diagnosis present

## 2013-08-03 DIAGNOSIS — M4856XA Collapsed vertebra, not elsewhere classified, lumbar region, initial encounter for fracture: Secondary | ICD-10-CM

## 2013-08-03 DIAGNOSIS — E039 Hypothyroidism, unspecified: Secondary | ICD-10-CM | POA: Diagnosis present

## 2013-08-03 DIAGNOSIS — F411 Generalized anxiety disorder: Secondary | ICD-10-CM | POA: Diagnosis present

## 2013-08-03 DIAGNOSIS — I509 Heart failure, unspecified: Secondary | ICD-10-CM | POA: Diagnosis present

## 2013-08-03 DIAGNOSIS — IMO0002 Reserved for concepts with insufficient information to code with codable children: Secondary | ICD-10-CM

## 2013-08-03 DIAGNOSIS — K829 Disease of gallbladder, unspecified: Secondary | ICD-10-CM

## 2013-08-03 DIAGNOSIS — R197 Diarrhea, unspecified: Secondary | ICD-10-CM

## 2013-08-03 DIAGNOSIS — I10 Essential (primary) hypertension: Secondary | ICD-10-CM | POA: Diagnosis present

## 2013-08-03 DIAGNOSIS — M8448XA Pathological fracture, other site, initial encounter for fracture: Principal | ICD-10-CM | POA: Diagnosis present

## 2013-08-03 DIAGNOSIS — R109 Unspecified abdominal pain: Secondary | ICD-10-CM

## 2013-08-03 DIAGNOSIS — M810 Age-related osteoporosis without current pathological fracture: Secondary | ICD-10-CM | POA: Diagnosis present

## 2013-08-03 DIAGNOSIS — K219 Gastro-esophageal reflux disease without esophagitis: Secondary | ICD-10-CM | POA: Diagnosis present

## 2013-08-03 DIAGNOSIS — R269 Unspecified abnormalities of gait and mobility: Secondary | ICD-10-CM

## 2013-08-03 DIAGNOSIS — M1710 Unilateral primary osteoarthritis, unspecified knee: Secondary | ICD-10-CM | POA: Diagnosis present

## 2013-08-03 DIAGNOSIS — F3289 Other specified depressive episodes: Secondary | ICD-10-CM | POA: Diagnosis present

## 2013-08-03 DIAGNOSIS — I447 Left bundle-branch block, unspecified: Secondary | ICD-10-CM | POA: Diagnosis present

## 2013-08-03 DIAGNOSIS — R609 Edema, unspecified: Secondary | ICD-10-CM

## 2013-08-03 DIAGNOSIS — E876 Hypokalemia: Secondary | ICD-10-CM | POA: Diagnosis present

## 2013-08-03 DIAGNOSIS — I5022 Chronic systolic (congestive) heart failure: Secondary | ICD-10-CM | POA: Diagnosis present

## 2013-08-03 DIAGNOSIS — Z66 Do not resuscitate: Secondary | ICD-10-CM | POA: Diagnosis present

## 2013-08-03 DIAGNOSIS — R112 Nausea with vomiting, unspecified: Secondary | ICD-10-CM

## 2013-08-03 DIAGNOSIS — F1010 Alcohol abuse, uncomplicated: Secondary | ICD-10-CM

## 2013-08-03 DIAGNOSIS — F341 Dysthymic disorder: Secondary | ICD-10-CM

## 2013-08-03 DIAGNOSIS — K8689 Other specified diseases of pancreas: Secondary | ICD-10-CM | POA: Diagnosis present

## 2013-08-03 DIAGNOSIS — N390 Urinary tract infection, site not specified: Secondary | ICD-10-CM | POA: Diagnosis present

## 2013-08-03 LAB — CBC
HCT: 37.2 % (ref 36.0–46.0)
HEMOGLOBIN: 12.1 g/dL (ref 12.0–15.0)
MCH: 31.6 pg (ref 26.0–34.0)
MCHC: 32.5 g/dL (ref 30.0–36.0)
MCV: 97.1 fL (ref 78.0–100.0)
PLATELETS: 197 10*3/uL (ref 150–400)
RBC: 3.83 MIL/uL — ABNORMAL LOW (ref 3.87–5.11)
RDW: 15.6 % — AB (ref 11.5–15.5)
WBC: 6.9 10*3/uL (ref 4.0–10.5)

## 2013-08-03 LAB — URINE MICROSCOPIC-ADD ON

## 2013-08-03 LAB — URINALYSIS, ROUTINE W REFLEX MICROSCOPIC
Glucose, UA: NEGATIVE mg/dL
Ketones, ur: 15 mg/dL — AB
NITRITE: POSITIVE — AB
PROTEIN: 30 mg/dL — AB
Specific Gravity, Urine: 1.026 (ref 1.005–1.030)
UROBILINOGEN UA: 1 mg/dL (ref 0.0–1.0)
pH: 5.5 (ref 5.0–8.0)

## 2013-08-03 LAB — CREATININE, SERUM
CREATININE: 0.43 mg/dL — AB (ref 0.50–1.10)
GFR calc Af Amer: >90 mL/min (ref >90)
GFR, EST NON AFRICAN AMERICAN: 88 mL/min — AB (ref >90)

## 2013-08-03 LAB — CBC WITH DIFFERENTIAL/PLATELET
BASOS PCT: 0 % (ref 0–1)
Basophils Absolute: 0 10*3/uL (ref 0.0–0.1)
EOS PCT: 2 % (ref 0–5)
Eosinophils Absolute: 0.2 10*3/uL (ref 0.0–0.7)
HCT: 40.6 % (ref 36.0–46.0)
Hemoglobin: 13.2 g/dL (ref 12.0–15.0)
LYMPHS ABS: 0.8 10*3/uL (ref 0.7–4.0)
Lymphocytes Relative: 11 % — ABNORMAL LOW (ref 12–46)
MCH: 31.7 pg (ref 26.0–34.0)
MCHC: 32.5 g/dL (ref 30.0–36.0)
MCV: 97.4 fL (ref 78.0–100.0)
Monocytes Absolute: 0.8 10*3/uL (ref 0.1–1.0)
Monocytes Relative: 10 % (ref 3–12)
Neutro Abs: 5.7 10*3/uL (ref 1.7–7.7)
Neutrophils Relative %: 77 % (ref 43–77)
Platelets: 197 10*3/uL (ref 150–400)
RBC: 4.17 MIL/uL (ref 3.87–5.11)
RDW: 15.5 % (ref 11.5–15.5)
WBC: 7.5 10*3/uL (ref 4.0–10.5)

## 2013-08-03 LAB — BASIC METABOLIC PANEL
BUN: 12 mg/dL (ref 6–23)
CALCIUM: 9.2 mg/dL (ref 8.4–10.5)
CO2: 29 mEq/L (ref 19–32)
Chloride: 99 mEq/L (ref 96–112)
Creatinine, Ser: 0.5 mg/dL (ref 0.50–1.10)
GFR calc non Af Amer: 84 mL/min — ABNORMAL LOW (ref >90)
GLUCOSE: 116 mg/dL — AB (ref 70–99)
Potassium: 3.7 mEq/L (ref 3.7–5.3)
Sodium: 142 mEq/L (ref 137–147)

## 2013-08-03 LAB — TSH: TSH: 4.28 u[IU]/mL (ref 0.350–4.500)

## 2013-08-03 MED ORDER — HEPARIN SODIUM (PORCINE) 5000 UNIT/ML IJ SOLN
5000.0000 [IU] | Freq: Three times a day (TID) | INTRAMUSCULAR | Status: DC
  Administered 2013-08-03 – 2013-08-08 (×14): 5000 [IU] via SUBCUTANEOUS
  Filled 2013-08-03 (×18): qty 1

## 2013-08-03 MED ORDER — ONDANSETRON HCL 4 MG/2ML IJ SOLN
4.0000 mg | Freq: Four times a day (QID) | INTRAMUSCULAR | Status: DC | PRN
  Administered 2013-08-06: 4 mg via INTRAVENOUS
  Filled 2013-08-03: qty 2

## 2013-08-03 MED ORDER — TRAMADOL HCL 50 MG PO TABS
50.0000 mg | ORAL_TABLET | Freq: Four times a day (QID) | ORAL | Status: DC | PRN
  Administered 2013-08-04 – 2013-08-07 (×2): 50 mg via ORAL
  Filled 2013-08-03 (×2): qty 1

## 2013-08-03 MED ORDER — PANCRELIPASE (LIP-PROT-AMYL) 12000-38000 UNITS PO CPEP
2.0000 | ORAL_CAPSULE | Freq: Three times a day (TID) | ORAL | Status: DC
  Administered 2013-08-03 – 2013-08-08 (×11): 2 via ORAL
  Filled 2013-08-03 (×19): qty 2

## 2013-08-03 MED ORDER — BISACODYL 10 MG RE SUPP
10.0000 mg | Freq: Every day | RECTAL | Status: DC
  Administered 2013-08-03: 10 mg via RECTAL
  Filled 2013-08-03: qty 1

## 2013-08-03 MED ORDER — METOPROLOL TARTRATE 12.5 MG HALF TABLET
12.5000 mg | ORAL_TABLET | Freq: Two times a day (BID) | ORAL | Status: DC
  Administered 2013-08-03 – 2013-08-08 (×11): 12.5 mg via ORAL
  Filled 2013-08-03 (×12): qty 1

## 2013-08-03 MED ORDER — FUROSEMIDE 20 MG PO TABS
20.0000 mg | ORAL_TABLET | Freq: Two times a day (BID) | ORAL | Status: DC
  Administered 2013-08-03 – 2013-08-08 (×9): 20 mg via ORAL
  Filled 2013-08-03 (×13): qty 1

## 2013-08-03 MED ORDER — DEXTROSE 5 % IV SOLN
1.0000 g | Freq: Once | INTRAVENOUS | Status: AC
  Administered 2013-08-03: 1 g via INTRAVENOUS
  Filled 2013-08-03: qty 10

## 2013-08-03 MED ORDER — HYDROCODONE-ACETAMINOPHEN 5-325 MG PO TABS
1.0000 | ORAL_TABLET | ORAL | Status: DC | PRN
  Administered 2013-08-03: 2 via ORAL
  Administered 2013-08-04: 1 via ORAL
  Administered 2013-08-04 (×2): 2 via ORAL
  Administered 2013-08-04 – 2013-08-05 (×4): 1 via ORAL
  Filled 2013-08-03: qty 2
  Filled 2013-08-03 (×3): qty 1
  Filled 2013-08-03: qty 2
  Filled 2013-08-03 (×2): qty 1
  Filled 2013-08-03: qty 2

## 2013-08-03 MED ORDER — MORPHINE SULFATE 2 MG/ML IJ SOLN
2.0000 mg | Freq: Once | INTRAMUSCULAR | Status: AC
  Administered 2013-08-03: 2 mg via INTRAVENOUS
  Filled 2013-08-03: qty 1

## 2013-08-03 MED ORDER — CALCIUM CARBONATE-VITAMIN D 500-200 MG-UNIT PO TABS
2.0000 | ORAL_TABLET | Freq: Two times a day (BID) | ORAL | Status: DC
  Administered 2013-08-03 – 2013-08-08 (×11): 2 via ORAL
  Filled 2013-08-03 (×13): qty 2

## 2013-08-03 MED ORDER — POLYETHYLENE GLYCOL 3350 17 G PO PACK
17.0000 g | PACK | Freq: Two times a day (BID) | ORAL | Status: DC
  Administered 2013-08-03 – 2013-08-04 (×2): 17 g via ORAL
  Filled 2013-08-03 (×4): qty 1

## 2013-08-03 MED ORDER — POTASSIUM CHLORIDE CRYS ER 10 MEQ PO TBCR
10.0000 meq | EXTENDED_RELEASE_TABLET | Freq: Every day | ORAL | Status: DC
  Administered 2013-08-03 – 2013-08-08 (×6): 10 meq via ORAL
  Filled 2013-08-03 (×7): qty 1

## 2013-08-03 MED ORDER — DEXTROSE 5 % IV SOLN
1.0000 g | INTRAVENOUS | Status: AC
  Administered 2013-08-04 – 2013-08-06 (×3): 1 g via INTRAVENOUS
  Filled 2013-08-03 (×3): qty 10

## 2013-08-03 MED ORDER — GUAIFENESIN-DM 100-10 MG/5ML PO SYRP
5.0000 mL | ORAL_SOLUTION | ORAL | Status: DC | PRN
  Filled 2013-08-03: qty 5

## 2013-08-03 MED ORDER — LEVOTHYROXINE SODIUM 100 MCG PO TABS
100.0000 ug | ORAL_TABLET | Freq: Every day | ORAL | Status: DC
  Administered 2013-08-04 – 2013-08-08 (×5): 100 ug via ORAL
  Filled 2013-08-03 (×6): qty 1

## 2013-08-03 MED ORDER — DOCUSATE SODIUM 100 MG PO CAPS
200.0000 mg | ORAL_CAPSULE | Freq: Two times a day (BID) | ORAL | Status: DC
  Administered 2013-08-03 – 2013-08-04 (×3): 200 mg via ORAL
  Filled 2013-08-03 (×4): qty 2

## 2013-08-03 MED ORDER — ONDANSETRON HCL 4 MG PO TABS: 4.0000 mg | ORAL_TABLET | Freq: Four times a day (QID) | ORAL | Status: DC | PRN

## 2013-08-03 NOTE — ED Notes (Signed)
Pt returned from CT °

## 2013-08-03 NOTE — ED Notes (Signed)
Nephew returned from outside and updated on pt status. Informed MD planning on admitting the patient.

## 2013-08-03 NOTE — ED Notes (Signed)
Dr. Rancour at the bedside.  

## 2013-08-03 NOTE — H&P (Addendum)
Patient Demographics  Elizabeth [name], is a 80 y.o. female  MRN: [id number]   DOB - 1923-04-22  Admit Date - 08/03/2013  Outpatient Primary MD for the patient is Tamsen Roers, MD   With History of -  Past Medical History  Diagnosis Date  . Thyroid disease   . Hypertension   . Abnormality of gait 04/21/2008    Qualifier: Diagnosis of  By: Walker Kehr MD, Patrick Jupiter    . ALCOHOL ABUSE 04/21/2008    Qualifier: Diagnosis of  By: Walker Kehr MD, Patrick Jupiter    . ANEMIA, SECONDARY TO ACUTE BLOOD LOSS 04/21/2008    Qualifier: Diagnosis of  By: Walker Kehr MD, Patrick Jupiter    . DEPRESSION/ANXIETY 04/21/2008    Qualifier: Diagnosis of  By: Walker Kehr MD, Patrick Jupiter    . Edema 05/23/2008    Qualifier: Diagnosis of  By: Johnsie Cancel, MD, Rona Ravens   . LOSS OF APPETITE 04/21/2008    Qualifier: Diagnosis of  By: Walker Kehr MD, Patrick Jupiter    . UTI 04/21/2008    Qualifier: Diagnosis of  By: Walker Kehr MD, Patrick Jupiter    . CLOSED FRACTURE INTERTROCHANTERIC SECTION FEMUR 04/21/2008    Qualifier: Diagnosis of  By: Walker Kehr MD, Patrick Jupiter    . OSTEOARTHRITIS, KNEE, LEFT, MILD 04/21/2008    Qualifier: Diagnosis of  By: Walker Kehr MD, Patrick Jupiter    . OSTEOPOROSIS 04/21/2008    Qualifier: Diagnosis of  By: Walker Kehr MD, Patrick Jupiter    . HYPOTHYROIDISM 04/21/2008    Qualifier: Diagnosis of  By: Walker Kehr MD, Patrick Jupiter    . GALLBLADDER DISEASE 05/22/2008    Qualifier: Diagnosis of  By: Burnett Kanaris    . Acute CHF (congestive heart failure) 01/21/2013  . Essential hypertension, benign 04/21/2008    Qualifier: Diagnosis of  By: Walker Kehr MD, Patrick Jupiter    . LBBB 05/23/2008    Qualifier: Diagnosis of  By: Johnsie Cancel, MD, Rona Ravens       Past Surgical History  Procedure Laterality Date  . Cholecystectomy    . Femur im nail  09/09/2011    Procedure: INTRAMEDULLARY (IM) NAIL FEMORAL;  Surgeon: Mcarthur Rossetti, MD;  Location: [facility];  Service:  Orthopedics;  Laterality: Left;  distal interlocking screws    in for   Chief Complaint  Patient presents with  . Back Pain     HPI  Elizabeth [name]  is a 80 y.o. female, history of severe osteoporosis, hypertension, hypothyroidism, alcohol abuse in the past, Chronic systolic heart failure with EF 25%, left hip fracture status post open reduction internal fixation by Dr. Ninfa Linden and 2013, who lives by herself at home and ambulates with a walker presents to the ER with few day history of back pain, initially there was a question that patient had a fall however upon detailed interview patient says that she never incurred a fall in the last few months, this pain started spontaneously while she was ambulating and thinks she might have lifted something heavy causing back pain.  Her back pain is constant, dull, in the L-spine area, on going for the last few days, nonradiating, not related to any weakness-bowel or bladder incontinence, worse with movement better with rest.   She came to the ER with low back pain inability to get out of the bed or ambulate, in the ER workup consistent with L-spine compression fracture, UTI, severe stool burden and constipation, case was discussed with neurosurgeon Dr. Rita Ohara and I was requested to admit the patient.    Review of Systems    In addition to the HPI above,   No Fever-chills, No Headache, No changes with Vision or hearing, No problems swallowing food or Liquids, No Chest pain, Cough or Shortness of Breath, No Abdominal pain, No Nausea or Vommitting, Bowel movements are regular, No Blood in stool or Urine, No dysuria, No new skin rashes or bruises, No new joints pains-aches,  No new weakness, tingling, numbness in any extremity, except low back pain No recent weight gain or loss, No polyuria, polydypsia or polyphagia, No significant Mental Stressors.  A full 10 point Review of Systems was done, except as stated above, all other Review of  Systems were negative.   Social History History  Substance Use Topics  . Smoking status: Never Smoker   . Smokeless tobacco: Current User    Types: Snuff  . Alcohol Use: Yes     Comment: rare     Family History No history of multiple myeloma or spine fractures  Prior to Admission medications   Medication Sig Start Date End Date Taking? Authorizing Provider  CALCIUM PO Take 1 tablet by mouth 2 (two) times daily.   Yes Historical Provider, MD  furosemide (LASIX) 20 MG tablet Take 20 mg by mouth 2 (two) times daily.    Yes Historical Provider, MD  levothyroxine (SYNTHROID, LEVOTHROID) 100 MCG tablet Take 100 mcg by mouth daily before breakfast.   Yes Historical Provider, MD  lipase/protease/amylase (CREON-12/PANCREASE) 12000 UNITS CPEP capsule Take 2 capsules by mouth 3 (three) times daily before meals. And take 12000 units with snacks 03/17/13  Yes Gerlene Fee, NP  metoprolol tartrate (LOPRESSOR) 12.5 mg TABS tablet Take 0.5 tablets (12.5 mg total) by mouth 2 (two) times daily. 01/25/13  Yes Kinnie Feil, MD  potassium chloride (K-DUR,KLOR-CON) 10 MEQ tablet Take 10 mEq by mouth daily.   Yes Historical Provider, MD  promethazine (PHENERGAN) 25 MG tablet Take 25 mg by mouth every 6 (six) hours as needed for nausea or vomiting.   Yes Historical Provider, MD  traMADol (ULTRAM) 50 MG tablet Take 50 mg by mouth every 6 (six) hours as needed for moderate pain. 08/01/13  Yes Mirna Mires, MD  Vitamin D, Ergocalciferol, (DRISDOL) 50000 UNITS CAPS capsule Take 50,000 Units by mouth every 7 (seven) days.   Yes Historical Provider, MD    No Known Allergies  Physical Exam  Vitals  Blood pressure 140/65, pulse 72, temperature 97.6 F (36.4 C), temperature source Oral, resp. rate 13, SpO2 92.00%.   1. General frail elderly white female lying in bed in NAD,     2. Normal affect and insight, Not Suicidal or Homicidal, Awake Alert, Oriented X 3. Is hard of hearing  3. No F.N  deficits, ALL C.Nerves Intact, Strength 5/5 all 4 extremities, Sensation intact all 4 extremities, Plantars down going.  4. Ears and Eyes appear Normal, Conjunctivae clear, PERRLA. Moist Oral Mucosa.  5. Supple Neck, No JVD, No cervical lymphadenopathy appriciated, No Carotid Bruits.  6. Symmetrical Chest wall movement, Good air movement bilaterally, CTAB.  7. RRR, No Gallops, Rubs or Murmurs, No Parasternal Heave.  8. Positive Bowel Sounds, Abdomen Soft, No tenderness, No organomegaly appriciated,No rebound -guarding or rigidity.  9.  No Cyanosis, Normal Skin Turgor, No Skin Rash or Bruise.  10. Good muscle tone,  joints appear normal , no effusions, Normal ROM. Lower L-spine tenderness, no step deformity  11. No Palpable Lymph Nodes in Neck or Axillae     Data Review  CBC  Recent Labs Lab 08/03/13 0810  WBC 7.5  HGB 13.2  HCT 40.6  PLT 197  MCV 97.4  MCH 31.7  MCHC 32.5  RDW 15.5  LYMPHSABS 0.8  MONOABS 0.8  EOSABS 0.2  BASOSABS 0.0   ------------------------------------------------------------------------------------------------------------------  Chemistries   Recent Labs Lab 08/03/13 0810  NA 142  K 3.7  CL 99  CO2 29  GLUCOSE 116*  BUN 12  CREATININE 0.50  CALCIUM 9.2   ------------------------------------------------------------------------------------------------------------------ CrCl is unknown because both a height and weight (above a minimum accepted value) are required for this calculation. ------------------------------------------------------------------------------------------------------------------ No results found for this basename: TSH, T4TOTAL, FREET3, T3FREE, THYROIDAB,  in the last 72 hours   Coagulation profile No results found for this basename: INR, PROTIME,  in the last 168 hours ------------------------------------------------------------------------------------------------------------------- No results found for this  basename: DDIMER,  in the last 72 hours -------------------------------------------------------------------------------------------------------------------  Cardiac Enzymes No results found for this basename: CK, CKMB, TROPONINI, MYOGLOBIN,  in the last 168 hours ------------------------------------------------------------------------------------------------------------------ No components found with this basename: POCBNP,    ---------------------------------------------------------------------------------------------------------------  Urinalysis    Component Value Date/Time   COLORURINE AMBER* 08/03/2013 0814   APPEARANCEUR HAZY* 08/03/2013 0814   LABSPEC 1.026 08/03/2013 [place] 5.5 08/03/2013 [place] 08/03/2013 0814   HGBUR MODERATE* 08/03/2013 0814   BILIRUBINUR SMALL* 08/03/2013 0814   KETONESUR 15* 08/03/2013 0814   PROTEINUR 30* 08/03/2013 0814   UROBILINOGEN 1.0 08/03/2013 0814   NITRITE POSITIVE* 08/03/2013 0814   LEUKOCYTESUR SMALL* 08/03/2013 0814    ----------------------------------------------------------------------------------------------------------------  Imaging results:   Ct Abdomen Pelvis Wo Contrast  08/03/2013   CLINICAL DATA:  Right-sided abdominal and flank pain.  EXAM: CT ABDOMEN AND PELVIS WITHOUT CONTRAST  TECHNIQUE: Multidetector CT imaging of the abdomen and pelvis was performed following the standard protocol without IV contrast.  COMPARISON:  Pelvis CT on 11/02/2006  FINDINGS: Images through the lung bases show small bilateral pleural effusions and bibasilar atelectasis. A moderate size hiatal hernia is seen containing both stomach and transverse colon. Cardiomegaly and diffuse coronary artery calcification noted.  Noncontrast images of the liver, pancreas, spleen, and adrenal glands are unremarkable in appearance. Small nonobstructive left lower pole renal calculi are seen as well as a small fluid attenuation renal cyst. A 1.9 cm subcapsular lesion is  also seen in the posterior lower pole of the left kidney on image 36, which has higher than fluid attenuation but remains stable compared with previous pelvis CT in 2008. This is most consistent with a benign hemorrhagic or proteinaceous renal cyst. There is no evidence of hydronephrosis or ureteral calculi.  Calcified uterine fibroids are noted, largest measuring 5.5 cm. No definite lymphadenopathy identified within the abdomen or pelvis. Surgical clips seen from prior cholecystectomy. No evidence of acute inflammatory process or abnormal fluid collections. Large colonic stool burden noted, however there is no evidence of bowel obstruction.  Osteopenia and multiple lower thoracic and lumbar vertebral body compression fracture deformities are noted.  No suspicious bone lesions identified. Fixation hardware noted in left hip.  IMPRESSION: Tiny nonobstructive left renal calculi. No evidence of ureteral calculi or hydronephrosis.  Calcified uterine fibroids, largest measuring 5.5 cm.  Large stool burden noted; suggest clinical correlation for possible constipation.  Moderate size hiatal hernia containing stomach and transverse colon.  Multiple osteopenic lower thoracic and lumbar vertebral body compression fractures.  Small bilateral pleural effusions and bibasilar atelectasis.  Cardiomegaly and diffuse coronary artery calcification.   Electronically Signed   By: Earle Gell M.D.   On: 08/03/2013 09:23   Dg Lumbar Spine Complete  08/01/2013   CLINICAL DATA:  Low back pain.  No known injury.  EXAM: LUMBAR SPINE - COMPLETE 4+ VIEW  COMPARISON:  None.  FINDINGS: There is a L4 compression fracture of uncertain age. There is also mild central depression of the L2 vertebral body. No destructive bony changes. The alignment is maintained. Moderate osteoporosis. Moderate multilevel facet disease but no definite pars defects. There are aortoiliac vascular calcifications but no definite aneurysm. Calcified uterine fibroids are  noted.  IMPRESSION: L2 and L4 compression deformities of uncertain age.   Electronically Signed   By: Kalman Jewels M.D.   On: 08/01/2013 08:27         Assessment & Plan   1. Low back pain due to L-spine compression fracture. Case discussed by me with neurosurgeon Dr.Nudlemann who recommends an Aspen back brace which has been ordered, 48 hours of bedrest, thereafter PT evaluation and placement. Fracture most likely due to severe osteoporosis.    2. Severe osteoporosis - placed calcium vitamin D. Outpatient monitor by PCP.    3. UTI. Follow urine cultures placed on IV Rocephin.    4. Massive stool burden with constipation. Initiated bowel regimen.    5. Hypothyroidism. Continue home dose Synthroid check baseline TSH.    6. History of pancreatic insufficiency. Continue pancreatic enzymes orally.    7. Chronic systolic CHF with EF 96% -  continue home dose beta blocker along with Lasix. Monitor clinically closely      DVT Prophylaxis Heparin    AM Labs Ordered, also please review Full Orders  Family Communication: Admission, patients condition and plan of care including tests being ordered have been discussed with the patient  who indicates understanding and agree with the plan and Code Status.  Code Status DNR  Likely DC to  SNF  Condition Fair  Time spent in minutes : 35    SINGH,PRASHANT K M.D on 08/03/2013 at 11:05 AM  Between 7am to 7pm - Pager - [phone]  After 7pm go to [email or web address] - password [email or web address]  And look for the night coverage person covering me after hours  [facility] Office  [phone]   **Disclaimer: This note may have been dictated with voice recognition software. Similar sounding words can inadvertently be transcribed and this note may contain transcription errors which may not have been corrected upon publication of note.**

## 2013-08-03 NOTE — ED Notes (Signed)
Attempted to ambulate pt with assistance from the ED tech. Pt was unable to try to sit up in bed d/t pain. Nephew had to step out.

## 2013-08-03 NOTE — ED Notes (Signed)
Pt requesting more pain medication. Informed the MD wants her to try to walk. Pt states "I can't". MD made aware.

## 2013-08-03 NOTE — ED Provider Notes (Signed)
CSN: [id number]     Arrival date & time 08/03/13  0714 History   First MD Initiated Contact with Patient 08/03/13 [phone]     Chief Complaint  Patient presents with  . Back Pain     (Consider location/radiation/quality/duration/timing/severity/associated sxs/prior Treatment) HPI Comments: 80 year old female with low back pain for the past 4 days. She denies any falls. She states she was lifting her walker on a carpeted surface and felt pain in her back. It is at her waistline, worse with bending and movement. She was seen in ED 2 days ago and diagnosed with compression fractures of the lumbar spine. She was sent home on tramadol and home health services. She states the medication is not working she is unable to walk. She denies any new falls or trauma. Denies any chest pain or shortness of breath. No focal weakness, CC.Marland Kitchen No fevers and vomiting. No history of AAA.  The history is provided by the patient and the EMS personnel.    Past Medical History  Diagnosis Date  . Thyroid disease   . Hypertension   . Abnormality of gait 04/21/2008    Qualifier: Diagnosis of  By: Sheffield Slider MD, Deniece Portela    . ALCOHOL ABUSE 04/21/2008    Qualifier: Diagnosis of  By: Sheffield Slider MD, Deniece Portela    . ANEMIA, SECONDARY TO ACUTE BLOOD LOSS 04/21/2008    Qualifier: Diagnosis of  By: Sheffield Slider MD, Deniece Portela    . DEPRESSION/ANXIETY 04/21/2008    Qualifier: Diagnosis of  By: Sheffield Slider MD, Deniece Portela    . Edema 05/23/2008    Qualifier: Diagnosis of  By: Eden Emms, MD, Harrington Challenger   . LOSS OF APPETITE 04/21/2008    Qualifier: Diagnosis of  By: Sheffield Slider MD, Deniece Portela    . UTI 04/21/2008    Qualifier: Diagnosis of  By: Sheffield Slider MD, Deniece Portela    . CLOSED FRACTURE INTERTROCHANTERIC SECTION FEMUR 04/21/2008    Qualifier: Diagnosis of  By: Sheffield Slider MD, Deniece Portela    . OSTEOARTHRITIS, KNEE, LEFT, MILD 04/21/2008    Qualifier: Diagnosis of  By: Sheffield Slider MD, Deniece Portela    . OSTEOPOROSIS 04/21/2008    Qualifier: Diagnosis of  By: Sheffield Slider MD, Deniece Portela    . HYPOTHYROIDISM 04/21/2008    Qualifier:  Diagnosis of  By: Sheffield Slider MD, Deniece Portela    . GALLBLADDER DISEASE 05/22/2008    Qualifier: Diagnosis of  By: Kem Parkinson    . Acute CHF (congestive heart failure) 01/21/2013  . Essential hypertension, benign 04/21/2008    Qualifier: Diagnosis of  By: Sheffield Slider MD, Deniece Portela    . LBBB 05/23/2008    Qualifier: Diagnosis of  By: Eden Emms, MD, Harrington Challenger    Past Surgical History  Procedure Laterality Date  . Cholecystectomy    . Femur im nail  09/09/2011    Procedure: INTRAMEDULLARY (IM) NAIL FEMORAL;  Surgeon: Kathryne Hitch, MD;  Location: [facility] OR;  Service: Orthopedics;  Laterality: Left;  distal interlocking screws   No family history on file. History  Substance Use Topics  . Smoking status: Never Smoker   . Smokeless tobacco: Current User    Types: Snuff  . Alcohol Use: Yes     Comment: rare   OB History   Grav Para Term Preterm Abortions TAB SAB Ect Mult Living                 Review of Systems  Constitutional: Positive for activity change, appetite change and fatigue. Negative for fever.  Respiratory: Negative for cough, chest tightness and  shortness of breath.   Cardiovascular: Negative for chest pain.  Gastrointestinal: Negative for nausea, vomiting and abdominal pain.  Genitourinary: Negative for dysuria and hematuria.  Musculoskeletal: Positive for arthralgias, back pain, gait problem and myalgias.  Skin: Negative for rash.  Neurological: Positive for weakness. Negative for dizziness and headaches.  A complete 10 system review of systems was obtained and all systems are negative except as noted in the HPI and PMH.      Allergies  Review of patient's allergies indicates no known allergies.  Home Medications   Prior to Admission medications   Medication Sig Start Date End Date Taking? Authorizing Provider  CALCIUM PO Take 1 tablet by mouth 2 (two) times daily.   Yes Historical Provider, MD  furosemide (LASIX) 20 MG tablet Take 20 mg by mouth 2 (two) times daily.     Yes Historical Provider, MD  levothyroxine (SYNTHROID, LEVOTHROID) 100 MCG tablet Take 100 mcg by mouth daily before breakfast.   Yes Historical Provider, MD  lipase/protease/amylase (CREON-12/PANCREASE) 12000 UNITS CPEP capsule Take 2 capsules by mouth 3 (three) times daily before meals. And take [id number] units with snacks 03/17/13  Yes Sharee Holstereborah S Green, NP  metoprolol tartrate (LOPRESSOR) 12.5 mg TABS tablet Take 0.5 tablets (12.5 mg total) by mouth 2 (two) times daily. 01/25/13  Yes Esperanza SheetsUlugbek N Buriev, MD  potassium chloride (K-DUR,KLOR-CON) 10 MEQ tablet Take 10 mEq by mouth daily.   Yes Historical Provider, MD  promethazine (PHENERGAN) 25 MG tablet Take 25 mg by mouth every 6 (six) hours as needed for nausea or vomiting.   Yes Historical Provider, MD  traMADol (ULTRAM) 50 MG tablet Take 50 mg by mouth every 6 (six) hours as needed for moderate pain. 08/01/13  Yes Suzi RootsKevin E Steinl, MD  Vitamin D, Ergocalciferol, (DRISDOL) 50000 UNITS CAPS capsule Take 50,000 Units by mouth every 7 (seven) days.   Yes Historical Provider, MD   BP 125/88  Pulse 81  Temp(Src) 97.5 F (36.4 C) (Oral)  Resp 15  SpO2 99% Physical Exam  Nursing note and vitals reviewed. Constitutional: She is oriented to person, place, and time. She appears well-developed and well-nourished. No distress.  HENT:  Head: Normocephalic and atraumatic.  Mouth/Throat: Oropharynx is clear and moist. No oropharyngeal exudate.  Eyes: Conjunctivae and EOM are normal. Pupils are equal, round, and reactive to light.  Neck: Normal range of motion. Neck supple.  No meningismus.  Cardiovascular: Normal rate, regular rhythm, normal heart sounds and intact distal pulses.   No murmur heard. Pulmonary/Chest: Effort normal and breath sounds normal. No respiratory distress.  Abdominal: Soft. There is no tenderness. There is no rebound and no guarding.  Musculoskeletal: Normal range of motion. She exhibits tenderness. She exhibits no edema.  Tenderness  the upper lumbar spine, no step-offs. No rash. 5/5 strength in bilateral lower extremities. Ankle plantar and dorsiflexion intact. Great toe extension intact bilaterally. +2 DP and PT pulses. +2 patellar reflexes bilaterally.    Neurological: She is alert and oriented to person, place, and time. No cranial nerve deficit. She exhibits normal muscle tone. Coordination normal.  No ataxia on finger to nose bilaterally. No pronator drift. 5/5 strength throughout. CN 2-12 intact.   Skin: Skin is warm.  Psychiatric: She has a normal mood and affect. Her behavior is normal.    ED Course  Procedures (including critical care time) Labs Review Labs Reviewed  CBC WITH DIFFERENTIAL - Abnormal; Notable for the following:    Lymphocytes Relative 11 (*)  All other components within normal limits  BASIC METABOLIC PANEL - Abnormal; Notable for the following:    Glucose, Bld 116 (*)    GFR calc non Af Amer 84 (*)    All other components within normal limits  URINALYSIS, ROUTINE W REFLEX MICROSCOPIC - Abnormal; Notable for the following:    Color, Urine AMBER (*)    APPearance HAZY (*)    Hgb urine dipstick MODERATE (*)    Bilirubin Urine SMALL (*)    Ketones, ur 15 (*)    Protein, ur 30 (*)    Nitrite POSITIVE (*)    Leukocytes, UA SMALL (*)    All other components within normal limits  URINE MICROSCOPIC-ADD ON - Abnormal; Notable for the following:    Bacteria, UA MANY (*)    Crystals CA OXALATE CRYSTALS (*)    All other components within normal limits  CBC - Abnormal; Notable for the following:    RBC 3.83 (*)    RDW 15.6 (*)    All other components within normal limits  CREATININE, SERUM - Abnormal; Notable for the following:    Creatinine, Ser 0.43 (*)    GFR calc non Af Amer 88 (*)    All other components within normal limits  TSH  BASIC METABOLIC PANEL  CBC    Imaging Review Ct Abdomen Pelvis Wo Contrast  08/03/2013   CLINICAL DATA:  Right-sided abdominal and flank pain.  EXAM:  CT ABDOMEN AND PELVIS WITHOUT CONTRAST  TECHNIQUE: Multidetector CT imaging of the abdomen and pelvis was performed following the standard protocol without IV contrast.  COMPARISON:  Pelvis CT on 11/02/2006  FINDINGS: Images through the lung bases show small bilateral pleural effusions and bibasilar atelectasis. A moderate size hiatal hernia is seen containing both stomach and transverse colon. Cardiomegaly and diffuse coronary artery calcification noted.  Noncontrast images of the liver, pancreas, spleen, and adrenal glands are unremarkable in appearance. Small nonobstructive left lower pole renal calculi are seen as well as a small fluid attenuation renal cyst. A 1.9 cm subcapsular lesion is also seen in the posterior lower pole of the left kidney on image 36, which has higher than fluid attenuation but remains stable compared with previous pelvis CT in 2008. This is most consistent with a benign hemorrhagic or proteinaceous renal cyst. There is no evidence of hydronephrosis or ureteral calculi.  Calcified uterine fibroids are noted, largest measuring 5.5 cm. No definite lymphadenopathy identified within the abdomen or pelvis. Surgical clips seen from prior cholecystectomy. No evidence of acute inflammatory process or abnormal fluid collections. Large colonic stool burden noted, however there is no evidence of bowel obstruction.  Osteopenia and multiple lower thoracic and lumbar vertebral body compression fracture deformities are noted. No suspicious bone lesions identified. Fixation hardware noted in left hip.  IMPRESSION: Tiny nonobstructive left renal calculi. No evidence of ureteral calculi or hydronephrosis.  Calcified uterine fibroids, largest measuring 5.5 cm.  Large stool burden noted; suggest clinical correlation for possible constipation.  Moderate size hiatal hernia containing stomach and transverse colon.  Multiple osteopenic lower thoracic and lumbar vertebral body compression fractures.  Small  bilateral pleural effusions and bibasilar atelectasis.  Cardiomegaly and diffuse coronary artery calcification.   Electronically Signed   By: Myles Rosenthal M.D.   On: 08/03/2013 09:23     EKG Interpretation None      MDM   Final diagnoses:  Compression fracture of lumbar vertebra, non-traumatic, initial encounter   patient presents with worsening back pain  after being diagnosed with lumbar compression fractures 2 days ago. No falls. No focal weakness, numbness or tingling.  UA is positive for UTI. CT scan shows large stool burden as well as multiple compression fractures.  Patient's pain is not controlled with morphine and she is unable to walk. Discussed with Dr. Thedore MinsSingh who will admit for observation. He requests neurosurgical consult from Dr. Newell CoralNudelman. This was done by phone, Dr. Newell CoralNudelman recommends bed rest, pain control, Aspen back brace.     Glynn OctaveStephen Sharalee Witman, MD 08/03/13 [phone]

## 2013-08-03 NOTE — ED Notes (Addendum)
Pt was seen here for a fall and back pain on 29th. Given tramadol and the pain is no better. Dx with compression fx. Per [facility], no loc or new fall. Pt is from home. Nephew states she has been having hallucinations at night after taking the tramadol and hollering at night.

## 2013-08-04 ENCOUNTER — Encounter (HOSPITAL_COMMUNITY): Payer: Self-pay | Admitting: General Practice

## 2013-08-04 LAB — CBC
HCT: 34.2 % — ABNORMAL LOW (ref 36.0–46.0)
Hemoglobin: 11 g/dL — ABNORMAL LOW (ref 12.0–15.0)
MCH: 31.3 pg (ref 26.0–34.0)
MCHC: 32.2 g/dL (ref 30.0–36.0)
MCV: 97.4 fL (ref 78.0–100.0)
PLATELETS: 183 10*3/uL (ref 150–400)
RBC: 3.51 MIL/uL — ABNORMAL LOW (ref 3.87–5.11)
RDW: 15.7 % — AB (ref 11.5–15.5)
WBC: 8.3 10*3/uL (ref 4.0–10.5)

## 2013-08-04 LAB — BASIC METABOLIC PANEL
Anion gap: 11 (ref 5–15)
BUN: 13 mg/dL (ref 6–23)
CALCIUM: 9 mg/dL (ref 8.4–10.5)
CO2: 30 mEq/L (ref 19–32)
CREATININE: 0.43 mg/dL — AB (ref 0.50–1.10)
Chloride: 99 mEq/L (ref 96–112)
GFR calc Af Amer: >90 mL/min (ref >90)
GFR, EST NON AFRICAN AMERICAN: 88 mL/min — AB (ref >90)
Glucose, Bld: 131 mg/dL — ABNORMAL HIGH (ref 70–99)
Potassium: 3.3 mEq/L — ABNORMAL LOW (ref 3.7–5.3)
Sodium: 140 mEq/L (ref 137–147)

## 2013-08-04 MED ORDER — POLYETHYLENE GLYCOL 3350 17 G PO PACK: 17.0000 g | PACK | Freq: Every day | ORAL | Status: DC | PRN

## 2013-08-04 MED ORDER — FENTANYL 12 MCG/HR TD PT72
12.5000 ug | MEDICATED_PATCH | TRANSDERMAL | Status: DC
  Administered 2013-08-04 – 2013-08-07 (×2): 12.5 ug via TRANSDERMAL
  Filled 2013-08-04 (×2): qty 1

## 2013-08-04 NOTE — Progress Notes (Signed)
Oob to chair with back brace on tol  Fair sat up about an hour

## 2013-08-04 NOTE — Clinical Social Work Placement (Addendum)
Clinical Social Work Department CLINICAL SOCIAL WORK PLACEMENT NOTE 08/04/2013  Patient:  Elizabeth [name],Elizabeth [name]  Account Number:  [id number] Admit date:  07/27/2013  Clinical Social Worker:  Robin SearingJANET CALDWELL, LCSWA  Date/time:  08/04/2013 03:27 PM  Clinical Social Work is seeking post-discharge placement for this patient at the following level of care:   SKILLED NURSING   (*CSW will update this form in Epic as items are completed)   08/04/2013  [facility]family provided with [facility] Work's list of facilities offering this level of care within the geographic area requested by the patient (or if unable, by the patient's family).  08/04/2013  Patient/family informed of their freedom to choose among providers that offer the needed level of care, that participate in Medicare, Medicaid or managed care program needed by the patient, have an available bed and are willing to accept the patient.  08/04/2013  Patient/family informed of [facility]' ownership interest in [facility], as well as of the fact that they are under no obligation to receive care at this facility.  PASARR submitted to EDS on 08/04/2013 PASARR number received on 08/04/2013  FL2 transmitted to all facilities in geographic area requested by pt/family on  08/04/2013 FL2 transmitted to all facilities within larger geographic area on   Patient informed that his/her managed care company has contracts with or will negotiate with  certain facilities, including the following:     Patient/family informed of bed offers received:  08[facility]07/13 [facility] recommends and patient [facility] bed [facility]    [facility]  On 08/08/13   Patient to be transferred to facility by [facility] Patient and family notified of transfer on  Name of family member notified:  Patient reports she does not have very many family members. Pt is alert and oriented and sister is a  resident at another SNF.   The following physician request were entered in Epic:   Additional Comments: Reece LevyJanet Caldwell, MSW, Theresia MajorsLCSWA [phone]

## 2013-08-04 NOTE — Clinical Social Work Psychosocial (Signed)
Clinical Social Work Department BRIEF PSYCHOSOCIAL ASSESSMENT 08/04/2013  Patient:  Elizabeth [name], Elizabeth [name]     Account Number:  [id number]     Admit date:  07/27/2013  Clinical Social Worker:  Daiva Huge  Date/Time:  08/04/2013 03:12 PM  Referred by:  Physician  Date Referred:  08/04/2013 Referred for  SNF Placement   Other Referral:   Interview type:  Patient Other interview type:    PSYCHOSOCIAL DATA Living Status:  FAMILY Admitted from facility:   Level of care:   Primary support name:  nephew- [name] Primary support relationship to patient:  FAMILY Degree of support available:   good    CURRENT CONCERNS Current Concerns  Post-Acute Placement   Other Concerns:    SOCIAL WORK ASSESSMENT / PLAN Met with patient who reports she has a nephew who lives with her but he works- SHe is open to going to SNF at d/c for rehab- she reports she has been to SNF in the past and is open to this again- she prefers to go to [facility] where she has been before and where her sister resides. She is optimistic and eager to rehab and get back home.   Assessment/plan status:  Other - See comment Other assessment/ plan:   FL2 and PASARR for SNF search   Information/referral to community resources:   SNF list    PATIENT'S/FAMILY'S RESPONSE TO PLAN OF CARE: SNF search underway- will advise as offers are recd as well as  insurance Karna Dupes, MSW, [place]

## 2013-08-04 NOTE — Progress Notes (Signed)
Patient Demographics  Elizabeth [name], is a 80 y.o. female, DOB - [birth year], [id number]  Admit date - 08/03/2013   Admitting Physician Leroy Sea, MD  Outpatient Primary MD for the patient is Aida Puffer, MD  LOS - 1   Chief Complaint  Patient presents with  . Back Pain       Subjective:    Elizabeth [name] today has, No headache, No chest pain, No abdominal pain - No Nausea, No new weakness tingling or numbness, No Cough - SOB. +ve Low Back Pain.    Assessment & Plan    1. Low back pain due to L-spine compression fracture. Case discussed by me with neurosurgeon Dr.Nudlemann who recommends an Aspen back brace which has been ordered, 48 hours of bedrest, thereafter PT evaluation and placement. Fracture most likely due to severe osteoporosis.      2. Severe osteoporosis - placed calcium vitamin D. Then Outpatient monitor by PCP.      3. UTI. Follow urine cultures placed on IV Rocephin.      4. Massive stool burden with constipation. Initiated bowel regimen.      5. Hypothyroidism. Continue home dose Synthroid.   Lab Results  Component Value Date   TSH 4.280 08/03/2013        6. History of pancreatic insufficiency. Continue pancreatic enzymes orally.      7. Chronic systolic CHF with EF 25% - continue home dose beta blocker along with Lasix. Monitor clinically closely.     8.Low K - replace.       Code Status: DNR  Family Communication:  None present  Disposition Plan: TBD   Procedures CT Abdomen Pelvis Wo Contrast   Consults N Surg - Dr Jule Ser over the Phone   Medications  Scheduled Meds: . bisacodyl  10 mg Rectal Daily  . calcium-vitamin D  2 tablet Oral BID  . cefTRIAXone (ROCEPHIN)  IV  1 g Intravenous Q24H  . docusate sodium   200 mg Oral BID  . fentaNYL  12.5 mcg Transdermal Q72H  . furosemide  20 mg Oral BID  . heparin  5,000 Units Subcutaneous 3 times per day  . levothyroxine  100 mcg Oral QAC breakfast  . lipase/protease/amylase  2 capsule Oral TID AC  . metoprolol tartrate  12.5 mg Oral BID  . polyethylene glycol  17 g Oral BID  . potassium chloride  10 mEq Oral Daily   Continuous Infusions:  PRN Meds:.guaiFENesin-dextromethorphan, HYDROcodone-acetaminophen, ondansetron (ZOFRAN) IV, ondansetron, traMADol  DVT Prophylaxis   Heparin   Lab Results  Component Value Date   PLT 183 08/04/2013    Antibiotics   Anti-infectives   Start     Dose/Rate Route Frequency Ordered Stop   08/04/13 0800  cefTRIAXone (ROCEPHIN) 1 g in dextrose 5 % 50 mL IVPB     1 g 100 mL/hr over 30 Minutes Intravenous Every 24 hours 08/03/13 0953     08/03/13 0930  cefTRIAXone (ROCEPHIN) 1 g in dextrose 5 % 50 mL IVPB     1 g 100 mL/hr over 30 Minutes Intravenous  Once 08/03/13 0927 08/03/13 1010          Objective:   Filed Vitals:   08/03/13 1635 08/03/13 2100 08/04/13 0500  08/04/13 0629  BP: 125/88 124/85  129/56  Pulse:  79  56  Temp:  97.6 F (36.4 C)  97.5 F (36.4 C)  TempSrc:      Resp:  17  15  Weight:   53.6 kg (118 lb 2.7 oz)   SpO2:  98%  100%    Wt Readings from Last 3 Encounters:  08/04/13 53.6 kg (118 lb 2.7 oz)  03/24/13 51.075 kg (112 lb 9.6 oz)  03/17/13 51.075 kg (112 lb 9.6 oz)     Intake/Output Summary (Last 24 hours) at 08/04/13 0951 Last data filed at 08/04/13 0631  Gross per 24 hour  Intake    480 ml  Output      4 ml  Net    476 ml     Physical Exam  Awake Alert, Oriented X 3, No new F.N deficits, Normal affect [place].AT,PERRAL Supple Neck,No JVD, No cervical lymphadenopathy appriciated.  Symmetrical Chest wall movement, Good air movement bilaterally, CTAB RRR,No Gallops,Rubs or new Murmurs, No Parasternal Heave +ve B.Sounds, Abd Soft, No tenderness, No organomegaly  appriciated, No rebound - guarding or rigidity. No Cyanosis, Clubbing or edema, No new Rash or bruise     Data Review   Micro Results No results found for this or any previous visit (from the past 240 hour(s)).  Radiology Reports Ct Abdomen Pelvis Wo Contrast  08/03/2013   CLINICAL DATA:  Right-sided abdominal and flank pain.  EXAM: CT ABDOMEN AND PELVIS WITHOUT CONTRAST  TECHNIQUE: Multidetector CT imaging of the abdomen and pelvis was performed following the standard protocol without IV contrast.  COMPARISON:  Pelvis CT on 11/02/2006  FINDINGS: Images through the lung bases show small bilateral pleural effusions and bibasilar atelectasis. A moderate size hiatal hernia is seen containing both stomach and transverse colon. Cardiomegaly and diffuse coronary artery calcification noted.  Noncontrast images of the liver, pancreas, spleen, and adrenal glands are unremarkable in appearance. Small nonobstructive left lower pole renal calculi are seen as well as a small fluid attenuation renal cyst. A 1.9 cm subcapsular lesion is also seen in the posterior lower pole of the left kidney on image 36, which has higher than fluid attenuation but remains stable compared with previous pelvis CT in 2008. This is most consistent with a benign hemorrhagic or proteinaceous renal cyst. There is no evidence of hydronephrosis or ureteral calculi.  Calcified uterine fibroids are noted, largest measuring 5.5 cm. No definite lymphadenopathy identified within the abdomen or pelvis. Surgical clips seen from prior cholecystectomy. No evidence of acute inflammatory process or abnormal fluid collections. Large colonic stool burden noted, however there is no evidence of bowel obstruction.  Osteopenia and multiple lower thoracic and lumbar vertebral body compression fracture deformities are noted. No suspicious bone lesions identified. Fixation hardware noted in left hip.  IMPRESSION: Tiny nonobstructive left renal calculi. No evidence  of ureteral calculi or hydronephrosis.  Calcified uterine fibroids, largest measuring 5.5 cm.  Large stool burden noted; suggest clinical correlation for possible constipation.  Moderate size hiatal hernia containing stomach and transverse colon.  Multiple osteopenic lower thoracic and lumbar vertebral body compression fractures.  Small bilateral pleural effusions and bibasilar atelectasis.  Cardiomegaly and diffuse coronary artery calcification.   Electronically Signed   By: Myles RosenthalJohn  Stahl M.D.   On: 08/03/2013 09:23   Dg Lumbar Spine Complete  08/01/2013   CLINICAL DATA:  Low back pain.  No known injury.  EXAM: LUMBAR SPINE - COMPLETE 4+ VIEW  COMPARISON:  None.  FINDINGS: There is a L4 compression fracture of uncertain age. There is also mild central depression of the L2 vertebral body. No destructive bony changes. The alignment is maintained. Moderate osteoporosis. Moderate multilevel facet disease but no definite pars defects. There are aortoiliac vascular calcifications but no definite aneurysm. Calcified uterine fibroids are noted.  IMPRESSION: L2 and L4 compression deformities of uncertain age.   Electronically Signed   By: Loralie ChampagneMark  Gallerani M.D.   On: 08/01/2013 08:27    CBC  Recent Labs Lab 08/03/13 0810 08/03/13 1520 08/04/13 0400  WBC 7.5 6.9 8.3  HGB 13.2 12.1 11.0*  HCT 40.6 37.2 34.2*  PLT 197 197 183  MCV 97.4 97.1 97.4  MCH 31.7 31.6 31.3  MCHC 32.5 32.5 32.2  RDW 15.5 15.6* 15.7*  LYMPHSABS 0.8  --   --   MONOABS 0.8  --   --   EOSABS 0.2  --   --   BASOSABS 0.0  --   --     Chemistries   Recent Labs Lab 08/03/13 0810 08/03/13 1520 08/04/13 0400  NA 142  --  140  K 3.7  --  3.3*  CL 99  --  99  CO2 29  --  30  GLUCOSE 116*  --  131*  BUN 12  --  13  CREATININE 0.50 0.43* 0.43*  CALCIUM 9.2  --  9.0   ------------------------------------------------------------------------------------------------------------------ CrCl is unknown because both a height and  weight (above a minimum accepted value) are required for this calculation. ------------------------------------------------------------------------------------------------------------------ No results found for this basename: HGBA1C,  in the last 72 hours ------------------------------------------------------------------------------------------------------------------ No results found for this basename: CHOL, HDL, LDLCALC, TRIG, CHOLHDL, LDLDIRECT,  in the last 72 hours ------------------------------------------------------------------------------------------------------------------  Recent Labs  08/03/13 1520  TSH 4.280   ------------------------------------------------------------------------------------------------------------------ No results found for this basename: VITAMINB12, FOLATE, FERRITIN, TIBC, IRON, RETICCTPCT,  in the last 72 hours  Coagulation profile No results found for this basename: INR, PROTIME,  in the last 168 hours  No results found for this basename: DDIMER,  in the last 72 hours  Cardiac Enzymes No results found for this basename: CK, CKMB, TROPONINI, MYOGLOBIN,  in the last 168 hours ------------------------------------------------------------------------------------------------------------------ No components found with this basename: POCBNP,      Time Spent in minutes   35   SINGH,PRASHANT K M.D on 08/04/2013 at 9:51 AM  Between 7am to 7pm - Pager - [phone]  After 7pm go to [email or web address] - password [email or web address]  And look for the night coverage person covering for me after hours  [facility] Office  [phone]   **Disclaimer: This note may have been dictated with voice recognition software. Similar sounding words can inadvertently be transcribed and this note may contain transcription errors which may not have been corrected upon publication of note.**

## 2013-08-04 NOTE — Progress Notes (Signed)
UR Completed.  Elizabeth [name] Jane [phone] 08/04/2013  

## 2013-08-05 LAB — MAGNESIUM: Magnesium: 2 mg/dL (ref 1.5–2.5)

## 2013-08-05 LAB — POTASSIUM: Potassium: 4.2 mEq/L (ref 3.7–5.3)

## 2013-08-05 MED ORDER — POTASSIUM CHLORIDE CRYS ER 20 MEQ PO TBCR
40.0000 meq | EXTENDED_RELEASE_TABLET | ORAL | Status: AC
  Administered 2013-08-05 (×2): 40 meq via ORAL
  Filled 2013-08-05 (×2): qty 2

## 2013-08-05 MED ORDER — MAGNESIUM SULFATE IN D5W 10-5 MG/ML-% IV SOLN
1.0000 g | Freq: Once | INTRAVENOUS | Status: AC
  Administered 2013-08-05: 1 g via INTRAVENOUS
  Filled 2013-08-05: qty 100

## 2013-08-05 NOTE — Progress Notes (Signed)
Patient Demographics  Elizabeth [name], is a 80 y.o. female, DOB - 1923/02/25, [id number]  Admit date - 08/03/2013   Admitting Physician Leroy SeaPrashant K Singh, MD  Outpatient Primary MD for the patient is Aida PufferLITTLE,JAMES, MD  LOS - 2   Chief Complaint  Patient presents with  . Back Pain       Subjective:    Elizabeth [name] has, No headache, No chest pain, No abdominal pain - No Nausea, No new weakness tingling or numbness, No Cough - SOB. +ve Low Back Pain.    Assessment & Plan    1. Low back pain due to L-spine compression fracture. Case discussed by me with neurosurgeon Dr.Nudlemann who recommended an Aspen back brace which has been ordered, 48 hours of bedrest which was completed, we'll now increase activity and have  PT evaluate, will need placement. Fracture due to severe osteoporosis.      2. Severe osteoporosis - placed calcium vitamin D. Then Outpatient monitor by PCP.      3. UTI. Follow urine cultures placed on emperic  IV Rocephin x 3 days     4. Massive stool burden with constipation. Initiated bowel regimen, improved ++ BMs.     5. Hypothyroidism. Continue home dose Synthroid.   Lab Results  Component Value Date   TSH 4.280 08/03/2013        6. History of pancreatic insufficiency. Continue pancreatic enzymes orally.      7. Chronic systolic CHF with EF 25% - continue home dose beta blocker along with Lasix. Monitor clinically closely.     8.Low K - replaced, recheck with a magnesium level this evening.       Code Status: DNR  Family Communication:  None present  Disposition Plan: SNF   Procedures CT Abdomen Pelvis Wo Contrast   Consults N Surg - Dr Jule SerNudleman over the Phone   Medications  Scheduled Meds: . calcium-vitamin D  2 tablet  Oral BID  . cefTRIAXone (ROCEPHIN)  IV  1 g Intravenous Q24H  . fentaNYL  12.5 mcg Transdermal Q72H  . furosemide  20 mg Oral BID  . heparin  5,000 Units Subcutaneous 3 times per day  . levothyroxine  100 mcg Oral QAC breakfast  . lipase/protease/amylase  2 capsule Oral TID AC  . magnesium sulfate 1 - 4 g bolus IVPB  1 g Intravenous Once  . metoprolol tartrate  12.5 mg Oral BID  . potassium chloride  10 mEq Oral Daily  . potassium chloride  40 mEq Oral Q4H   Continuous Infusions:  PRN Meds:.guaiFENesin-dextromethorphan, HYDROcodone-acetaminophen, ondansetron (ZOFRAN) IV, ondansetron, polyethylene glycol, traMADol  DVT Prophylaxis   Heparin   Lab Results  Component Value Date   PLT 183 08/04/2013    Antibiotics   Anti-infectives   Start     Dose/Rate Route Frequency Ordered Stop   08/04/13 0800  cefTRIAXone (ROCEPHIN) 1 g in dextrose 5 % 50 mL IVPB     1 g 100 mL/hr over 30 Minutes Intravenous Every 24 hours 08/03/13 0953     08/03/13 0930  cefTRIAXone (ROCEPHIN) 1 g in dextrose 5 % 50 mL IVPB     1 g 100 mL/hr over 30 Minutes Intravenous  Once 08/03/13 0927 08/03/13 1010  Objective:   Filed Vitals:   08/04/13 1047 08/04/13 1412 08/04/13 1944 08/05/13 0640  BP: 129/56 130/59 139/62 132/64  Pulse:  58 71 63  Temp:  97.7 F (36.5 C) 98.4 F (36.9 C) 97.7 F (36.5 C)  TempSrc:  Oral Oral Oral  Resp:  15 16 16   Weight:      SpO2:  95% 96% 98%    Wt Readings from Last 3 Encounters:  08/04/13 53.6 kg (118 lb 2.7 oz)  03/24/13 51.075 kg (112 lb 9.6 oz)  03/17/13 51.075 kg (112 lb 9.6 oz)     Intake/Output Summary (Last 24 hours) at 08/05/13 0908 Last data filed at 08/05/13 1610  Gross per 24 hour  Intake    480 ml  Output      0 ml  Net    480 ml     Physical Exam  Awake Alert, Oriented X 3, No new F.N deficits, Normal affect [place].AT,PERRAL Supple Neck,No JVD, No cervical lymphadenopathy appriciated.  Symmetrical Chest wall movement, Good air  movement bilaterally, CTAB RRR,No Gallops,Rubs or new Murmurs, No Parasternal Heave +ve B.Sounds, Abd Soft, No tenderness, No organomegaly appriciated, No rebound - guarding or rigidity. No Cyanosis, Clubbing or edema, No new Rash or bruise     Data Review   Micro Results No results found for this or any previous visit (from the past 240 hour(s)).  Radiology Reports Ct Abdomen Pelvis Wo Contrast  08/03/2013   CLINICAL DATA:  Right-sided abdominal and flank pain.  EXAM: CT ABDOMEN AND PELVIS WITHOUT CONTRAST  TECHNIQUE: Multidetector CT imaging of the abdomen and pelvis was performed following the standard protocol without IV contrast.  COMPARISON:  Pelvis CT on 11/02/2006  FINDINGS: Images through the lung bases show small bilateral pleural effusions and bibasilar atelectasis. A moderate size hiatal hernia is seen containing both stomach and transverse colon. Cardiomegaly and diffuse coronary artery calcification noted.  Noncontrast images of the liver, pancreas, spleen, and adrenal glands are unremarkable in appearance. Small nonobstructive left lower pole renal calculi are seen as well as a small fluid attenuation renal cyst. A 1.9 cm subcapsular lesion is also seen in the posterior lower pole of the left kidney on image 36, which has higher than fluid attenuation but remains stable compared with previous pelvis CT in 2008. This is most consistent with a benign hemorrhagic or proteinaceous renal cyst. There is no evidence of hydronephrosis or ureteral calculi.  Calcified uterine fibroids are noted, largest measuring 5.5 cm. No definite lymphadenopathy identified within the abdomen or pelvis. Surgical clips seen from prior cholecystectomy. No evidence of acute inflammatory process or abnormal fluid collections. Large colonic stool burden noted, however there is no evidence of bowel obstruction.  Osteopenia and multiple lower thoracic and lumbar vertebral body compression fracture deformities are noted.  No suspicious bone lesions identified. Fixation hardware noted in left hip.  IMPRESSION: Tiny nonobstructive left renal calculi. No evidence of ureteral calculi or hydronephrosis.  Calcified uterine fibroids, largest measuring 5.5 cm.  Large stool burden noted; suggest clinical correlation for possible constipation.  Moderate size hiatal hernia containing stomach and transverse colon.  Multiple osteopenic lower thoracic and lumbar vertebral body compression fractures.  Small bilateral pleural effusions and bibasilar atelectasis.  Cardiomegaly and diffuse coronary artery calcification.   Electronically Signed   By: Myles Rosenthal M.D.   On: 08/03/2013 09:23   Dg Lumbar Spine Complete  08/01/2013   CLINICAL DATA:  Low back pain.  No known injury.  EXAM:  LUMBAR SPINE - COMPLETE 4+ VIEW  COMPARISON:  None.  FINDINGS: There is a L4 compression fracture of uncertain age. There is also mild central depression of the L2 vertebral body. No destructive bony changes. The alignment is maintained. Moderate osteoporosis. Moderate multilevel facet disease but no definite pars defects. There are aortoiliac vascular calcifications but no definite aneurysm. Calcified uterine fibroids are noted.  IMPRESSION: L2 and L4 compression deformities of uncertain age.   Electronically Signed   By: Loralie ChampagneMark  Gallerani M.D.   On: 08/01/2013 08:27    CBC  Recent Labs Lab 08/03/13 0810 08/03/13 1520 08/04/13 0400  WBC 7.5 6.9 8.3  HGB 13.2 12.1 11.0*  HCT 40.6 37.2 34.2*  PLT 197 197 183  MCV 97.4 97.1 97.4  MCH 31.7 31.6 31.3  MCHC 32.5 32.5 32.2  RDW 15.5 15.6* 15.7*  LYMPHSABS 0.8  --   --   MONOABS 0.8  --   --   EOSABS 0.2  --   --   BASOSABS 0.0  --   --     Chemistries   Recent Labs Lab 08/03/13 0810 08/03/13 1520 08/04/13 0400  NA 142  --  140  K 3.7  --  3.3*  CL 99  --  99  CO2 29  --  30  GLUCOSE 116*  --  131*  BUN 12  --  13  CREATININE 0.50 0.43* 0.43*  CALCIUM 9.2  --  9.0    ------------------------------------------------------------------------------------------------------------------ CrCl is unknown because both a height and weight (above a minimum accepted value) are required for this calculation. ------------------------------------------------------------------------------------------------------------------ No results found for this basename: HGBA1C,  in the last 72 hours ------------------------------------------------------------------------------------------------------------------ No results found for this basename: CHOL, HDL, LDLCALC, TRIG, CHOLHDL, LDLDIRECT,  in the last 72 hours ------------------------------------------------------------------------------------------------------------------  Recent Labs  08/03/13 1520  TSH 4.280   ------------------------------------------------------------------------------------------------------------------ No results found for this basename: VITAMINB12, FOLATE, FERRITIN, TIBC, IRON, RETICCTPCT,  in the last 72 hours  Coagulation profile No results found for this basename: INR, PROTIME,  in the last 168 hours  No results found for this basename: DDIMER,  in the last 72 hours  Cardiac Enzymes No results found for this basename: CK, CKMB, TROPONINI, MYOGLOBIN,  in the last 168 hours ------------------------------------------------------------------------------------------------------------------ No components found with this basename: POCBNP,      Time Spent in minutes   35   SINGH,PRASHANT K M.D on 08/05/2013 at 9:08 AM  Between 7am to 7pm - Pager - [phone]  After 7pm go to [email or web address] - password [email or web address]  And look for the night coverage person covering for me after hours  [facility] Office  [phone]   **Disclaimer: This note may have been dictated with voice recognition software. Similar sounding words can inadvertently be transcribed and this note may contain  transcription errors which may not have been corrected upon publication of note.**

## 2013-08-05 NOTE — Evaluation (Signed)
Physical Therapy Evaluation Patient Details Name: Elizabeth [name] MRN: [id number] DOB: [birth year] Today's Date: 08/05/2013   History of Present Illness  80 y.o. female admitted with Multiple osteopenic lower thoracic and lumbar vertebral body compression fractures. Hx of HTN, LBBB, and CHF.  Clinical Impression  Patient is admitted with the above resulting in functional limitations due to the deficits listed below (see PT Problem List). Pt able to ambulate up to 15 feet with rolling walker and min assist. Complains of difficulty breathing with aspen brace correctly positioned, but states it is "tolerable for now". Patient will benefit from skilled PT to increase their independence and safety with mobility to allow discharge to the venue listed below.       Follow Up Recommendations SNF    Equipment Recommendations  3in1 (PT)    Recommendations for Other Services       Precautions / Restrictions Precautions Precautions: Back Precaution Comments: Reviewed back precautions. Practiced don/doffing aspen brace. Required Braces or Orthoses: Spinal Brace Spinal Brace: Other (comment) (ASPEN BRACE) Spinal Brace Comments: "On when out of bed" Restrictions Weight Bearing Restrictions: No      Mobility  Bed Mobility                  Transfers Overall transfer level: Needs assistance Equipment used: Rolling walker (2 wheeled) Transfers: Sit to/from Stand Sit to Stand: Min assist;+2 safety/equipment         General transfer comment: Min assist +2 for boost from recliner. Performed x3. VCs for hand placement and to maintain back precautions.  Ambulation/Gait Ambulation/Gait assistance: Min assist Ambulation Distance (Feet): 15 Feet Assistive device: Rolling walker (2 wheeled) Gait Pattern/deviations: Step-to pattern;Decreased step length - right;Decreased step length - left;Decreased stride length;Trunk flexed   Gait velocity interpretation: Below normal speed for  age/gender General Gait Details: Educatd on safe DME use with rolling walker. Min assist for walker placement. VCs for sequencing. Pt uses step-to pattern very slow and guarded gait.  Stairs            Wheelchair Mobility    Modified Rankin (Stroke Patients Only)       Balance Overall balance assessment: Needs assistance Sitting-balance support: No upper extremity supported;Feet supported Sitting balance-Leahy Scale: Fair     Standing balance support: Single extremity supported Standing balance-Leahy Scale: Poor                               Pertinent Vitals/Pain Pt reports pain in back as moderate. No numerical value given Nurse aware Patient repositioned in chair for comfort.     Home Living Family/patient expects to be discharged to:: Skilled nursing facility Living Arrangements: Alone;Other (Comment) (occasionally nephew stays with her) Available Help at Discharge: Skilled Nursing Facility Type of Home: Apartment Home Access: Stairs to enter Entrance Stairs-Rails: Right Entrance Stairs-Number of Steps: 14 Home Layout: One level Home Equipment: Walker - 2 wheels;Shower seat;Wheelchair - manual      Prior Function Level of Independence: Needs assistance   Gait / Transfers Assistance Needed: pt states she always uses RW  ADL's / Homemaking Assistance Needed: nephew does all housework and driving, he assists pt with bathing        Hand Dominance   Dominant Hand: Right    Extremity/Trunk Assessment   Upper Extremity Assessment: Defer to OT evaluation           Lower Extremity Assessment: Generalized weakness  Communication   Communication: HOH  Cognition Arousal/Alertness: Awake/alert Behavior During Therapy: WFL for tasks assessed/performed Overall Cognitive Status: No family/caregiver present to determine baseline cognitive functioning Area of Impairment: Orientation Orientation Level: Disoriented to;Situation    Memory: Decreased recall of precautions              General Comments General comments (skin integrity, edema, etc.): Pt does not tolerate aspen brace well, complains of difficulty breathing when it is adjusted for proper and safe fitting.    Exercises General Exercises - Lower Extremity Ankle Circles/Pumps: AROM;Both;10 reps;Seated      Assessment/Plan    PT Assessment Patient needs continued PT services  PT Diagnosis Difficulty walking;Abnormality of gait;Generalized weakness;Acute pain   PT Problem List Decreased strength;Decreased range of motion;Decreased activity tolerance;Decreased balance;Decreased mobility;Decreased cognition;Decreased knowledge of use of DME;Decreased knowledge of precautions;Pain;Cardiopulmonary status limiting activity  PT Treatment Interventions DME instruction;Gait training;Functional mobility training;Therapeutic activities;Therapeutic exercise;Balance training;Neuromuscular re-education;Cognitive remediation;Patient/family education;Modalities   PT Goals (Current goals can be found in the Care Plan section) Acute Rehab PT Goals Patient Stated Goal: Go to rehab, then go home PT Goal Formulation: With patient Time For Goal Achievement: 08/12/13 Potential to Achieve Goals: Good    Frequency Min 3X/week   Barriers to discharge Decreased caregiver support Pt lives alone    Co-evaluation               End of Session Equipment Utilized During Treatment: Back brace Activity Tolerance: Patient tolerated treatment well Patient left: in chair;with call bell/phone within reach Nurse Communication: Mobility status         Time: 1030-1056 PT Time Calculation (min): 26 min   Charges:   PT Evaluation $Initial PT Evaluation Tier I: 1 Procedure PT Treatments $Therapeutic Activity: 8-22 mins   PT G Codes:        Elizabeth [name], [place] [phone]   Berton Mount[place]bour, Elizabeth Ponzo S 08/05/2013, 11:08 AM

## 2013-08-06 MED ORDER — ALUM & MAG HYDROXIDE-SIMETH 200-200-20 MG/5ML PO SUSP
30.0000 mL | Freq: Four times a day (QID) | ORAL | Status: DC | PRN
  Filled 2013-08-06: qty 30

## 2013-08-06 MED ORDER — DOCUSATE SODIUM 100 MG PO CAPS
200.0000 mg | ORAL_CAPSULE | Freq: Two times a day (BID) | ORAL | Status: DC
  Administered 2013-08-06 – 2013-08-08 (×5): 200 mg via ORAL
  Filled 2013-08-06 (×7): qty 2

## 2013-08-06 MED ORDER — BISACODYL 10 MG RE SUPP: 10.0000 mg | Freq: Every day | RECTAL | Status: DC

## 2013-08-06 MED ORDER — ONDANSETRON HCL 4 MG/2ML IJ SOLN
4.0000 mg | INTRAMUSCULAR | Status: DC | PRN
  Administered 2013-08-06: 4 mg via INTRAVENOUS
  Filled 2013-08-06: qty 2

## 2013-08-06 MED ORDER — ALUM & MAG HYDROXIDE-SIMETH 200-200-20 MG/5ML PO SUSP
30.0000 mL | Freq: Once | ORAL | Status: AC
Start: 2013-08-06 — End: 2013-08-06
  Administered 2013-08-06: 30 mL via ORAL
  Filled 2013-08-06: qty 30

## 2013-08-06 MED ORDER — HYDROCODONE-ACETAMINOPHEN 5-325 MG PO TABS
1.0000 | ORAL_TABLET | Freq: Four times a day (QID) | ORAL | Status: DC | PRN
  Administered 2013-08-06: 1 via ORAL
  Filled 2013-08-06: qty 1

## 2013-08-06 NOTE — Progress Notes (Signed)
Patient Demographics  Elizabeth [name], is a 80 y.o. female, DOB - 1923/04/09, [id number]  Admit date - 08/03/2013   Admitting Physician Leroy SeaPrashant K Singh, MD  Outpatient Primary MD for the patient is Aida PufferLITTLE,JAMES, MD  LOS - 3   Chief Complaint  Patient presents with  . Back Pain       Subjective:    Elizabeth [name] today has, No headache, No chest pain, No abdominal pain - No Nausea, No new weakness tingling or numbness, No Cough - SOB. +ve Low Back Pain but improved.    Assessment & Plan    1. Low back pain due to L-spine compression fracture. Case discussed by me with neurosurgeon Dr.Nudlemann who recommended an Aspen back brace which has been ordered, 48 hours of bedrest which was completed, we'll now increase activity, PT following, will need placement. Fracture due to severe osteoporosis.      2. Severe osteoporosis - placed calcium vitamin D. Then Outpatient monitor by PCP.      3. UTI. Follow urine cultures placed on emperic  IV Rocephin x 3 days     4. Massive stool burden with constipation. On bowel regimen, improved ++ BMs.     5. Hypothyroidism. Continue home dose Synthroid.   Lab Results  Component Value Date   TSH 4.280 08/03/2013        6. History of pancreatic insufficiency. Continue pancreatic enzymes orally.      7. Chronic systolic CHF with EF 25% - continue home dose beta blocker along with Lasix. Monitor clinically closely.     8.Low K - replaced & stable.       Code Status: DNR  Family Communication:  None present  Disposition Plan: SNF   Procedures CT Abdomen Pelvis Wo Contrast   Consults N Surg - Dr Jule SerNudleman over the Phone   Medications  Scheduled Meds: . bisacodyl  10 mg Rectal Daily  . calcium-vitamin D  2 tablet  Oral BID  . cefTRIAXone (ROCEPHIN)  IV  1 g Intravenous Q24H  . docusate sodium  200 mg Oral BID  . fentaNYL  12.5 mcg Transdermal Q72H  . furosemide  20 mg Oral BID  . heparin  5,000 Units Subcutaneous 3 times per day  . levothyroxine  100 mcg Oral QAC breakfast  . lipase/protease/amylase  2 capsule Oral TID AC  . metoprolol tartrate  12.5 mg Oral BID  . potassium chloride  10 mEq Oral Daily   Continuous Infusions:  PRN Meds:.guaiFENesin-dextromethorphan, HYDROcodone-acetaminophen, ondansetron (ZOFRAN) IV, polyethylene glycol, traMADol  DVT Prophylaxis   Heparin   Lab Results  Component Value Date   PLT 183 08/04/2013    Antibiotics   Anti-infectives   Start     Dose/Rate Route Frequency Ordered Stop   08/04/13 0800  cefTRIAXone (ROCEPHIN) 1 g in dextrose 5 % 50 mL IVPB     1 g 100 mL/hr over 30 Minutes Intravenous Every 24 hours 08/03/13 0953 08/07/13 0759   08/03/13 0930  cefTRIAXone (ROCEPHIN) 1 g in dextrose 5 % 50 mL IVPB     1 g 100 mL/hr over 30 Minutes Intravenous  Once 08/03/13 0927 08/03/13 1010          Objective:   Filed Vitals:  08/05/13 1427 08/05/13 2102 08/06/13 0300 08/06/13 0651  BP: 128/56 133/64  137/63  Pulse: 61 77  64  Temp: 97.3 F (36.3 C) 97.6 F (36.4 C)  98.2 F (36.8 C)  TempSrc: Oral Oral  Oral  Resp: 16 16  18   Height:   5\' 8"  (1.727 m)   Weight:   53.524 kg (118 lb)   SpO2: 99% 96%  98%    Wt Readings from Last 3 Encounters:  08/06/13 53.524 kg (118 lb)  03/24/13 51.075 kg (112 lb 9.6 oz)  03/17/13 51.075 kg (112 lb 9.6 oz)     Intake/Output Summary (Last 24 hours) at 08/06/13 0838 Last data filed at 08/06/13 0653  Gross per 24 hour  Intake    840 ml  Output      0 ml  Net    840 ml     Physical Exam  Awake Alert, Oriented X 3, No new F.N deficits, Normal affect, hard of hearing [place].AT,PERRAL Supple Neck,No JVD, No cervical lymphadenopathy appriciated.  Symmetrical Chest wall movement, Good air movement  bilaterally, CTAB RRR,No Gallops,Rubs or new Murmurs, No Parasternal Heave +ve B.Sounds, Abd Soft, No tenderness, No organomegaly appriciated, No rebound - guarding or rigidity. No Cyanosis, Clubbing or edema, No new Rash or bruise     Data Review   Micro Results No results found for this or any previous visit (from the past 240 hour(s)).  Radiology Reports Ct Abdomen Pelvis Wo Contrast  08/03/2013   CLINICAL DATA:  Right-sided abdominal and flank pain.  EXAM: CT ABDOMEN AND PELVIS WITHOUT CONTRAST  TECHNIQUE: Multidetector CT imaging of the abdomen and pelvis was performed following the standard protocol without IV contrast.  COMPARISON:  Pelvis CT on 11/02/2006  FINDINGS: Images through the lung bases show small bilateral pleural effusions and bibasilar atelectasis. A moderate size hiatal hernia is seen containing both stomach and transverse colon. Cardiomegaly and diffuse coronary artery calcification noted.  Noncontrast images of the liver, pancreas, spleen, and adrenal glands are unremarkable in appearance. Small nonobstructive left lower pole renal calculi are seen as well as a small fluid attenuation renal cyst. A 1.9 cm subcapsular lesion is also seen in the posterior lower pole of the left kidney on image 36, which has higher than fluid attenuation but remains stable compared with previous pelvis CT in 2008. This is most consistent with a benign hemorrhagic or proteinaceous renal cyst. There is no evidence of hydronephrosis or ureteral calculi.  Calcified uterine fibroids are noted, largest measuring 5.5 cm. No definite lymphadenopathy identified within the abdomen or pelvis. Surgical clips seen from prior cholecystectomy. No evidence of acute inflammatory process or abnormal fluid collections. Large colonic stool burden noted, however there is no evidence of bowel obstruction.  Osteopenia and multiple lower thoracic and lumbar vertebral body compression fracture deformities are noted. No  suspicious bone lesions identified. Fixation hardware noted in left hip.  IMPRESSION: Tiny nonobstructive left renal calculi. No evidence of ureteral calculi or hydronephrosis.  Calcified uterine fibroids, largest measuring 5.5 cm.  Large stool burden noted; suggest clinical correlation for possible constipation.  Moderate size hiatal hernia containing stomach and transverse colon.  Multiple osteopenic lower thoracic and lumbar vertebral body compression fractures.  Small bilateral pleural effusions and bibasilar atelectasis.  Cardiomegaly and diffuse coronary artery calcification.   Electronically Signed   By: Myles RosenthalJohn  Stahl M.D.   On: 08/03/2013 09:23   Dg Lumbar Spine Complete  08/01/2013   CLINICAL DATA:  Low back pain.  No known injury.  EXAM: LUMBAR SPINE - COMPLETE 4+ VIEW  COMPARISON:  None.  FINDINGS: There is a L4 compression fracture of uncertain age. There is also mild central depression of the L2 vertebral body. No destructive bony changes. The alignment is maintained. Moderate osteoporosis. Moderate multilevel facet disease but no definite pars defects. There are aortoiliac vascular calcifications but no definite aneurysm. Calcified uterine fibroids are noted.  IMPRESSION: L2 and L4 compression deformities of uncertain age.   Electronically Signed   By: Loralie Champagne M.D.   On: 08/01/2013 08:27    CBC  Recent Labs Lab 08/03/13 0810 08/03/13 1520 08/04/13 0400  WBC 7.5 6.9 8.3  HGB 13.2 12.1 11.0*  HCT 40.6 37.2 34.2*  PLT 197 197 183  MCV 97.4 97.1 97.4  MCH 31.7 31.6 31.3  MCHC 32.5 32.5 32.2  RDW 15.5 15.6* 15.7*  LYMPHSABS 0.8  --   --   MONOABS 0.8  --   --   EOSABS 0.2  --   --   BASOSABS 0.0  --   --     Chemistries   Recent Labs Lab 08/03/13 0810 08/03/13 1520 08/04/13 0400 08/05/13 1510  NA 142  --  140  --   K 3.7  --  3.3* 4.2  CL 99  --  99  --   CO2 29  --  30  --   GLUCOSE 116*  --  131*  --   BUN 12  --  13  --   CREATININE 0.50 0.43* 0.43*  --     CALCIUM 9.2  --  9.0  --   MG  --   --   --  2.0   ------------------------------------------------------------------------------------------------------------------ estimated creatinine clearance is 41.1 ml/min (by C-G formula based on Cr of 0.43). ------------------------------------------------------------------------------------------------------------------ No results found for this basename: HGBA1C,  in the last 72 hours ------------------------------------------------------------------------------------------------------------------ No results found for this basename: CHOL, HDL, LDLCALC, TRIG, CHOLHDL, LDLDIRECT,  in the last 72 hours ------------------------------------------------------------------------------------------------------------------  Recent Labs  08/03/13 1520  TSH 4.280   ------------------------------------------------------------------------------------------------------------------ No results found for this basename: VITAMINB12, FOLATE, FERRITIN, TIBC, IRON, RETICCTPCT,  in the last 72 hours  Coagulation profile No results found for this basename: INR, PROTIME,  in the last 168 hours  No results found for this basename: DDIMER,  in the last 72 hours  Cardiac Enzymes No results found for this basename: CK, CKMB, TROPONINI, MYOGLOBIN,  in the last 168 hours ------------------------------------------------------------------------------------------------------------------ No components found with this basename: POCBNP,      Time Spent in minutes   35   SINGH,PRASHANT K M.D on 08/06/2013 at 8:38 AM  Between 7am to 7pm - Pager - [phone]  After 7pm go to [email or web address] - password [email or web address]  And look for the night coverage person covering for me after hours  [facility] Office  [phone]   **Disclaimer: This note may have been dictated with voice recognition software. Similar sounding words can inadvertently be transcribed and this note  may contain transcription errors which may not have been corrected upon publication of note.**

## 2013-08-06 NOTE — Progress Notes (Signed)
Clinical Child psychotherapistocial Worker (CSW) gave patient bed offers. Patient reported that she wants to go to [facility]. CSW explained that PT is not recommending [facility] and encouraged her to chose from the list of bed offers. Patient reported that she would discuss offers with her family. CSW will continue to follow and assist as needed.   Jetta LoutBailey Morgan, LCSWA Weekend CSW [phone]

## 2013-08-07 ENCOUNTER — Inpatient Hospital Stay (HOSPITAL_COMMUNITY): Payer: Medicare Other

## 2013-08-07 MED ORDER — SORBITOL 70 % SOLN
960.0000 mL | TOPICAL_OIL | Freq: Once | ORAL | Status: AC
  Administered 2013-08-07: 960 mL via RECTAL
  Filled 2013-08-07: qty 240

## 2013-08-07 MED ORDER — PROMETHAZINE HCL 25 MG/ML IJ SOLN
6.2500 mg | Freq: Once | INTRAMUSCULAR | Status: AC
  Administered 2013-08-07: 6.25 mg via INTRAVENOUS
  Filled 2013-08-07: qty 1

## 2013-08-07 MED ORDER — GI COCKTAIL ~~LOC~~
30.0000 mL | Freq: Three times a day (TID) | ORAL | Status: DC | PRN
  Filled 2013-08-07: qty 30

## 2013-08-07 NOTE — Progress Notes (Signed)
Patient Demographics  Elizabeth [name], is a 80 y.o. female, DOB - 1923-06-08, RUE:[id number]  Admit date - 08/03/2013   Admitting Physician Leroy SeaPrashant K Mckenna Gamm, MD  Outpatient Primary MD for the patient is Aida PufferLITTLE,JAMES, MD  LOS - 4   Chief Complaint  Patient presents with  . Back Pain       Subjective:    Elizabeth [name] today has, No headache, No chest pain, No abdominal pain - No Nausea, No new weakness tingling or numbness, No Cough - SOB. +ve Low Back Pain but improved.    Assessment & Plan    1. Low back pain due to L-spine compression fracture. Case discussed by me with neurosurgeon Dr.Nudlemann who recommended an Aspen back brace which has been ordered, 48 hours of bedrest which was completed, we'll now increase activity, PT following, will need placement. Fracture due to severe osteoporosis.      2. Severe osteoporosis - placed calcium vitamin D. Then Outpatient monitor by PCP.      3. UTI. Follow urine cultures placed on emperic  IV Rocephin x 3 days     4. Massive stool burden with constipation. Had BM but still has large stool burden and now nauseated, will continue bowel regimen but given one dose of SMOG enema. X-ray shows no obstruction.     5. Hypothyroidism. Continue home dose Synthroid.   Lab Results  Component Value Date   TSH 4.280 08/03/2013        6. History of pancreatic insufficiency. Continue pancreatic enzymes orally.      7. Chronic systolic CHF with EF 25% - continue home dose beta blocker along with Lasix. Monitor clinically closely.     8.Low K - replaced & stable.       Code Status: DNR  Family Communication:  None present  Disposition Plan: SNF   Procedures CT Abdomen Pelvis Wo Contrast   Consults N Surg - Dr Jule SerNudleman over  the Phone   Medications  Scheduled Meds: . bisacodyl  10 mg Rectal Daily  . calcium-vitamin D  2 tablet Oral BID  . docusate sodium  200 mg Oral BID  . fentaNYL  12.5 mcg Transdermal Q72H  . furosemide  20 mg Oral BID  . heparin  5,000 Units Subcutaneous 3 times per day  . levothyroxine  100 mcg Oral QAC breakfast  . lipase/protease/amylase  2 capsule Oral TID AC  . metoprolol tartrate  12.5 mg Oral BID  . potassium chloride  10 mEq Oral Daily  . sorbitol, milk of mag, mineral oil, glycerin (SMOG) enema  960 mL Rectal Once   Continuous Infusions:  PRN Meds:.alum & mag hydroxide-simeth, guaiFENesin-dextromethorphan, HYDROcodone-acetaminophen, ondansetron (ZOFRAN) IV, polyethylene glycol, traMADol  DVT Prophylaxis   Heparin   Lab Results  Component Value Date   PLT 183 08/04/2013    Antibiotics   Anti-infectives   Start     Dose/Rate Route Frequency Ordered Stop   08/04/13 0800  cefTRIAXone (ROCEPHIN) 1 g in dextrose 5 % 50 mL IVPB     1 g 100 mL/hr over 30 Minutes Intravenous Every 24 hours 08/03/13 0953 08/06/13 1135   08/03/13 0930  cefTRIAXone (ROCEPHIN) 1 g in dextrose 5 % 50 mL IVPB  1 g 100 mL/hr over 30 Minutes Intravenous  Once 08/03/13 0927 08/03/13 1010          Objective:   Filed Vitals:   08/06/13 1059 08/06/13 1528 08/06/13 2049 08/07/13 0506  BP: 137/63 140/69 135/65 133/62  Pulse:  72 89 76  Temp:  98.2 F (36.8 C) 97.3 F (36.3 C) 97.7 F (36.5 C)  TempSrc:  Oral    Resp:  18 18 16   Height:      Weight:      SpO2:  98% 98% 95%    Wt Readings from Last 3 Encounters:  08/06/13 53.524 kg (118 lb)  03/24/13 51.075 kg (112 lb 9.6 oz)  03/17/13 51.075 kg (112 lb 9.6 oz)     Intake/Output Summary (Last 24 hours) at 08/07/13 1037 Last data filed at 08/06/13 1700  Gross per 24 hour  Intake    670 ml  Output      0 ml  Net    670 ml     Physical Exam  Awake Alert, Oriented X 3, No new F.N deficits, Normal affect, hard of  hearing [place].AT,PERRAL Supple Neck,No JVD, No cervical lymphadenopathy appriciated.  Symmetrical Chest wall movement, Good air movement bilaterally, CTAB RRR,No Gallops,Rubs or new Murmurs, No Parasternal Heave +ve B.Sounds, Abd Soft, No tenderness, No organomegaly appriciated, No rebound - guarding or rigidity. No Cyanosis, Clubbing or edema, No new Rash or bruise     Data Review   Micro Results No results found for this or any previous visit (from the past 240 hour(s)).  Radiology Reports Ct Abdomen Pelvis Wo Contrast  08/03/2013   CLINICAL DATA:  Right-sided abdominal and flank pain.  EXAM: CT ABDOMEN AND PELVIS WITHOUT CONTRAST  TECHNIQUE: Multidetector CT imaging of the abdomen and pelvis was performed following the standard protocol without IV contrast.  COMPARISON:  Pelvis CT on 11/02/2006  FINDINGS: Images through the lung bases show small bilateral pleural effusions and bibasilar atelectasis. A moderate size hiatal hernia is seen containing both stomach and transverse colon. Cardiomegaly and diffuse coronary artery calcification noted.  Noncontrast images of the liver, pancreas, spleen, and adrenal glands are unremarkable in appearance. Small nonobstructive left lower pole renal calculi are seen as well as a small fluid attenuation renal cyst. A 1.9 cm subcapsular lesion is also seen in the posterior lower pole of the left kidney on image 36, which has higher than fluid attenuation but remains stable compared with previous pelvis CT in 2008. This is most consistent with a benign hemorrhagic or proteinaceous renal cyst. There is no evidence of hydronephrosis or ureteral calculi.  Calcified uterine fibroids are noted, largest measuring 5.5 cm. No definite lymphadenopathy identified within the abdomen or pelvis. Surgical clips seen from prior cholecystectomy. No evidence of acute inflammatory process or abnormal fluid collections. Large colonic stool burden noted, however there is no evidence of  bowel obstruction.  Osteopenia and multiple lower thoracic and lumbar vertebral body compression fracture deformities are noted. No suspicious bone lesions identified. Fixation hardware noted in left hip.  IMPRESSION: Tiny nonobstructive left renal calculi. No evidence of ureteral calculi or hydronephrosis.  Calcified uterine fibroids, largest measuring 5.5 cm.  Large stool burden noted; suggest clinical correlation for possible constipation.  Moderate size hiatal hernia containing stomach and transverse colon.  Multiple osteopenic lower thoracic and lumbar vertebral body compression fractures.  Small bilateral pleural effusions and bibasilar atelectasis.  Cardiomegaly and diffuse coronary artery calcification.   Electronically Signed   By: Jonny RuizJohn  Eppie Gibson M.D.   On: 08/03/2013 09:23   Dg Lumbar Spine Complete  08/01/2013   CLINICAL DATA:  Low back pain.  No known injury.  EXAM: LUMBAR SPINE - COMPLETE 4+ VIEW  COMPARISON:  None.  FINDINGS: There is a L4 compression fracture of uncertain age. There is also mild central depression of the L2 vertebral body. No destructive bony changes. The alignment is maintained. Moderate osteoporosis. Moderate multilevel facet disease but no definite pars defects. There are aortoiliac vascular calcifications but no definite aneurysm. Calcified uterine fibroids are noted.  IMPRESSION: L2 and L4 compression deformities of uncertain age.   Electronically Signed   By: Loralie Champagne M.D.   On: 08/01/2013 08:27    CBC  Recent Labs Lab 08/03/13 0810 08/03/13 1520 08/04/13 0400  WBC 7.5 6.9 8.3  HGB 13.2 12.1 11.0*  HCT 40.6 37.2 34.2*  PLT 197 197 183  MCV 97.4 97.1 97.4  MCH 31.7 31.6 31.3  MCHC 32.5 32.5 32.2  RDW 15.5 15.6* 15.7*  LYMPHSABS 0.8  --   --   MONOABS 0.8  --   --   EOSABS 0.2  --   --   BASOSABS 0.0  --   --     Chemistries   Recent Labs Lab 08/03/13 0810 08/03/13 1520 08/04/13 0400 08/05/13 1510  NA 142  --  140  --   K 3.7  --  3.3* 4.2   CL 99  --  99  --   CO2 29  --  30  --   GLUCOSE 116*  --  131*  --   BUN 12  --  13  --   CREATININE 0.50 0.43* 0.43*  --   CALCIUM 9.2  --  9.0  --   MG  --   --   --  2.0   ------------------------------------------------------------------------------------------------------------------ estimated creatinine clearance is 41.1 ml/min (by C-G formula based on Cr of 0.43). ------------------------------------------------------------------------------------------------------------------ No results found for this basename: HGBA1C,  in the last 72 hours ------------------------------------------------------------------------------------------------------------------ No results found for this basename: CHOL, HDL, LDLCALC, TRIG, CHOLHDL, LDLDIRECT,  in the last 72 hours ------------------------------------------------------------------------------------------------------------------ No results found for this basename: TSH, T4TOTAL, FREET3, T3FREE, THYROIDAB,  in the last 72 hours ------------------------------------------------------------------------------------------------------------------ No results found for this basename: VITAMINB12, FOLATE, FERRITIN, TIBC, IRON, RETICCTPCT,  in the last 72 hours  Coagulation profile No results found for this basename: INR, PROTIME,  in the last 168 hours  No results found for this basename: DDIMER,  in the last 72 hours  Cardiac Enzymes No results found for this basename: CK, CKMB, TROPONINI, MYOGLOBIN,  in the last 168 hours ------------------------------------------------------------------------------------------------------------------ No components found with this basename: POCBNP,      Time Spent in minutes   35   Adamary Savary K M.D on 08/07/2013 at 10:37 AM  Between 7am to 7pm - Pager - [phone]  After 7pm go to [email or web address] - password [email or web address]  And look for the night coverage person covering for me after hours  [facility] Office  [phone]   **Disclaimer: This note may have been dictated with voice recognition software. Similar sounding words can inadvertently be transcribed and this note may contain transcription errors which may not have been corrected upon publication of note.**

## 2013-08-07 NOTE — Progress Notes (Signed)
I gave about half of smog enema to patient.  She tolerated enema poorly, while giving enema she said to stop, she could not stand it.  I explained to patient the need to proceed with enema several times today and she refuses to have enema.  She has had 2 small bowel movements.  Nsg to continue to monitor.

## 2013-08-07 NOTE — Progress Notes (Signed)
At 1230, pt was complaining about chest burning.  When I questioned patient about burning, she pointed to upper abdomen.  I asked her she was having chest pain, and she said yes.  EKG obtained, notified Dr. Thedore MinsSingh, new orders received.  Nsg to continue to monitor.

## 2013-08-08 MED ORDER — DSS 100 MG PO CAPS: 200.0000 mg | ORAL_CAPSULE | Freq: Two times a day (BID) | ORAL | Status: DC

## 2013-08-08 MED ORDER — POLYETHYLENE GLYCOL 3350 17 G PO PACK: 17.0000 g | PACK | Freq: Two times a day (BID) | ORAL | Status: AC

## 2013-08-08 MED ORDER — CALCIUM CARBONATE-VITAMIN D 500-200 MG-UNIT PO TABS: 2.0000 | ORAL_TABLET | Freq: Two times a day (BID) | ORAL | Status: AC

## 2013-08-08 MED ORDER — FENTANYL 12 MCG/HR TD PT72: 12.5000 ug | MEDICATED_PATCH | TRANSDERMAL | Status: DC

## 2013-08-08 MED ORDER — BISACODYL 10 MG RE SUPP: 10.0000 mg | Freq: Every day | RECTAL | Status: DC

## 2013-08-08 MED ORDER — TRAMADOL HCL 50 MG PO TABS: 50.0000 mg | ORAL_TABLET | Freq: Four times a day (QID) | ORAL | Status: DC | PRN

## 2013-08-08 NOTE — Discharge Summary (Signed)
Elizabeth [name], is a 80 y.o. female  DOB [birth year]  MRN [id number].  Admission date:  08/03/2013  Admitting Physician  Elizabeth Sea, [name]  Discharge Date:  08/08/2013   Primary [name]  Elizabeth [name]  Recommendations for primary care physician for things to follow:   Monitor clinically   Admission Diagnosis  Unspecified hypothyroidism [244.9] Essential hypertension, benign [401.1] Urinary tract infection, site not specified [599.0] Abnormality of gait [781.2] Compression fracture of lumbar vertebra, non-traumatic, initial encounter [733.13] Gastroesophageal reflux disease, esophagitis presence not specified [530.81]   Discharge Diagnosis  Unspecified hypothyroidism [244.9] Essential hypertension, benign [401.1] Urinary tract infection, site not specified [599.0] Abnormality of gait [781.2] Compression fracture of lumbar vertebra, non-traumatic, initial encounter [733.13] Gastroesophageal reflux disease, esophagitis presence not specified [530.81]    Principal Problem:   Compression fracture of lumbar vertebra, non-traumatic Active Problems:   HYPOTHYROIDISM   Essential hypertension, benign   UTI   GERD (gastroesophageal reflux disease)   Fracture of lumbar spine      Past Medical History  Diagnosis Date  . Thyroid disease   . Hypertension   . Abnormality of gait 04/21/2008    Qualifier: Diagnosis of  By: Sheffield Slider [name], Deniece Portela    . ALCOHOL ABUSE 04/21/2008    Qualifier: Diagnosis of  By: Sheffield Slider [name], Deniece Portela    . ANEMIA, SECONDARY TO ACUTE BLOOD LOSS 04/21/2008    Qualifier: Diagnosis of  By: Sheffield Slider [name], Deniece Portela    . DEPRESSION/ANXIETY 04/21/2008    Qualifier: Diagnosis of  By: Sheffield Slider [name], Deniece Portela    . Edema 05/23/2008    Qualifier: Diagnosis of  By: Eden Emms, [name], Harrington Challenger   . LOSS OF APPETITE 04/21/2008    Qualifier: Diagnosis  of  By: Sheffield Slider [name], Deniece Portela    . UTI 04/21/2008    Qualifier: Diagnosis of  By: Sheffield Slider [name], Deniece Portela    . CLOSED FRACTURE INTERTROCHANTERIC SECTION FEMUR 04/21/2008    Qualifier: Diagnosis of  By: Sheffield Slider [name], Deniece Portela    . OSTEOARTHRITIS, KNEE, LEFT, MILD 04/21/2008    Qualifier: Diagnosis of  By: Sheffield Slider [name], Deniece Portela    . OSTEOPOROSIS 04/21/2008    Qualifier: Diagnosis of  By: Sheffield Slider [name], Deniece Portela    . HYPOTHYROIDISM 04/21/2008    Qualifier: Diagnosis of  By: Sheffield Slider [name], Deniece Portela    . GALLBLADDER DISEASE 05/22/2008    Qualifier: Diagnosis of  By: Kem Parkinson    . Acute CHF (congestive heart failure) 01/21/2013  . Essential hypertension, benign 04/21/2008    Qualifier: Diagnosis of  By: Sheffield Slider [name], Deniece Portela    . LBBB 05/23/2008    Qualifier: Diagnosis of  By: Eden Emms, [name], Harrington Challenger     Past Surgical History  Procedure Laterality Date  . Cholecystectomy    . Femur im nail  09/09/2011    Procedure: INTRAMEDULLARY (IM) NAIL FEMORAL;  Surgeon: Kathryne Hitch, [name];  Location: [facility] OR;  Service: Orthopedics;  Laterality: Left;  distal interlocking screws       History of present  illness and  Hospital Course:     Kindly see H&P for history of present illness and admission details, please review complete Labs, Consult reports and Test reports for all details in brief  HPI  [name] is a 80 y.o. female, history of severe osteoporosis, hypertension, hypothyroidism, alcohol abuse in the past, Chronic systolic heart failure with EF 25%, left hip fracture status post open reduction internal fixation by Dr. Magnus Ivan and 2013, who lives by herself at home and ambulates with a walker presents to the ER with few day history of back pain, initially there was a question that patient had a fall however upon detailed interview patient says that she never incurred a fall in the last few months, this pain started spontaneously while she was ambulating and thinks she might have lifted something heavy causing back pain. Her back  pain is constant, dull, in the L-spine area, on going for the last few days, nonradiating, not related to any weakness-bowel or bladder incontinence, worse with movement better with rest.   She came to the ER with low back pain inability to get out of the bed or ambulate, in the ER workup consistent with L-spine compression fracture, UTI, severe stool burden and constipation, case was discussed with neurosurgeon Dr. Jule Ser and I was requested to admit the patient.    Hospital Course    1. Low back pain due to L-spine compression fracture. Case discussed by me with neurosurgeon Dr.Nudlemann who recommended an Aspen back brace which has been placed and is to be in place when she is out. Continue PT. Will require SNF. Fracture due to severe osteoporosis.     2. Severe osteoporosis - placed calcium vitamin D. Then Outpatient monitor by PCP.     3. UTI. Treated emperic IV Rocephin x 3 days, unit he resolved.    4. Massive stool burden with constipation. Had BM but still has large stool burden and now nauseated, will continue bowel regimen, him improvement after she received SMOG enema yesterday.    5. Hypothyroidism. Continue home dose Synthroid.  Lab Results   Component  Value  Date    TSH  4.280  08/03/2013      6. History of pancreatic insufficiency. Continue pancreatic enzymes orally.     7. Chronic systolic CHF with EF 25% - continue home dose beta blocker along with Lasix. Monitor clinically closely.     8.Low K - replaced & stable.       Discharge Condition: stable   Follow UP  Follow-up Information   Follow up with LITTLE,JAMES, [name]. Schedule an appointment as soon as possible for a visit in 1 week.   Specialty:  Family Medicine   Contact information:   [address] [place] [address] [phone]         Discharge Instructions  and  Discharge Medications          Discharge Instructions   Diet - low sodium heart healthy    Complete by:  As  directed      Discharge instructions    Complete by:  As directed   Follow with Primary [name] Elizabeth [name] in 7 days   Get CBC, CMP  checked  by Primary [name] or SNF M.D. next visit.    Activity: As tolerated with Full fall precautions use walker/cane & assistance as needed   Disposition Home     Diet: Heart Healthy with feeding assistance and aspiration precautions .  For Heart failure  patients - Check your Weight same time everyday, if you gain over 2 pounds, or you develop in leg swelling, experience more shortness of breath or chest pain, call your Primary [name] immediately. Follow Cardiac Low Salt Diet and 1.8 lit/day fluid restriction.   On your next visit with her primary care physician please Get Medicines reviewed and adjusted.  Please request your Prim.[name] to go over all Hospital Tests and Procedure/Radiological results at the follow up, please get all Hospital records sent to your Prim [name] by signing hospital release before you go home.   If you experience worsening of your admission symptoms, develop shortness of breath, life threatening emergency, suicidal or homicidal thoughts you must seek medical attention immediately by calling 911 or calling your [name] immediately  if symptoms less severe.  You Must read complete instructions/literature along with all the possible adverse reactions/side effects for all the Medicines you take and that have been prescribed to you. Take any new Medicines after you have completely understood and accpet all the possible adverse reactions/side effects.   Do not drive, operating heavy machinery, perform activities at heights, swimming or participation in water activities or provide baby sitting services if your were admitted for syncope or siezures until you have seen by Primary [name] or a Neurologist and advised to do so again.  Do not drive when taking Pain medications.    Do not take more than prescribed Pain, Sleep and Anxiety  Medications  Special Instructions: If you have smoked or chewed Tobacco  in the last 2 yrs please stop smoking, stop any regular Alcohol  and or any Recreational drug use.  Wear Seat belts while driving.   Please note  You were cared for by a hospitalist during your hospital stay. If you have any questions about your discharge medications or the care you received while you were in the hospital after you are discharged, you can call the unit and asked to speak with the hospitalist on call if the hospitalist that took care of you is not available. Once you are discharged, your primary care physician will handle any further medical issues. Please note that NO REFILLS for any discharge medications will be authorized once you are discharged, as it is imperative that you return to your primary care physician (or establish a relationship with a primary care physician if you do not have one) for your aftercare needs so that they can reassess your need for medications and monitor your lab values.  Follow with Primary [name] Elizabeth PufferLITTLE,JAMES, [name] in 7 days   Get CBC, CMP  checked  by Primary [name] or SNF M.D. next visit.    Activity: As tolerated with Full fall precautions use walker/cane & assistance as needed   Disposition Home     Diet: Heart Healthy with feeding assistance and aspiration precautions .  For Heart failure patients - Check your Weight same time everyday, if you gain over 2 pounds, or you develop in leg swelling, experience more shortness of breath or chest pain, call your Primary [name] immediately. Follow Cardiac Low Salt Diet and 1.8 lit/day fluid restriction.   On your next visit with her primary care physician please Get Medicines reviewed and adjusted.  Please request your Prim.[name] to go over all Hospital Tests and Procedure/Radiological results at the follow up, please get all Hospital records sent to your Prim [name] by signing hospital release before you go home.   If you experience  worsening of your admission symptoms, develop shortness of  breath, life threatening emergency, suicidal or homicidal thoughts you must seek medical attention immediately by calling 911 or calling your [name] immediately  if symptoms less severe.  You Must read complete instructions/literature along with all the possible adverse reactions/side effects for all the Medicines you take and that have been prescribed to you. Take any new Medicines after you have completely understood and accpet all the possible adverse reactions/side effects.   Do not drive, operating heavy machinery, perform activities at heights, swimming or participation in water activities or provide baby sitting services if your were admitted for syncope or siezures until you have seen by Primary [name] or a Neurologist and advised to do so again.  Do not drive when taking Pain medications.    Do not take more than prescribed Pain, Sleep and Anxiety Medications  Special Instructions: If you have smoked or chewed Tobacco  in the last 2 yrs please stop smoking, stop any regular Alcohol  and or any Recreational drug use.  Wear Seat belts while driving.   Please note  You were cared for by a hospitalist during your hospital stay. If you have any questions about your discharge medications or the care you received while you were in the hospital after you are discharged, you can call the unit and asked to speak with the hospitalist on call if the hospitalist that took care of you is not available. Once you are discharged, your primary care physician will handle any further medical issues. Please note that NO REFILLS for any discharge medications will be authorized once you are discharged, as it is imperative that you return to your primary care physician (or establish a relationship with a primary care physician if you do not have one) for your aftercare needs so that they can reassess your need for medications and monitor your lab values.             Medication List    STOP taking these medications       CALCIUM PO      TAKE these medications       bisacodyl 10 MG suppository  Commonly known as:  DULCOLAX  Place 1 suppository (10 mg total) rectally daily.     calcium-vitamin D 500-200 MG-UNIT per tablet  Commonly known as:  OSCAL WITH D  Take 2 tablets by mouth 2 (two) times daily.     DSS 100 MG Caps  Take 200 mg by mouth 2 (two) times daily.     fentaNYL 12 MCG/HR  Commonly known as:  DURAGESIC - dosed mcg/hr  Place 1 patch (12.5 mcg total) onto the skin every 3 (three) days.     furosemide 20 MG tablet  Commonly known as:  LASIX  Take 20 mg by mouth 2 (two) times daily.     levothyroxine 100 MCG tablet  Commonly known as:  SYNTHROID, LEVOTHROID  Take 100 mcg by mouth daily before breakfast.     lipase/protease/amylase [address] UNITS Cpep capsule  Commonly known as:  CREON-12/PANCREASE  Take 2 capsules by mouth 3 (three) times daily before meals. And take 60454 units with snacks     metoprolol tartrate 12.5 mg Tabs tablet  Commonly known as:  LOPRESSOR  Take 0.5 tablets (12.5 mg total) by mouth 2 (two) times daily.     polyethylene glycol packet  Commonly known as:  MIRALAX / GLYCOLAX  Take 17 g by mouth 2 (two) times daily.     potassium chloride 10 MEQ tablet  Commonly  known as:  K-DUR,KLOR-CON  Take 10 mEq by mouth daily.     promethazine 25 MG tablet  Commonly known as:  PHENERGAN  Take 25 mg by mouth every 6 (six) hours as needed for nausea or vomiting.     traMADol 50 MG tablet  Commonly known as:  ULTRAM  Take 1 tablet (50 mg total) by mouth every 6 (six) hours as needed for moderate pain.     Vitamin D (Ergocalciferol) 50000 UNITS Caps capsule  Commonly known as:  DRISDOL  Take 50,000 Units by mouth every 7 (seven) days.          Diet and Activity recommendation: See Discharge Instructions above   Consults obtained - Dr Jule Ser - phone   Major procedures and Radiology  Reports - PLEASE review detailed and final reports for all details, in brief -       Ct Abdomen Pelvis Wo Contrast  08/03/2013   CLINICAL DATA:  Right-sided abdominal and flank pain.  EXAM: CT ABDOMEN AND PELVIS WITHOUT CONTRAST  TECHNIQUE: Multidetector CT imaging of the abdomen and pelvis was performed following the standard protocol without IV contrast.  COMPARISON:  Pelvis CT on 11/02/2006  FINDINGS: Images through the lung bases show small bilateral pleural effusions and bibasilar atelectasis. A moderate size hiatal hernia is seen containing both stomach and transverse colon. Cardiomegaly and diffuse coronary artery calcification noted.  Noncontrast images of the liver, pancreas, spleen, and adrenal glands are unremarkable in appearance. Small nonobstructive left lower pole renal calculi are seen as well as a small fluid attenuation renal cyst. A 1.9 cm subcapsular lesion is also seen in the posterior lower pole of the left kidney on image 36, which has higher than fluid attenuation but remains stable compared with previous pelvis CT in 2008. This is most consistent with a benign hemorrhagic or proteinaceous renal cyst. There is no evidence of hydronephrosis or ureteral calculi.  Calcified uterine fibroids are noted, largest measuring 5.5 cm. No definite lymphadenopathy identified within the abdomen or pelvis. Surgical clips seen from prior cholecystectomy. No evidence of acute inflammatory process or abnormal fluid collections. Large colonic stool burden noted, however there is no evidence of bowel obstruction.  Osteopenia and multiple lower thoracic and lumbar vertebral body compression fracture deformities are noted. No suspicious bone lesions identified. Fixation hardware noted in left hip.  IMPRESSION: Tiny nonobstructive left renal calculi. No evidence of ureteral calculi or hydronephrosis.  Calcified uterine fibroids, largest measuring 5.5 cm.  Large stool burden noted; suggest clinical correlation  for possible constipation.  Moderate size hiatal hernia containing stomach and transverse colon.  Multiple osteopenic lower thoracic and lumbar vertebral body compression fractures.  Small bilateral pleural effusions and bibasilar atelectasis.  Cardiomegaly and diffuse coronary artery calcification.   Electronically Signed   By: Myles Rosenthal M.D.   On: 08/03/2013 09:23   Dg Lumbar Spine Complete  08/01/2013   CLINICAL DATA:  Low back pain.  No known injury.  EXAM: LUMBAR SPINE - COMPLETE 4+ VIEW  COMPARISON:  None.  FINDINGS: There is a L4 compression fracture of uncertain age. There is also mild central depression of the L2 vertebral body. No destructive bony changes. The alignment is maintained. Moderate osteoporosis. Moderate multilevel facet disease but no definite pars defects. There are aortoiliac vascular calcifications but no definite aneurysm. Calcified uterine fibroids are noted.  IMPRESSION: L2 and L4 compression deformities of uncertain age.   Electronically Signed   By: Loralie Champagne M.D.   On:  08/01/2013 08:27   Dg Abd Portable 1v  08/07/2013   CLINICAL DATA:  Abdominal pain  EXAM: PORTABLE ABDOMEN - 1 VIEW  COMPARISON:  CT of the abdomen and pelvis of August 03, 2013.  FINDINGS: There is a moderate stool burden within the ascending and transverse portions of the colon. There are loops of mildly distended small bowel and there is distention of the stomach with gas. There is gas in the rectum. There are degenerative changes of the lumbar spine. There surgical clips in the gallbladder fossa. There are dense calcifications in the pelvis consistent with fibroids. The patient has undergone previous ORIF for a left femoral fracture.  IMPRESSION: The bowel gas pattern is nonspecific. There is a moderate colonic stool burden. No extraluminal gas collections are demonstrated.   Electronically Signed   By: David  [place]   On: 08/07/2013 10:22    Micro Results      No results found for this or any  previous visit (from the past 240 hour(s)).     Today   Subjective:   Elizabeth SarnaKathleen Deland today has no headache,no chest abdominal pain,no new weakness tingling or numbness, feels much better.  Objective:   Blood pressure 126/60, pulse 65, temperature 97.6 F (36.4 C), temperature source Oral, resp. rate 18, height 5\' 8"  (1.727 m), weight 50.6 kg (111 lb 8.8 oz), SpO2 96.00%.   Intake/Output Summary (Last 24 hours) at 08/08/13 1046 Last data filed at 08/08/13 0626  Gross per 24 hour  Intake    120 ml  Output      0 ml  Net    120 ml    Exam Awake Alert, Oriented x 3, No new F.N deficits, Normal affect [place].AT,PERRAL Supple Neck,No JVD, No cervical lymphadenopathy appriciated.  Symmetrical Chest wall movement, Good air movement bilaterally, CTAB RRR,No Gallops,Rubs or new Murmurs, No Parasternal Heave +ve B.Sounds, Abd Soft, Non tender, No organomegaly appriciated, No rebound -guarding or rigidity. No Cyanosis, Clubbing or edema, No new Rash or bruise  Data Review   CBC w Diff: Lab Results  Component Value Date   WBC 8.3 08/04/2013   HGB 11.0* 08/04/2013   HCT 34.2* 08/04/2013   PLT 183 08/04/2013   LYMPHOPCT 11* 08/03/2013   MONOPCT 10 08/03/2013   EOSPCT 2 08/03/2013   BASOPCT 0 08/03/2013    CMP: Lab Results  Component Value Date   NA 140 08/04/2013   K 4.2 08/05/2013   CL 99 08/04/2013   CO2 30 08/04/2013   BUN 13 08/04/2013   CREATININE 0.43* 08/04/2013   PROT 6.0 01/21/2013   ALBUMIN 3.3* 01/21/2013   BILITOT 1.0 01/21/2013   ALKPHOS 103 01/21/2013   AST 19 01/21/2013   ALT 21 01/21/2013  .   Total Time in preparing paper work, data evaluation and todays exam - 35 minutes  Elizabeth SeaSINGH,Sevrin Sally K M.D on 08/08/2013 at 10:46 AM  [facility] Office  [phone]   **Disclaimer: This note may have been dictated with voice recognition software. Similar sounding words can inadvertently be transcribed and this note may contain transcription errors which may not have been  corrected upon publication of note.**

## 2013-08-08 NOTE — Progress Notes (Addendum)
CSW visited pt room and informed her of dc. [facility]. [facility] to transport once [employer] Auth is received. DC packet has been placed in shadow chart and [facility] to be contacted today. RN made aware.  Auth# [id number]/ [facility] ambulance auth #[id number]  Theresia BoughNorma Tomasita Beevers, MSW, LCSW [phone]

## 2013-08-08 NOTE — Progress Notes (Signed)
Patient d/c to SNF.  IV removed.  SNF called, report called.

## 2013-08-08 NOTE — Progress Notes (Signed)
Physical Therapy Treatment Patient Details Name: Elizabeth CarpenterKathleen M [name] MRN: [id number] DOB: [birth year] Today's Date: 08/08/2013    History of Present Illness 80 y.o. female admitted with Multiple osteopenic lower thoracic and lumbar vertebral body compression fractures. Hx of HTN, LBBB, and CHF.    PT Comments    Patient making improvement with bed mobility and moving to standing today.  Agree with need for SNF at discharge for continued therapy.  Follow Up Recommendations  SNF     Equipment Recommendations  3in1 (PT)    Recommendations for Other Services       Precautions / Restrictions Precautions Precautions: Back Precaution Comments: Reviewed back precautions. Practiced don/doffing aspen brace. Required Braces or Orthoses: Spinal Brace Spinal Brace: Applied in sitting position;Lumbar corset Spinal Brace Comments: "On when out of bed" Restrictions Weight Bearing Restrictions: No    Mobility  Bed Mobility Overal bed mobility: Needs Assistance Bed Mobility: Rolling;Sidelying to Sit Rolling: Min guard Sidelying to sit: Min assist       General bed mobility comments: Verbal cues for technique.  Patient using rail for rolling.  Assist to bring trunk to sitting position.  Reviewed donning brace.  Transfers Overall transfer level: Needs assistance Equipment used: Rolling walker (2 wheeled) Transfers: Sit to/from Stand Sit to Stand: Min assist         General transfer comment: Verbal cues for hand placement.  Assist to move to standing and for balance.  Ambulation/Gait Ambulation/Gait assistance: Min assist Ambulation Distance (Feet): 5 Feet Assistive device: Rolling walker (2 wheeled) Gait Pattern/deviations: Step-through pattern;Decreased stride length;Shuffle;Trunk flexed Gait velocity: Decreased Gait velocity interpretation: Below normal speed for age/gender General Gait Details: Verbal cues for safe use of RW.  Initiated ambulation.  After 5', patient states she  wants to sit down.  Declined to ambulate further.  Chair to patient and assisted to sitting.   Stairs            Wheelchair Mobility    Modified Rankin (Stroke Patients Only)       Balance                                    Cognition Arousal/Alertness: Awake/alert Behavior During Therapy: WFL for tasks assessed/performed Overall Cognitive Status: No family/caregiver present to determine baseline cognitive functioning Area of Impairment: Memory     Memory: Decreased short-term memory;Decreased recall of precautions              Exercises General Exercises - Lower Extremity Ankle Circles/Pumps: AROM;Both;10 reps;Seated    General Comments General comments (skin integrity, edema, etc.): Continues to c/o brace making it difficult for her to breathe.      Pertinent Vitals/Pain     Home Living                      Prior Function            PT Goals (current goals can now be found in the care plan section) Progress towards PT goals: Progressing toward goals    Frequency  Min 3X/week    PT Plan Current plan remains appropriate    Co-evaluation             End of Session Equipment Utilized During Treatment: Gait belt;Back brace Activity Tolerance: Patient limited by fatigue Patient left: in chair;with call bell/phone within reach;with nursing/sitter in room     Time: [id number] PT Time  Calculation (min): 12 min  Charges:  $Therapeutic Activity: 8-22 mins                    G Codes:      Vena AustriaDavis, Welden Hausmann H 08/08/2013, 11:18 AM Durenda HurtSusan H. Renaldo Fiddleravis, PT, [facility] Acute [facility] Pager [phone]

## 2013-08-08 NOTE — Discharge Instructions (Signed)
Follow with Primary MD Aida PufferLITTLE,JAMES, MD in 7 days   Get CBC, CMP  checked  by Primary MD or SNF M.D. next visit.    Activity: As tolerated with Full fall precautions use walker/cane & assistance as needed   Disposition Home     Diet: Heart Healthy with feeding assistance and aspiration precautions .  For Heart failure patients - Check your Weight same time everyday, if you gain over 2 pounds, or you develop in leg swelling, experience more shortness of breath or chest pain, call your Primary MD immediately. Follow Cardiac Low Salt Diet and 1.8 lit/day fluid restriction.   On your next visit with her primary care physician please Get Medicines reviewed and adjusted.  Please request your Prim.MD to go over all Hospital Tests and Procedure/Radiological results at the follow up, please get all Hospital records sent to your Prim MD by signing hospital release before you go home.   If you experience worsening of your admission symptoms, develop shortness of breath, life threatening emergency, suicidal or homicidal thoughts you must seek medical attention immediately by calling 911 or calling your MD immediately  if symptoms less severe.  You Must read complete instructions/literature along with all the possible adverse reactions/side effects for all the Medicines you take and that have been prescribed to you. Take any new Medicines after you have completely understood and accpet all the possible adverse reactions/side effects.   Do not drive, operating heavy machinery, perform activities at heights, swimming or participation in water activities or provide baby sitting services if your were admitted for syncope or siezures until you have seen by Primary MD or a Neurologist and advised to do so again.  Do not drive when taking Pain medications.    Do not take more than prescribed Pain, Sleep and Anxiety Medications  Special Instructions: If you have smoked or chewed Tobacco  in the last 2 yrs  please stop smoking, stop any regular Alcohol  and or any Recreational drug use.  Wear Seat belts while driving.   Please note  You were cared for by a hospitalist during your hospital stay. If you have any questions about your discharge medications or the care you received while you were in the hospital after you are discharged, you can call the unit and asked to speak with the hospitalist on call if the hospitalist that took care of you is not available. Once you are discharged, your primary care physician will handle any further medical issues. Please note that NO REFILLS for any discharge medications will be authorized once you are discharged, as it is imperative that you return to your primary care physician (or establish a relationship with a primary care physician if you do not have one) for your aftercare needs so that they can reassess your need for medications and monitor your lab values.

## 2013-08-10 ENCOUNTER — Non-Acute Institutional Stay (SKILLED_NURSING_FACILITY): Payer: Medicare Other | Admitting: Internal Medicine

## 2013-08-10 DIAGNOSIS — E039 Hypothyroidism, unspecified: Secondary | ICD-10-CM

## 2013-08-10 DIAGNOSIS — IMO0002 Reserved for concepts with insufficient information to code with codable children: Secondary | ICD-10-CM

## 2013-08-10 DIAGNOSIS — I509 Heart failure, unspecified: Secondary | ICD-10-CM

## 2013-08-10 DIAGNOSIS — R350 Frequency of micturition: Secondary | ICD-10-CM

## 2013-08-10 DIAGNOSIS — M4856XS Collapsed vertebra, not elsewhere classified, lumbar region, sequela of fracture: Secondary | ICD-10-CM

## 2013-08-12 NOTE — Progress Notes (Signed)
HISTORY & PHYSICAL  DATE: 08/10/2013   FACILITY: [facility]  LEVEL OF CARE: SNF (31)  ALLERGIES:  No Known Allergies  CHIEF COMPLAINT:  Manage lumbar compression fracture, hypothyroidism and CHF  HISTORY OF PRESENT ILLNESS: 80 year old female was hospitalized with back pain. After hospitalization she is admitted to this facility for short-term rehabilitation.  COMPRESSION FX: The patient had sustained a lumbar spine compression fracture. Neurosurgery recommended aspirin back brace. Patient is admitted to this facility for short-term rehabilitation. Patient denies back pain currently. No complications reported from the pain medications currently being used.  CHF:The patient does not relate significant weight changes, denies sob, DOE, orthopnea, PNDs, pedal edema, palpitations or chest pain.  CHF remains stable.  No complications form the medications being used. EF 25%.  HYPOTHYROIDISM: The hypothyroidism remains stable. No complications noted from the medications presently being used.  The patient denies fatigue or constipation.  Last TSH is 4.28.  PAST MEDICAL HISTORY :  Past Medical History  Diagnosis Date  . Thyroid disease   . Hypertension   . Abnormality of gait 04/21/2008    Qualifier: Diagnosis of  By: Sheffield Slider MD, Deniece Portela    . ALCOHOL ABUSE 04/21/2008    Qualifier: Diagnosis of  By: Sheffield Slider MD, Deniece Portela    . ANEMIA, SECONDARY TO ACUTE BLOOD LOSS 04/21/2008    Qualifier: Diagnosis of  By: Sheffield Slider MD, Deniece Portela    . DEPRESSION/ANXIETY 04/21/2008    Qualifier: Diagnosis of  By: Sheffield Slider MD, Deniece Portela    . Edema 05/23/2008    Qualifier: Diagnosis of  By: Eden Emms, MD, Harrington Challenger   . LOSS OF APPETITE 04/21/2008    Qualifier: Diagnosis of  By: Sheffield Slider MD, Deniece Portela    . UTI 04/21/2008    Qualifier: Diagnosis of  By: Sheffield Slider MD, Deniece Portela    . CLOSED FRACTURE INTERTROCHANTERIC SECTION FEMUR 04/21/2008    Qualifier: Diagnosis of  By: Sheffield Slider MD, Deniece Portela    . OSTEOARTHRITIS, KNEE, LEFT,  MILD 04/21/2008    Qualifier: Diagnosis of  By: Sheffield Slider MD, Deniece Portela    . OSTEOPOROSIS 04/21/2008    Qualifier: Diagnosis of  By: Sheffield Slider MD, Deniece Portela    . HYPOTHYROIDISM 04/21/2008    Qualifier: Diagnosis of  By: Sheffield Slider MD, Deniece Portela    . GALLBLADDER DISEASE 05/22/2008    Qualifier: Diagnosis of  By: Kem Parkinson    . Acute CHF (congestive heart failure) 01/21/2013  . Essential hypertension, benign 04/21/2008    Qualifier: Diagnosis of  By: Sheffield Slider MD, Deniece Portela    . LBBB 05/23/2008    Qualifier: Diagnosis of  By: Eden Emms, MD, Harrington Challenger     PAST SURGICAL HISTORY: Past Surgical History  Procedure Laterality Date  . Cholecystectomy    . Femur im nail  09/09/2011    Procedure: INTRAMEDULLARY (IM) NAIL FEMORAL;  Surgeon: Kathryne Hitch, MD;  Location: [facility] OR;  Service: Orthopedics;  Laterality: Left;  distal interlocking screws    SOCIAL HISTORY:  reports that she has never smoked. Her smokeless tobacco use includes Snuff. She reports that she drinks alcohol. She reports that she does not use illicit drugs.  FAMILY HISTORY: None  CURRENT MEDICATIONS: Reviewed per MAR/see medication list  REVIEW OF SYSTEMS:  GI: Complains of nausea, vomiting and abdominal pain., GU: Complains of urinary frequency ,See HPI otherwise 14 point ROS is negative.  PHYSICAL EXAMINATION  VS:  See VS section  GENERAL: no acute distress, thin body habitus EYES: conjunctivae  normal, sclerae normal, normal eye lids MOUTH/THROAT: lips without lesions,no lesions in the mouth,tongue is without lesions,uvula elevates in midline NECK: supple, trachea midline, no neck masses, no thyroid tenderness, no thyromegaly LYMPHATICS: no LAN in the neck, no supraclavicular LAN RESPIRATORY: breathing is even & unlabored, BS CTAB CARDIAC: RRR, no murmur,no extra heart sounds, no edema GI:  ABDOMEN: abdomen soft, no BS, no masses, no tenderness  LIVER/SPLEEN: no hepatomegaly, no splenomegaly MUSCULOSKELETAL: HEAD: normal to  inspection  EXTREMITIES: LEFT UPPER EXTREMITY: full range of motion, decreased strength & tone RIGHT UPPER EXTREMITY:  full range of motion, decreased strength & tone LEFT LOWER EXTREMITY:  Moderate range of motion, decreased strength & tone RIGHT LOWER EXTREMITY:  Moderate range of motion, decreased strength & tone PSYCHIATRIC: the patient is alert & oriented to person, affect & behavior appropriate  LABS/RADIOLOGY:  Labs reviewed: Basic Metabolic Panel:  Recent Labs  91/47/82 0455 08/03/13 0810 08/03/13 1520 08/04/13 0400 08/05/13 1510  NA 137 142  --  140  --   K 4.5 3.7  --  3.3* 4.2  CL 100 99  --  99  --   CO2 27 29  --  30  --   GLUCOSE 112* 116*  --  131*  --   BUN 17 12  --  13  --   CREATININE 0.78 0.50 0.43* 0.43*  --   CALCIUM 8.5 9.2  --  9.0  --   MG  --   --   --   --  2.0   Liver Function Tests:  Recent Labs  01/21/13 0230  AST 19  ALT 21  ALKPHOS 103  BILITOT 1.0  PROT 6.0  ALBUMIN 3.3*   CBC:  Recent Labs  01/20/13 2021  08/03/13 0810 08/03/13 1520 08/04/13 0400  WBC 7.7  < > 7.5 6.9 8.3  NEUTROABS 6.0  --  5.7  --   --   HGB 12.4  < > 13.2 12.1 11.0*  HCT 37.9  < > 40.6 37.2 34.2*  MCV 95.9  < > 97.4 97.1 97.4  PLT PLATELET CLUMPS NOTED ON SMEAR, COUNT APPEARS ADEQUATE  < > 197 197 183  < > = values in this interval not displayed.  Cardiac Enzymes:  Recent Labs  01/21/13 0230 01/21/13 0902 01/21/13 1240  TROPONINI <0.30 <0.30 <0.30    LUMBAR SPINE - COMPLETE 4+ VIEW   COMPARISON:  None.   FINDINGS: There is a L4 compression fracture of uncertain age. There is also mild central depression of the L2 vertebral body. No destructive bony changes. The alignment is maintained. Moderate osteoporosis. Moderate multilevel facet disease but no definite pars defects. There are aortoiliac vascular calcifications but no definite aneurysm. Calcified uterine fibroids are noted.   IMPRESSION: L2 and L4 compression deformities of  uncertain age.   CT ABDOMEN AND PELVIS WITHOUT CONTRAST   TECHNIQUE: Multidetector CT imaging of the abdomen and pelvis was performed following the standard protocol without IV contrast.   COMPARISON:  Pelvis CT on 11/02/2006   FINDINGS: Images through the lung bases show small bilateral pleural effusions and bibasilar atelectasis. A moderate size hiatal hernia is seen containing both stomach and transverse colon. Cardiomegaly and diffuse coronary artery calcification noted.   Noncontrast images of the liver, pancreas, spleen, and adrenal glands are unremarkable in appearance. Small nonobstructive left lower pole renal calculi are seen as well as a small fluid attenuation renal cyst. A 1.9 cm subcapsular lesion is also seen in the  posterior lower pole of the left kidney on image 36, which has higher than fluid attenuation but remains stable compared with previous pelvis CT in 2008. This is most consistent with a benign hemorrhagic or proteinaceous renal cyst. There is no evidence of hydronephrosis or ureteral calculi.   Calcified uterine fibroids are noted, largest measuring 5.5 cm. No definite lymphadenopathy identified within the abdomen or pelvis. Surgical clips seen from prior cholecystectomy. No evidence of acute inflammatory process or abnormal fluid collections. Large colonic stool burden noted, however there is no evidence of bowel obstruction.   Osteopenia and multiple lower thoracic and lumbar vertebral body compression fracture deformities are noted. No suspicious bone lesions identified. Fixation hardware noted in left hip.   IMPRESSION: Tiny nonobstructive left renal calculi. No evidence of ureteral calculi or hydronephrosis.   Calcified uterine fibroids, largest measuring 5.5 cm.   Large stool burden noted; suggest clinical correlation for possible constipation.   Moderate size hiatal hernia containing stomach and transverse colon.   Multiple osteopenic  lower thoracic and lumbar vertebral body compression fractures.   Small bilateral pleural effusions and bibasilar atelectasis.   Cardiomegaly and diffuse coronary artery calcification.     PORTABLE ABDOMEN - 1 VIEW   COMPARISON:  CT of the abdomen and pelvis of August 03, 2013.   FINDINGS: There is a moderate stool burden within the ascending and transverse portions of the colon. There are loops of mildly distended small bowel and there is distention of the stomach with gas. There is gas in the rectum. There are degenerative changes of the lumbar spine. There surgical clips in the gallbladder fossa. There are dense calcifications in the pelvis consistent with fibroids. The patient has undergone previous ORIF for a left femoral fracture.   IMPRESSION: The bowel gas pattern is nonspecific. There is a moderate colonic stool burden. No extraluminal gas collections are demonstrated.    ASSESSMENT/PLAN:  LS spine compression fracture-continue back brace and rehabilitation. CHF-compensated Hypothyroidism-well-controlled Urinary frequency-new problem. Check UA culture and sensitivities. Nausea, vomiting and abdominal pain-exam benign. Has a history of pancreatic insufficiency. Continue supportive care. Clearly liquid diet and advance as tolerated. Use Phenergan when necessary Constipation-continue laxatives Hypokalemia-continue supplementation. Recheck Check CBC differential and BMP  I have reviewed patient's medical records received at admission/from hospitalization.  CPT CODE: [id number]  Angela CoxGayani Y Giuseppe Duchemin, MD [facility] [phone]

## 2013-08-25 ENCOUNTER — Encounter: Payer: Self-pay | Admitting: Adult Health

## 2013-08-25 ENCOUNTER — Non-Acute Institutional Stay (SKILLED_NURSING_FACILITY): Payer: Medicare Other | Admitting: Adult Health

## 2013-08-25 DIAGNOSIS — E039 Hypothyroidism, unspecified: Secondary | ICD-10-CM

## 2013-08-25 DIAGNOSIS — F411 Generalized anxiety disorder: Secondary | ICD-10-CM

## 2013-08-25 DIAGNOSIS — K8689 Other specified diseases of pancreas: Secondary | ICD-10-CM

## 2013-08-25 DIAGNOSIS — S32009D Unspecified fracture of unspecified lumbar vertebra, subsequent encounter for fracture with routine healing: Secondary | ICD-10-CM

## 2013-08-25 DIAGNOSIS — F419 Anxiety disorder, unspecified: Secondary | ICD-10-CM | POA: Insufficient documentation

## 2013-08-25 DIAGNOSIS — I10 Essential (primary) hypertension: Secondary | ICD-10-CM

## 2013-08-25 DIAGNOSIS — IMO0002 Reserved for concepts with insufficient information to code with codable children: Secondary | ICD-10-CM

## 2013-08-25 DIAGNOSIS — M810 Age-related osteoporosis without current pathological fracture: Secondary | ICD-10-CM

## 2013-08-25 DIAGNOSIS — I5022 Chronic systolic (congestive) heart failure: Secondary | ICD-10-CM

## 2013-08-25 NOTE — Progress Notes (Signed)
Patient ID: Elizabeth [name], female   DOB: 1923/06/12, 79 y.o.   MRN: [id number]              PROGRESS NOTE  DATE: 08/25/2013   FACILITY: [facility]  LEVEL OF CARE: SNF (31)  Acute Visit  CHIEF COMPLAINT:  Discharge Notes  HISTORY OF PRESENT ILLNESS: This is an 80 year old female who is for discharge home with Home health PT, OT, Nursing and CNA. DME: Oxygen. She has been admitted to [facility] on 08/08/13 from [facility] with Compression fracture of lumbar vertebra. Patient was admitted to this facility for short-term rehabilitation after the patient's recent hospitalization.  Patient has completed SNF rehabilitation and therapy has cleared the patient for discharge.  Reassessment of ongoing problem(s):  HTN: Pt 's HTN remains stable.  Denies CP, sob, DOE, pedal edema, headaches, dizziness or visual disturbances.  No complications from the medications currently being used.  Last BP : 137/86  CHF:The patient does not relate significant weight changes, denies sob, DOE, orthopnea, PNDs, pedal edema, palpitations or chest pain.  CHF remains stable.  No complications form the medications being used.  OSTEOPOROSIS: The patient's osteoporosis remains stable. The patient denies recent worsening of pain and the range of motion remains stable. There has not been any evidence of fractures. No complications noted from the medications presently being used.   PAST MEDICAL HISTORY : Reviewed.  No changes/see problem list  CURRENT MEDICATIONS: Reviewed per MAR/see medication list  REVIEW OF SYSTEMS:  GENERAL: no change in appetite, no fatigue, no weight changes, no fever, chills or weakness RESPIRATORY: no cough, SOB, DOE, wheezing, hemoptysis CARDIAC: no chest pain, edema or palpitations GI: no abdominal pain, diarrhea, constipation, heart burn, nausea or vomiting  PHYSICAL EXAMINATION  GENERAL: no acute distress, normal body habitus EYES: conjunctivae normal,  sclerae normal, normal eye lids NECK: supple, trachea midline, no neck masses, no thyroid tenderness, no thyromegaly LYMPHATICS: no LAN in the neck, no supraclavicular LAN RESPIRATORY: breathing is even & unlabored, BS CTAB CARDIAC: RRR, no murmur,no extra heart sounds, no edema GI: abdomen soft, normal BS, no masses, no tenderness, no hepatomegaly, no splenomegaly EXTREMITIES:  Able to move all 4 extremities PSYCHIATRIC: the patient is alert & oriented to person, affect & behavior appropriate  LABS/RADIOLOGY: Labs reviewed: Basic Metabolic Panel:  Recent Labs  40/98/11 0455 08/03/13 0810 08/03/13 1520 08/04/13 0400 08/05/13 1510  NA 137 142  --  140  --   K 4.5 3.7  --  3.3* 4.2  CL 100 99  --  99  --   CO2 27 29  --  30  --   GLUCOSE 112* 116*  --  131*  --   BUN 17 12  --  13  --   CREATININE 0.78 0.50 0.43* 0.43*  --   CALCIUM 8.5 9.2  --  9.0  --   MG  --   --   --   --  2.0   Liver Function Tests:  Recent Labs  01/21/13 0230  AST 19  ALT 21  ALKPHOS 103  BILITOT 1.0  PROT 6.0  ALBUMIN 3.3*   CBC:  Recent Labs  01/20/13 2021  08/03/13 0810 08/03/13 1520 08/04/13 0400  WBC 7.7  < > 7.5 6.9 8.3  NEUTROABS 6.0  --  5.7  --   --   HGB 12.4  < > 13.2 12.1 11.0*  HCT 37.9  < > 40.6 37.2 34.2*  MCV 95.9  < > 97.4 97.1 97.4  PLT PLATELET CLUMPS NOTED ON SMEAR, COUNT APPEARS ADEQUATE  < > 197 197 183  < > = values in this interval not displayed.  Cardiac Enzymes:  Recent Labs  01/21/13 0230 01/21/13 0902 01/21/13 1240  TROPONINI <0.30 <0.30 <0.30    EXAM: LUMBAR SPINE - COMPLETE 4+ VIEW   COMPARISON:  None.   FINDINGS: There is a L4 compression fracture of uncertain age. There is also mild central depression of the L2 vertebral body. No destructive bony changes. The alignment is maintained. Moderate osteoporosis. Moderate multilevel facet disease but no definite pars defects. There are aortoiliac vascular calcifications but no  definite aneurysm. Calcified uterine fibroids are noted.   IMPRESSION: L2 and L4 compression deformities of uncertain age. EXAM: CT ABDOMEN AND PELVIS WITHOUT CONTRAST   TECHNIQUE: Multidetector CT imaging of the abdomen and pelvis was performed following the standard protocol without IV contrast.   COMPARISON:  Pelvis CT on 11/02/2006   FINDINGS: Images through the lung bases show small bilateral pleural effusions and bibasilar atelectasis. A moderate size hiatal hernia is seen containing both stomach and transverse colon. Cardiomegaly and diffuse coronary artery calcification noted.   Noncontrast images of the liver, pancreas, spleen, and adrenal glands are unremarkable in appearance. Small nonobstructive left lower pole renal calculi are seen as well as a small fluid attenuation renal cyst. A 1.9 cm subcapsular lesion is also seen in the posterior lower pole of the left kidney on image 36, which has higher than fluid attenuation but remains stable compared with previous pelvis CT in 2008. This is most consistent with a benign hemorrhagic or proteinaceous renal cyst. There is no evidence of hydronephrosis or ureteral calculi.   Calcified uterine fibroids are noted, largest measuring 5.5 cm. No definite lymphadenopathy identified within the abdomen or pelvis. Surgical clips seen from prior cholecystectomy. No evidence of acute inflammatory process or abnormal fluid collections. Large colonic stool burden noted, however there is no evidence of bowel obstruction.   Osteopenia and multiple lower thoracic and lumbar vertebral body compression fracture deformities are noted. No suspicious bone lesions identified. Fixation hardware noted in left hip.   IMPRESSION: Tiny nonobstructive left renal calculi. No evidence of ureteral calculi or hydronephrosis.   Calcified uterine fibroids, largest measuring 5.5 cm.   Large stool burden noted; suggest clinical correlation for  possible constipation.   Moderate size hiatal hernia containing stomach and transverse colon.   Multiple osteopenic lower thoracic and lumbar vertebral body compression fractures.   Small bilateral pleural effusions and bibasilar atelectasis.   Cardiomegaly and diffuse coronary artery calcification. EXAM: PORTABLE ABDOMEN - 1 VIEW   COMPARISON:  CT of the abdomen and pelvis of August 03, 2013.   FINDINGS: There is a moderate stool burden within the ascending and transverse portions of the colon. There are loops of mildly distended small bowel and there is distention of the stomach with gas. There is gas in the rectum. There are degenerative changes of the lumbar spine. There surgical clips in the gallbladder fossa. There are dense calcifications in the pelvis consistent with fibroids. The patient has undergone previous ORIF for a left femoral fracture.   IMPRESSION: The bowel gas pattern is nonspecific. There is a moderate colonic stool burden. No extraluminal gas collections are demonstrated.     ASSESSMENT/PLAN:  Compression fracture of lumbar vertebra - continue aspen back brace; for home health PT, OT, nursing and CNA; continue Duragesic patch Hypertension - well controlled;  continue metoprolol Hypothyroidism - stable; continue Synthroid Osteoporosis - continue vitamin D Pancreatic insufficiency - continue Creon with meals and snacks Systolic CHF - stable; continue Lasix Anxiety - continue Ativan    I have filled out patient's discharge paperwork and written prescriptions.  Patient will receive home health PT, OT, Nursing and CNA.  DME provided: Oxygen @ 2L/min via [facility] [employer]  Total discharge time: Greater than 30 minutes  Discharge time involved coordination of the discharge process with social worker, nursing staff and therapy department. Medical justification for home health services/DME verified.  CPT CODE: 44010  Ella Bodo - NP [facility] [phone]

## 2013-10-06 ENCOUNTER — Other Ambulatory Visit: Payer: Self-pay | Admitting: Internal Medicine

## 2013-11-29 ENCOUNTER — Other Ambulatory Visit: Payer: Self-pay | Admitting: Adult Health

## 2013-12-08 ENCOUNTER — Encounter (HOSPITAL_COMMUNITY): Payer: Self-pay | Admitting: Emergency Medicine

## 2013-12-08 ENCOUNTER — Inpatient Hospital Stay (HOSPITAL_COMMUNITY)
Admission: EM | Admit: 2013-12-08 | Discharge: 2013-12-14 | DRG: 291 | Disposition: A | Discharge status: Skilled Nursing Facility | Payer: Medicare Other | Attending: Internal Medicine | Admitting: Internal Medicine

## 2013-12-08 ENCOUNTER — Emergency Department (HOSPITAL_COMMUNITY): Payer: Medicare Other

## 2013-12-08 DIAGNOSIS — R140 Abdominal distension (gaseous): Secondary | ICD-10-CM

## 2013-12-08 DIAGNOSIS — F418 Other specified anxiety disorders: Secondary | ICD-10-CM | POA: Diagnosis present

## 2013-12-08 DIAGNOSIS — Z681 Body mass index (BMI) 19 or less, adult: Secondary | ICD-10-CM

## 2013-12-08 DIAGNOSIS — J9621 Acute and chronic respiratory failure with hypoxia: Secondary | ICD-10-CM | POA: Diagnosis present

## 2013-12-08 DIAGNOSIS — F17220 Nicotine dependence, chewing tobacco, uncomplicated: Secondary | ICD-10-CM | POA: Diagnosis present

## 2013-12-08 DIAGNOSIS — Z9981 Dependence on supplemental oxygen: Secondary | ICD-10-CM | POA: Diagnosis not present

## 2013-12-08 DIAGNOSIS — Z79899 Other long term (current) drug therapy: Secondary | ICD-10-CM

## 2013-12-08 DIAGNOSIS — Z791 Long term (current) use of non-steroidal anti-inflammatories (NSAID): Secondary | ICD-10-CM | POA: Diagnosis not present

## 2013-12-08 DIAGNOSIS — I10 Essential (primary) hypertension: Secondary | ICD-10-CM | POA: Diagnosis present

## 2013-12-08 DIAGNOSIS — M810 Age-related osteoporosis without current pathological fracture: Secondary | ICD-10-CM | POA: Diagnosis present

## 2013-12-08 DIAGNOSIS — I447 Left bundle-branch block, unspecified: Secondary | ICD-10-CM | POA: Diagnosis present

## 2013-12-08 DIAGNOSIS — I4891 Unspecified atrial fibrillation: Secondary | ICD-10-CM | POA: Diagnosis present

## 2013-12-08 DIAGNOSIS — I5023 Acute on chronic systolic (congestive) heart failure: Principal | ICD-10-CM | POA: Diagnosis present

## 2013-12-08 DIAGNOSIS — R0602 Shortness of breath: Secondary | ICD-10-CM | POA: Diagnosis present

## 2013-12-08 DIAGNOSIS — K219 Gastro-esophageal reflux disease without esophagitis: Secondary | ICD-10-CM | POA: Diagnosis present

## 2013-12-08 DIAGNOSIS — K567 Ileus, unspecified: Secondary | ICD-10-CM | POA: Diagnosis not present

## 2013-12-08 DIAGNOSIS — E039 Hypothyroidism, unspecified: Secondary | ICD-10-CM | POA: Diagnosis present

## 2013-12-08 DIAGNOSIS — I517 Cardiomegaly: Secondary | ICD-10-CM | POA: Diagnosis present

## 2013-12-08 DIAGNOSIS — Z79891 Long term (current) use of opiate analgesic: Secondary | ICD-10-CM

## 2013-12-08 DIAGNOSIS — F32A Depression, unspecified: Secondary | ICD-10-CM | POA: Diagnosis present

## 2013-12-08 DIAGNOSIS — E785 Hyperlipidemia, unspecified: Secondary | ICD-10-CM | POA: Diagnosis present

## 2013-12-08 DIAGNOSIS — R410 Disorientation, unspecified: Secondary | ICD-10-CM | POA: Diagnosis not present

## 2013-12-08 DIAGNOSIS — E875 Hyperkalemia: Secondary | ICD-10-CM | POA: Diagnosis not present

## 2013-12-08 DIAGNOSIS — I509 Heart failure, unspecified: Secondary | ICD-10-CM | POA: Insufficient documentation

## 2013-12-08 DIAGNOSIS — I429 Cardiomyopathy, unspecified: Secondary | ICD-10-CM | POA: Diagnosis present

## 2013-12-08 DIAGNOSIS — E876 Hypokalemia: Secondary | ICD-10-CM | POA: Diagnosis present

## 2013-12-08 DIAGNOSIS — Z66 Do not resuscitate: Secondary | ICD-10-CM | POA: Diagnosis present

## 2013-12-08 DIAGNOSIS — Z4659 Encounter for fitting and adjustment of other gastrointestinal appliance and device: Secondary | ICD-10-CM

## 2013-12-08 DIAGNOSIS — F329 Major depressive disorder, single episode, unspecified: Secondary | ICD-10-CM | POA: Diagnosis present

## 2013-12-08 HISTORY — DX: Hyperlipidemia, unspecified: E78.5

## 2013-12-08 LAB — CBC WITH DIFFERENTIAL/PLATELET
BASOS ABS: 0 10*3/uL (ref 0.0–0.1)
Basophils Relative: 0 % (ref 0–1)
EOS ABS: 0 10*3/uL (ref 0.0–0.7)
EOS PCT: 0 % (ref 0–5)
HCT: 40.1 % (ref 36.0–46.0)
Hemoglobin: 13.4 g/dL (ref 12.0–15.0)
Lymphocytes Relative: 12 % (ref 12–46)
Lymphs Abs: 1.2 10*3/uL (ref 0.7–4.0)
MCH: 32 pg (ref 26.0–34.0)
MCHC: 33.4 g/dL (ref 30.0–36.0)
MCV: 95.7 fL (ref 78.0–100.0)
Monocytes Absolute: 0.5 10*3/uL (ref 0.1–1.0)
Monocytes Relative: 5 % (ref 3–12)
Neutro Abs: 8.2 10*3/uL — ABNORMAL HIGH (ref 1.7–7.7)
Neutrophils Relative %: 83 % — ABNORMAL HIGH (ref 43–77)
PLATELETS: 140 10*3/uL — AB (ref 150–400)
RBC: 4.19 MIL/uL (ref 3.87–5.11)
RDW: 15.2 % (ref 11.5–15.5)
WBC: 9.9 10*3/uL (ref 4.0–10.5)

## 2013-12-08 LAB — URINE MICROSCOPIC-ADD ON

## 2013-12-08 LAB — I-STAT VENOUS BLOOD GAS, ED
ACID-BASE EXCESS: 2 mmol/L (ref 0.0–2.0)
Acid-Base Excess: 1 mmol/L (ref 0.0–2.0)
BICARBONATE: 29.1 meq/L — AB (ref 20.0–24.0)
Bicarbonate: 27.4 mEq/L — ABNORMAL HIGH (ref 20.0–24.0)
O2 Saturation: 42 %
O2 Saturation: 48 %
PH VEN: 7.332 — AB (ref 7.250–7.300)
TCO2: 29 mmol/L (ref 0–100)
TCO2: 31 mmol/L (ref 0–100)
pCO2, Ven: 51.8 mmHg — ABNORMAL HIGH (ref 45.0–50.0)
pCO2, Ven: 53 mmHg — ABNORMAL HIGH (ref 45.0–50.0)
pH, Ven: 7.348 — ABNORMAL HIGH (ref 7.250–7.300)
pO2, Ven: 26 mmHg — CL (ref 30.0–45.0)
pO2, Ven: 28 mmHg — CL (ref 30.0–45.0)

## 2013-12-08 LAB — BASIC METABOLIC PANEL
ANION GAP: 12 (ref 5–15)
BUN: 14 mg/dL (ref 6–23)
CALCIUM: 9.2 mg/dL (ref 8.4–10.5)
CO2: 27 mEq/L (ref 19–32)
CREATININE: 0.58 mg/dL (ref 0.50–1.10)
Chloride: 98 mEq/L (ref 96–112)
GFR calc Af Amer: >90 mL/min (ref >90)
GFR calc non Af Amer: 80 mL/min — ABNORMAL LOW (ref >90)
Glucose, Bld: 164 mg/dL — ABNORMAL HIGH (ref 70–99)
Potassium: 4 mEq/L (ref 3.7–5.3)
SODIUM: 137 meq/L (ref 137–147)

## 2013-12-08 LAB — URINALYSIS, ROUTINE W REFLEX MICROSCOPIC
Bilirubin Urine: NEGATIVE
Glucose, UA: NEGATIVE mg/dL
Ketones, ur: NEGATIVE mg/dL
LEUKOCYTES UA: NEGATIVE
Nitrite: NEGATIVE
PROTEIN: NEGATIVE mg/dL
Specific Gravity, Urine: 1.017 (ref 1.005–1.030)
UROBILINOGEN UA: 0.2 mg/dL (ref 0.0–1.0)
pH: 5 (ref 5.0–8.0)

## 2013-12-08 LAB — PRO B NATRIURETIC PEPTIDE: Pro B Natriuretic peptide (BNP): 25365 pg/mL — ABNORMAL HIGH (ref 0–450)

## 2013-12-08 LAB — TROPONIN I: Troponin I: <0.3 ng/mL (ref <0.30)

## 2013-12-08 MED ORDER — FUROSEMIDE 10 MG/ML IJ SOLN
40.0000 mg | Freq: Once | INTRAMUSCULAR | Status: AC
  Administered 2013-12-08: 40 mg via INTRAVENOUS
  Filled 2013-12-08: qty 4

## 2013-12-08 NOTE — Progress Notes (Signed)
   12/08/13 2019  BiPAP/CPAP/SIPAP  BiPAP/CPAP/SIPAP Pt Type Adult  Mask Type Full face mask  Mask Size Medium  Set Rate 12 breaths/min  Respiratory Rate 34 breaths/min  IPAP 10 cmH20  EPAP 5 cmH2O  Oxygen Percent 50 %  Minute Ventilation 18.5  Leak 2  Peak Inspiratory Pressure (PIP) 11  Tidal Volume (Vt) 520  BiPAP/CPAP/SIPAP BiPAP  Patient Home Equipment No  Auto Titrate No  Press High Alarm 25 cmH2O  Press Low Alarm 5 cmH2O  BiPAP/CPAP /SiPAP Vitals  Resp (!) 34  SpO2 100 %  Bilateral Breath Sounds Clear;Diminished  Received patient from EMS on CPAP, she was switch to our BIPAP V 60 on the above settings.

## 2013-12-08 NOTE — ED Notes (Signed)
Per Melony OverlyKuch, RN pt had called out to [facility] for assistance with going to the bathroom. Kuch informed this RN that he went to check on the pt who had urinated on herself.

## 2013-12-08 NOTE — ED Provider Notes (Signed)
CSN: [id number]     Arrival date & time 12/08/13  2007 History   First MD Initiated Contact with Patient 12/08/13 2008     Chief Complaint  Patient presents with  . Shortness of Breath     (Consider location/radiation/quality/duration/timing/severity/associated sxs/prior Treatment) Patient is a 80 y.o. female presenting with shortness of breath. The history is provided by the EMS personnel and a relative. No language interpreter was used.  Shortness of Breath Severity:  Severe Onset quality:  Gradual Duration:  1 day Timing:  Constant Progression:  Worsening Chronicity:  Recurrent Context: not URI   Context comment:  CHF Relieved by: CPAP. Worsened by:  Nothing tried Ineffective treatments:  Oxygen (although pt reportedly not wearing on EMS arrival) Associated symptoms: no abdominal pain, no chest pain, no cough, no diaphoresis, no fever, no headaches, no sputum production and no vomiting   Risk factors: no hx of cancer, no hx of PE/DVT, no prolonged immobilization, no recent surgery and no tobacco use     Past Medical History  Diagnosis Date  . Thyroid disease   . Hypertension   . Abnormality of gait 04/21/2008    Qualifier: Diagnosis of  By: Sheffield SliderHale MD, Deniece PortelaWayne    . ALCOHOL ABUSE 04/21/2008    Qualifier: Diagnosis of  By: Sheffield SliderHale MD, Deniece PortelaWayne    . ANEMIA, SECONDARY TO ACUTE BLOOD LOSS 04/21/2008    Qualifier: Diagnosis of  By: Sheffield SliderHale MD, Deniece PortelaWayne    . DEPRESSION/ANXIETY 04/21/2008    Qualifier: Diagnosis of  By: Sheffield SliderHale MD, Deniece PortelaWayne    . Edema 05/23/2008    Qualifier: Diagnosis of  By: Eden EmmsNishan, MD, Harrington ChallengerFACC, Peter Charles   . LOSS OF APPETITE 04/21/2008    Qualifier: Diagnosis of  By: Sheffield SliderHale MD, Deniece PortelaWayne    . UTI 04/21/2008    Qualifier: Diagnosis of  By: Sheffield SliderHale MD, Deniece PortelaWayne    . CLOSED FRACTURE INTERTROCHANTERIC SECTION FEMUR 04/21/2008    Qualifier: Diagnosis of  By: Sheffield SliderHale MD, Deniece PortelaWayne    . OSTEOARTHRITIS, KNEE, LEFT, MILD 04/21/2008    Qualifier: Diagnosis of  By: Sheffield SliderHale MD, Deniece PortelaWayne    . OSTEOPOROSIS 04/21/2008   Qualifier: Diagnosis of  By: Sheffield SliderHale MD, Deniece PortelaWayne    . HYPOTHYROIDISM 04/21/2008    Qualifier: Diagnosis of  By: Sheffield SliderHale MD, Deniece PortelaWayne    . GALLBLADDER DISEASE 05/22/2008    Qualifier: Diagnosis of  By: Kem ParkinsonBarnes, Kimalexis    . Acute CHF (congestive heart failure) 01/21/2013  . Essential hypertension, benign 04/21/2008    Qualifier: Diagnosis of  By: Sheffield SliderHale MD, Deniece PortelaWayne    . LBBB 05/23/2008    Qualifier: Diagnosis of  By: Eden EmmsNishan, MD, Harrington ChallengerFACC, Peter Charles    Past Surgical History  Procedure Laterality Date  . Cholecystectomy    . Femur im nail  09/09/2011    Procedure: INTRAMEDULLARY (IM) NAIL FEMORAL;  Surgeon: Kathryne Hitchhristopher Y Blackman, MD;  Location: [facility] OR;  Service: Orthopedics;  Laterality: Left;  distal interlocking screws   History reviewed. No pertinent family history. History  Substance Use Topics  . Smoking status: Never Smoker   . Smokeless tobacco: Current User    Types: Snuff  . Alcohol Use: No   OB History    No data available     Review of Systems  Constitutional: Negative for fever and diaphoresis.  Respiratory: Positive for shortness of breath. Negative for cough and sputum production.   Cardiovascular: Negative for chest pain and palpitations.  Gastrointestinal: Negative for nausea, vomiting and abdominal pain.  Musculoskeletal: Negative for  back pain.  Neurological: Negative for headaches.  Psychiatric/Behavioral: Positive for hallucinations (per family).  All other systems reviewed and are negative.     Allergies  Review of patient's allergies indicates no known allergies.  Home Medications   Prior to Admission medications   Medication Sig Start Date End Date Taking? Authorizing Provider  calcium-vitamin D (OSCAL WITH D) 500-200 MG-UNIT per tablet Take 2 tablets by mouth 2 (two) times daily. 08/08/13  Yes Leroy SeaPrashant K Singh, MD  Cyanocobalamin (VITAMIN B-12 PO) Take 1 tablet by mouth daily.   Yes Historical Provider, MD  furosemide (LASIX) 20 MG tablet Take 20 mg by mouth 2 (two)  times daily.    Yes Historical Provider, MD  ibuprofen (ADVIL,MOTRIN) 200 MG tablet Take 800 mg by mouth every 8 (eight) hours as needed (pain).   Yes Historical Provider, MD  levothyroxine (SYNTHROID, LEVOTHROID) 100 MCG tablet Take 100 mcg by mouth daily before breakfast.   Yes Historical Provider, MD  lipase/protease/amylase (CREON-12/PANCREASE) 12000 UNITS CPEP capsule Take 2 capsules by mouth 3 (three) times daily before meals. And take 4098112000 units with snacks 03/17/13  Yes Sharee Holstereborah S Green, NP  lisinopril (PRINIVIL,ZESTRIL) 2.5 MG tablet Take 2.5 mg by mouth daily.   Yes Historical Provider, MD  LORazepam (ATIVAN) 0.5 MG tablet TAKE ONE-HALF TABLET BY MOUTH TWICE DAILY 10/06/13  Yes Tiffany L Reed, DO  metoprolol tartrate (LOPRESSOR) 12.5 mg TABS tablet Take 0.5 tablets (12.5 mg total) by mouth 2 (two) times daily. 01/25/13  Yes Esperanza SheetsUlugbek N Buriev, MD  potassium chloride (K-DUR,KLOR-CON) 10 MEQ tablet Take 10 mEq by mouth daily.   Yes Historical Provider, MD  promethazine (PHENERGAN) 25 MG tablet Take 25 mg by mouth every 6 (six) hours as needed for nausea or vomiting.   Yes Historical Provider, MD  traMADol (ULTRAM) 50 MG tablet Take 1 tablet (50 mg total) by mouth every 6 (six) hours as needed for moderate pain. 08/08/13  Yes Leroy SeaPrashant K Singh, MD  Vitamin D, Ergocalciferol, (DRISDOL) 50000 UNITS CAPS capsule Take 50,000 Units by mouth every 7 (seven) days.   Yes Historical Provider, MD  bisacodyl (DULCOLAX) 10 MG suppository Place 1 suppository (10 mg total) rectally daily. Patient not taking: Reported on 12/08/2013 08/08/13   Leroy SeaPrashant K Singh, MD  docusate sodium 100 MG CAPS Take 200 mg by mouth 2 (two) times daily. Patient not taking: Reported on 12/08/2013 08/08/13   Leroy SeaPrashant K Singh, MD  fentaNYL (DURAGESIC - DOSED MCG/HR) 12 MCG/HR Place 1 patch (12.5 mcg total) onto the skin every 3 (three) days. Patient not taking: Reported on 12/08/2013 08/08/13   Leroy SeaPrashant K Singh, MD  polyethylene glycol (MIRALAX /  Ethelene HalGLYCOLAX) packet Take 17 g by mouth 2 (two) times daily. Patient not taking: Reported on 12/08/2013 08/08/13   Leroy SeaPrashant K Singh, MD   BP 118/61 mmHg  Pulse 70  Temp(Src) 97.9 F (36.6 C) (Oral)  Resp 17  Ht 5\' 3"  (1.6 m)  Wt 106 lb (48.081 kg)  BMI 18.78 kg/m2  SpO2 98% Physical Exam  Constitutional: She is oriented to person, place, and time.  Elderly, frail  Eyes: Pupils are equal, round, and reactive to light.  Neck: JVD present. No tracheal deviation present.  Cardiovascular: Normal rate and intact distal pulses.  An irregularly irregular rhythm present. Exam reveals no gallop.   No murmur heard. Pulmonary/Chest: No accessory muscle usage. Tachypnea noted. She has rales.  Abdominal: Soft. She exhibits distension. There is no tenderness.  Musculoskeletal: She exhibits edema (  trace).  Neurological: She is alert and oriented to person, place, and time.  Skin: Skin is warm.  Vitals reviewed.   ED Course  Procedures (including critical care time) Labs Review Labs Reviewed  BASIC METABOLIC PANEL - Abnormal; Notable for the following:    Glucose, Bld 164 (*)    GFR calc non Af Amer 80 (*)    All other components within normal limits  PRO B NATRIURETIC PEPTIDE - Abnormal; Notable for the following:    Pro B Natriuretic peptide (BNP) 25365.0 (*)    All other components within normal limits  CBC WITH DIFFERENTIAL - Abnormal; Notable for the following:    Platelets 140 (*)    Neutrophils Relative % 83 (*)    Neutro Abs 8.2 (*)    All other components within normal limits  URINALYSIS, ROUTINE W REFLEX MICROSCOPIC - Abnormal; Notable for the following:    Hgb urine dipstick MODERATE (*)    All other components within normal limits  I-STAT VENOUS BLOOD GAS, ED - Abnormal; Notable for the following:    pH, Ven 7.332 (*)    pCO2, Ven 51.8 (*)    pO2, Ven 26.0 (*)    Bicarbonate 27.4 (*)    All other components within normal limits  I-STAT VENOUS BLOOD GAS, ED - Abnormal; Notable  for the following:    pH, Ven 7.348 (*)    pCO2, Ven 53.0 (*)    pO2, Ven 28.0 (*)    Bicarbonate 29.1 (*)    All other components within normal limits  TROPONIN I  URINE MICROSCOPIC-ADD ON    Imaging Review Dg Chest Port 1 View  12/08/2013   CLINICAL DATA:  Shortness of breath  EXAM: PORTABLE CHEST - 1 VIEW  COMPARISON:  01/20/2013  FINDINGS: There is chronic cardiomegaly. Enlargement of the hila, likely vascular congestion. Negative aortic contours.  Diffuse interstitial opacity with Kerley lines. Haziness of the lower chest consistent with either atelectasis and/or pleural fluid. There is a large gas-filled hiatal hernia.  No evidence of pneumothorax.  IMPRESSION: 1. Pulmonary edema, pleural effusions, and basilar atelectasis. These changes could obscure pneumonia. 2. Large hiatal hernia.   Electronically Signed   By: Tiburcio Pea M.D.   On: 12/08/2013 21:09     EKG Interpretation   Date/Time:  Thursday December 08 2013 20:13:22 EST Ventricular Rate:  104 PR Interval:    QRS Duration: 163 QT Interval:  431 QTC Calculation: 567 R Axis:   -15 Text Interpretation:  Atrial fibrillation Left bundle branch block  Artifact Confirmed by ZACKOWSKI  MD, SCOTT ([id number]) on 12/08/2013 9:25:42 PM      MDM   Final diagnoses:  SOB (shortness of breath)    80 y/o female with h/o CHF, HTN presenting with SOB, hypoxia. Volume overloaded. EKG with a fib, LBBB, no acute changes. WOB Improved with Bipap. CXR with pulmonary edema and effusions. Mentation stable. No recent infections or fever. Nephew reports independent with ADLs but also confirms DNR. Lasix IV given with 500 cc urine output. Will admit to Step down.     Abagail Kitchens, MD 12/09/13 1501

## 2013-12-08 NOTE — ED Notes (Signed)
Trinna Postlex (nephew) [phone]  Please call if you need anything.

## 2013-12-08 NOTE — ED Notes (Signed)
VBG result reported to Dr. Janelle Flooraylor/Zackowski

## 2013-12-08 NOTE — ED Notes (Signed)
Second VBG result shown to Dr. Etheleen Mayhewayolr/Zackowski

## 2013-12-08 NOTE — ED Notes (Signed)
Oxygen found on floor by ems, c/o shortness of breath, bilat wheezing and rales.  Placed on cpap by ems, given albuterol 5mg  neb.  Appears breathing easier on cpap.

## 2013-12-09 ENCOUNTER — Encounter (HOSPITAL_COMMUNITY): Payer: Self-pay | Admitting: Cardiology

## 2013-12-09 DIAGNOSIS — I059 Rheumatic mitral valve disease, unspecified: Secondary | ICD-10-CM

## 2013-12-09 DIAGNOSIS — I429 Cardiomyopathy, unspecified: Secondary | ICD-10-CM

## 2013-12-09 DIAGNOSIS — I5023 Acute on chronic systolic (congestive) heart failure: Secondary | ICD-10-CM | POA: Insufficient documentation

## 2013-12-09 DIAGNOSIS — J9601 Acute respiratory failure with hypoxia: Secondary | ICD-10-CM

## 2013-12-09 LAB — MRSA PCR SCREENING: MRSA by PCR: NEGATIVE

## 2013-12-09 LAB — CBC WITH DIFFERENTIAL/PLATELET
Basophils Absolute: 0 10*3/uL (ref 0.0–0.1)
Basophils Relative: 1 % (ref 0–1)
EOS ABS: 0 10*3/uL (ref 0.0–0.7)
EOS PCT: 1 % (ref 0–5)
HEMATOCRIT: 34.1 % — AB (ref 36.0–46.0)
HEMOGLOBIN: 11.2 g/dL — AB (ref 12.0–15.0)
LYMPHS ABS: 0.9 10*3/uL (ref 0.7–4.0)
LYMPHS PCT: 17 % (ref 12–46)
MCH: 31.3 pg (ref 26.0–34.0)
MCHC: 32.8 g/dL (ref 30.0–36.0)
MCV: 95.3 fL (ref 78.0–100.0)
MONO ABS: 0.4 10*3/uL (ref 0.1–1.0)
MONOS PCT: 8 % (ref 3–12)
Neutro Abs: 3.9 10*3/uL (ref 1.7–7.7)
Neutrophils Relative %: 73 % (ref 43–77)
Platelets: 129 10*3/uL — ABNORMAL LOW (ref 150–400)
RBC: 3.58 MIL/uL — AB (ref 3.87–5.11)
RDW: 15.1 % (ref 11.5–15.5)
WBC: 5.3 10*3/uL (ref 4.0–10.5)

## 2013-12-09 LAB — COMPREHENSIVE METABOLIC PANEL
ALT: 10 U/L (ref 0–35)
AST: 17 U/L (ref 0–37)
Albumin: 3 g/dL — ABNORMAL LOW (ref 3.5–5.2)
Alkaline Phosphatase: 86 U/L (ref 39–117)
Anion gap: 11 (ref 5–15)
BUN: 13 mg/dL (ref 6–23)
CALCIUM: 8.5 mg/dL (ref 8.4–10.5)
CO2: 28 meq/L (ref 19–32)
Chloride: 101 mEq/L (ref 96–112)
Creatinine, Ser: 0.51 mg/dL (ref 0.50–1.10)
GFR, EST NON AFRICAN AMERICAN: 83 mL/min — AB (ref >90)
GLUCOSE: 122 mg/dL — AB (ref 70–99)
Potassium: 3.3 mEq/L — ABNORMAL LOW (ref 3.7–5.3)
Sodium: 140 mEq/L (ref 137–147)
TOTAL PROTEIN: 5.3 g/dL — AB (ref 6.0–8.3)
Total Bilirubin: 0.5 mg/dL (ref 0.3–1.2)

## 2013-12-09 LAB — TROPONIN I
Troponin I: <0.3 ng/mL (ref <0.30)
Troponin I: <0.3 ng/mL (ref <0.30)

## 2013-12-09 LAB — PROTIME-INR
INR: 1.04 (ref 0.00–1.49)
Prothrombin Time: 13.7 seconds (ref 11.6–15.2)

## 2013-12-09 LAB — MAGNESIUM: Magnesium: 1.8 mg/dL (ref 1.5–2.5)

## 2013-12-09 MED ORDER — SODIUM CHLORIDE 0.9 % IJ SOLN
3.0000 mL | Freq: Two times a day (BID) | INTRAMUSCULAR | Status: DC
Start: 2013-12-09 — End: 2013-12-14
  Administered 2013-12-09 – 2013-12-10 (×5): 3 mL via INTRAVENOUS
  Administered 2013-12-11: 10 mL via INTRAVENOUS
  Administered 2013-12-11 – 2013-12-14 (×6): 3 mL via INTRAVENOUS

## 2013-12-09 MED ORDER — FUROSEMIDE 10 MG/ML IJ SOLN
20.0000 mg | Freq: Every day | INTRAMUSCULAR | Status: DC
  Administered 2013-12-09: 20 mg via INTRAVENOUS
  Filled 2013-12-09: qty 2

## 2013-12-09 MED ORDER — SODIUM CHLORIDE 0.9 % IJ SOLN: 3.0000 mL | INTRAMUSCULAR | Status: DC | PRN

## 2013-12-09 MED ORDER — ALBUTEROL SULFATE (2.5 MG/3ML) 0.083% IN NEBU
2.5000 mg | INHALATION_SOLUTION | RESPIRATORY_TRACT | Status: DC | PRN
  Administered 2013-12-09: 2.5 mg via RESPIRATORY_TRACT
  Filled 2013-12-09: qty 3

## 2013-12-09 MED ORDER — METOPROLOL TARTRATE 12.5 MG HALF TABLET
12.5000 mg | ORAL_TABLET | Freq: Two times a day (BID) | ORAL | Status: DC
  Administered 2013-12-09: 12.5 mg via ORAL
  Filled 2013-12-09 (×2): qty 1

## 2013-12-09 MED ORDER — FUROSEMIDE 10 MG/ML IJ SOLN
40.0000 mg | Freq: Every day | INTRAMUSCULAR | Status: DC
  Administered 2013-12-10: 40 mg via INTRAVENOUS
  Filled 2013-12-09: qty 4

## 2013-12-09 MED ORDER — ACETAMINOPHEN 325 MG PO TABS
650.0000 mg | ORAL_TABLET | ORAL | Status: DC | PRN
  Administered 2013-12-14: 650 mg via ORAL
  Filled 2013-12-09: qty 2

## 2013-12-09 MED ORDER — HEPARIN SODIUM (PORCINE) 5000 UNIT/ML IJ SOLN
5000.0000 [IU] | Freq: Three times a day (TID) | INTRAMUSCULAR | Status: DC
  Administered 2013-12-09 – 2013-12-13 (×13): 5000 [IU] via SUBCUTANEOUS
  Filled 2013-12-09 (×15): qty 1

## 2013-12-09 MED ORDER — POTASSIUM CHLORIDE CRYS ER 20 MEQ PO TBCR: 40.0000 meq | EXTENDED_RELEASE_TABLET | Freq: Once | ORAL | Status: DC

## 2013-12-09 MED ORDER — ONDANSETRON HCL 4 MG/2ML IJ SOLN
4.0000 mg | Freq: Four times a day (QID) | INTRAMUSCULAR | Status: DC | PRN
  Administered 2013-12-13: 4 mg via INTRAVENOUS
  Filled 2013-12-09: qty 2

## 2013-12-09 MED ORDER — LORAZEPAM 0.5 MG PO TABS
0.2500 mg | ORAL_TABLET | Freq: Two times a day (BID) | ORAL | Status: DC
  Administered 2013-12-09 – 2013-12-10 (×2): 0.25 mg via ORAL
  Filled 2013-12-09 (×2): qty 1

## 2013-12-09 MED ORDER — MAGNESIUM SULFATE 2 GM/50ML IV SOLN
2.0000 g | Freq: Once | INTRAVENOUS | Status: AC
  Administered 2013-12-09: 2 g via INTRAVENOUS
  Filled 2013-12-09: qty 50

## 2013-12-09 MED ORDER — SODIUM CHLORIDE 0.9 % IV SOLN: 250.0000 mL | INTRAVENOUS | Status: DC | PRN

## 2013-12-09 MED ORDER — LEVOTHYROXINE SODIUM 100 MCG PO TABS
100.0000 ug | ORAL_TABLET | Freq: Every day | ORAL | Status: DC
  Administered 2013-12-09 – 2013-12-10 (×2): 100 ug via ORAL
  Filled 2013-12-09 (×4): qty 1

## 2013-12-09 MED ORDER — TRAMADOL HCL 50 MG PO TABS
50.0000 mg | ORAL_TABLET | Freq: Four times a day (QID) | ORAL | Status: DC | PRN
  Administered 2013-12-10: 50 mg via ORAL
  Filled 2013-12-09: qty 1

## 2013-12-09 MED ORDER — POTASSIUM CHLORIDE 20 MEQ/15ML (10%) PO SOLN
40.0000 meq | Freq: Once | ORAL | Status: AC
  Administered 2013-12-09: 40 meq via ORAL
  Filled 2013-12-09: qty 30

## 2013-12-09 MED ORDER — LISINOPRIL 2.5 MG PO TABS
2.5000 mg | ORAL_TABLET | Freq: Every day | ORAL | Status: DC
  Administered 2013-12-09 – 2013-12-10 (×2): 2.5 mg via ORAL
  Filled 2013-12-09 (×3): qty 1

## 2013-12-09 NOTE — ED Notes (Signed)
Per Allena KatzPatel, MD pt can eat. He states that he has also taken the pt off the Bi-Pap and placed her on a nasal cannula. Allena KatzPatel, MD instructs this RN to start the pt off with sips of water, and advance diet as tolerated.

## 2013-12-09 NOTE — ED Provider Notes (Signed)
I saw and evaluated the patient, reviewed the resident's note and I agree with the findings and plan.   EKG Interpretation   Date/Time:  Thursday December 08 2013 20:13:22 EST Ventricular Rate:  104 PR Interval:    QRS Duration: 163 QT Interval:  431 QTC Calculation: 567 R Axis:   -15 Text Interpretation:  Atrial fibrillation Left bundle branch block  Artifact Confirmed by Tamana Hatfield  MD, Jamorris Ndiaye [phone]([id number]) on 12/08/2013 9:25:42 PM      I saw and evaluated the patient, reviewed the resident's note and I agree with the findings and plan.   EKG Interpretation   Date/Time:  Thursday December 08 2013 20:13:22 EST Ventricular Rate:  104 PR Interval:    QRS Duration: 163 QT Interval:  431 QTC Calculation: 567 R Axis:   -15 Text Interpretation:  Atrial fibrillation Left bundle branch block  Artifact Confirmed by Lewanda Perea  MD, Stellarose Cerny [phone]([id number]) on 12/08/2013 9:25:42 PM      Results for orders placed or performed during the hospital encounter of 12/08/13  Basic metabolic panel  Result Value Ref Range   Sodium 137 137 - 147 mEq/L   Potassium 4.0 3.7 - 5.3 mEq/L   Chloride 98 96 - 112 mEq/L   CO2 27 19 - 32 mEq/L   Glucose, Bld 164 (H) 70 - 99 mg/dL   BUN 14 6 - 23 mg/dL   Creatinine, Ser 0.980.58 0.50 - 1.10 mg/dL   Calcium 9.2 8.4 - 11.910.5 mg/dL   GFR calc non Af Amer 80 (L) >90 mL/min   GFR calc Af Amer >90 >90 mL/min   Anion gap 12 5 - 15  Pro b natriuretic peptide (BNP)  Result Value Ref Range   Pro B Natriuretic peptide (BNP) 25365.0 (H) 0 - 450 pg/mL  Troponin I  Result Value Ref Range   Troponin I <0.30 <0.30 ng/mL  CBC with Differential  Result Value Ref Range   WBC 9.9 4.0 - 10.5 K/uL   RBC 4.19 3.87 - 5.11 MIL/uL   Hemoglobin 13.4 12.0 - 15.0 g/dL   HCT 14.740.1 82.936.0 - 56.246.0 %   MCV 95.7 78.0 - 100.0 fL   MCH 32.0 26.0 - 34.0 pg   MCHC 33.4 30.0 - 36.0 g/dL   RDW 13.015.2 86.511.5 - 78.415.5 %   Platelets 140 (L) 150 - 400 K/uL   Neutrophils Relative % 83 (H) 43 - 77 %   Neutro Abs  8.2 (H) 1.7 - 7.7 K/uL   Lymphocytes Relative 12 12 - 46 %   Lymphs Abs 1.2 0.7 - 4.0 K/uL   Monocytes Relative 5 3 - 12 %   Monocytes Absolute 0.5 0.1 - 1.0 K/uL   Eosinophils Relative 0 0 - 5 %   Eosinophils Absolute 0.0 0.0 - 0.7 K/uL   Basophils Relative 0 0 - 1 %   Basophils Absolute 0.0 0.0 - 0.1 K/uL  Urinalysis, Routine w reflex microscopic  Result Value Ref Range   Color, Urine YELLOW YELLOW   APPearance CLEAR CLEAR   Specific Gravity, Urine 1.017 1.005 - 1.030   pH 5.0 5.0 - 8.0   Glucose, UA NEGATIVE NEGATIVE mg/dL   Hgb urine dipstick MODERATE (A) NEGATIVE   Bilirubin Urine NEGATIVE NEGATIVE   Ketones, ur NEGATIVE NEGATIVE mg/dL   Protein, ur NEGATIVE NEGATIVE mg/dL   Urobilinogen, UA 0.2 0.0 - 1.0 mg/dL   Nitrite NEGATIVE NEGATIVE   Leukocytes, UA NEGATIVE NEGATIVE  Urine microscopic-add on  Result Value Ref Range  WBC, UA 0-2 <3 WBC/hpf   RBC / HPF 3-6 <3 RBC/hpf   Bacteria, UA RARE RARE   Urine-Other MUCOUS PRESENT   I-Stat venous blood gas, ED  Result Value Ref Range   pH, Ven 7.332 (H) 7.250 - 7.300   pCO2, Ven 51.8 (H) 45.0 - 50.0 mmHg   pO2, Ven 26.0 (LL) 30.0 - 45.0 mmHg   Bicarbonate 27.4 (H) 20.0 - 24.0 mEq/L   TCO2 29 0 - 100 mmol/L   O2 Saturation 42.0 %   Acid-Base Excess 1.0 0.0 - 2.0 mmol/L   Collection site BRACHIAL ARTERY    Sample type VENOUS    Comment NOTIFIED PHYSICIAN   I-Stat venous blood gas, ED  Result Value Ref Range   pH, Ven 7.348 (H) 7.250 - 7.300   pCO2, Ven 53.0 (H) 45.0 - 50.0 mmHg   pO2, Ven 28.0 (LL) 30.0 - 45.0 mmHg   Bicarbonate 29.1 (H) 20.0 - 24.0 mEq/L   TCO2 31 0 - 100 mmol/L   O2 Saturation 48.0 %   Acid-Base Excess 2.0 0.0 - 2.0 mmol/L   Collection site BRACHIAL ARTERY    Sample type VENOUS    Comment NOTIFIED PHYSICIAN    Dg Chest Port 1 View  12/08/2013   CLINICAL DATA:  Shortness of breath  EXAM: PORTABLE CHEST - 1 VIEW  COMPARISON:  01/20/2013  FINDINGS: There is chronic cardiomegaly. Enlargement of the  hila, likely vascular congestion. Negative aortic contours.  Diffuse interstitial opacity with Kerley lines. Haziness of the lower chest consistent with either atelectasis and/or pleural fluid. There is a large gas-filled hiatal hernia.  No evidence of pneumothorax.  IMPRESSION: 1. Pulmonary edema, pleural effusions, and basilar atelectasis. These changes could obscure pneumonia. 2. Large hiatal hernia.   Electronically Signed   By: Tiburcio PeaJonathan  Watts M.D.   On: 12/08/2013 21:09    CRITICAL CARE Performed by: Vanetta MuldersZACKOWSKI,Elyza Whitt Total critical care time: 30 Critical care time was exclusive of separately billable procedures and treating other patients. Critical care was necessary to treat or prevent imminent or life-threatening deterioration. Critical care was time spent personally by me on the following activities: development of treatment plan with patient and/or surrogate as well as nursing, discussions with consultants, evaluation of patient's response to treatment, examination of patient, obtaining history from patient or surrogate, ordering and performing treatments and interventions, ordering and review of laboratory studies, ordering and review of radiographic studies, pulse oximetry and re-evaluation of patient's condition.   Patient brought in by EMS. Patient was found on the floor by EMS short of breath bilateral wheezing and Riles. EMS placed her on Cipro. Given albuterol 5 mg nebulizer. Upon arrival patient supposedly according the EMS with some improved breathing but still in some distress. Switched over to BiPAP. Workup more consistent with pulmonary edema. Patient patient will require admission. Admitting team consult. Patient showing gradual improvement here. Patient eventually can be weaned off of the BiPAP and is now on nasal cannula oxygen. Patient's lungs on presentation had more rales and some faint wheezing. Patient's EKG showed a left bundle branch block with atrial fibrillation. No  significant change. Troponin negative. Significant elevation in BNP. Similar to past admissions. Leave the patient's breathing problems was predominantly of congestive heart failure.   Vanetta MuldersScott Tonye Tancredi, MD 12/09/13 [phone]

## 2013-12-09 NOTE — ED Notes (Signed)
Breakfast tray delivered

## 2013-12-09 NOTE — H&P (Signed)
[facility] History and Physical  Patient: Elizabeth [name]  ZOX:[id number]  DOB: [birth year]  DOS: the patient was seen and examined on 12/08/2013 PCP: Aida Puffer, MD  Chief Complaint: fall  HPI: Elizabeth [name] is a 80 y.o. female with Past medical history of hypothyroidism, hypertension, gait abnormality, chronic systolic heart failure, osteoporosis, compression fracture of the lumbar vertebra.. The patient was found on the floor by EMS. Patient was found on the floor by her family member and EMS was called. Patient was initially found to have hypoxia with saturations remaining 86 on room air with wheezing. After placing the patient on C Pap she was brought here and due to her persistent increased work of breathing when she was continued on BiPAP. Patient mentions she has gained some fluid over last few days denies any chest pain complaints of shortness of breath and cough denies any nausea or vomiting or acid reflux. She mentions she is taking all her medications no dizziness no lightheadedness. She denies any focal deficit diarrhea or burning urination. She lives alone close to her son  The patient is coming from home. And at her baseline dependent for most of her ADL.  Review of Systems: as mentioned in the history of present illness.  A Comprehensive review of the other systems is negative.  Past Medical History  Diagnosis Date  . Thyroid disease   . Hypertension   . Abnormality of gait 04/21/2008    Qualifier: Diagnosis of  By: Sheffield Slider MD, Deniece Portela    . ALCOHOL ABUSE 04/21/2008    Qualifier: Diagnosis of  By: Sheffield Slider MD, Deniece Portela    . ANEMIA, SECONDARY TO ACUTE BLOOD LOSS 04/21/2008    Qualifier: Diagnosis of  By: Sheffield Slider MD, Deniece Portela    . DEPRESSION/ANXIETY 04/21/2008    Qualifier: Diagnosis of  By: Sheffield Slider MD, Deniece Portela    . Edema 05/23/2008    Qualifier: Diagnosis of  By: Eden Emms, MD, Harrington Challenger   . LOSS OF APPETITE 04/21/2008    Qualifier: Diagnosis of  By: Sheffield Slider MD, Deniece Portela    . UTI  04/21/2008    Qualifier: Diagnosis of  By: Sheffield Slider MD, Deniece Portela    . CLOSED FRACTURE INTERTROCHANTERIC SECTION FEMUR 04/21/2008    Qualifier: Diagnosis of  By: Sheffield Slider MD, Deniece Portela    . OSTEOARTHRITIS, KNEE, LEFT, MILD 04/21/2008    Qualifier: Diagnosis of  By: Sheffield Slider MD, Deniece Portela    . OSTEOPOROSIS 04/21/2008    Qualifier: Diagnosis of  By: Sheffield Slider MD, Deniece Portela    . HYPOTHYROIDISM 04/21/2008    Qualifier: Diagnosis of  By: Sheffield Slider MD, Deniece Portela    . GALLBLADDER DISEASE 05/22/2008    Qualifier: Diagnosis of  By: Kem Parkinson    . Acute CHF (congestive heart failure) 01/21/2013  . Essential hypertension, benign 04/21/2008    Qualifier: Diagnosis of  By: Sheffield Slider MD, Deniece Portela    . LBBB 05/23/2008    Qualifier: Diagnosis of  By: Eden Emms, MD, Harrington Challenger    Past Surgical History  Procedure Laterality Date  . Cholecystectomy    . Femur im nail  09/09/2011    Procedure: INTRAMEDULLARY (IM) NAIL FEMORAL;  Surgeon: Kathryne Hitch, MD;  Location: [facility] OR;  Service: Orthopedics;  Laterality: Left;  distal interlocking screws   Social History:  reports that she has never smoked. Her smokeless tobacco use includes Snuff. She reports that she does not drink alcohol or use illicit drugs.  No Known Allergies  History reviewed. No pertinent family history.  Prior to  Admission medications   Medication Sig Start Date End Date Taking? Authorizing Provider  calcium-vitamin D (OSCAL WITH D) 500-200 MG-UNIT per tablet Take 2 tablets by mouth 2 (two) times daily. 08/08/13  Yes Leroy SeaPrashant K Singh, MD  Cyanocobalamin (VITAMIN B-12 PO) Take 1 tablet by mouth daily.   Yes Historical Provider, MD  furosemide (LASIX) 20 MG tablet Take 20 mg by mouth 2 (two) times daily.    Yes Historical Provider, MD  ibuprofen (ADVIL,MOTRIN) 200 MG tablet Take 800 mg by mouth every 8 (eight) hours as needed (pain).   Yes Historical Provider, MD  levothyroxine (SYNTHROID, LEVOTHROID) 100 MCG tablet Take 100 mcg by mouth daily before breakfast.   Yes  Historical Provider, MD  lipase/protease/amylase (CREON-12/PANCREASE) 12000 UNITS CPEP capsule Take 2 capsules by mouth 3 (three) times daily before meals. And take 1610912000 units with snacks 03/17/13  Yes Sharee Holstereborah S Green, NP  lisinopril (PRINIVIL,ZESTRIL) 2.5 MG tablet Take 2.5 mg by mouth daily.   Yes Historical Provider, MD  LORazepam (ATIVAN) 0.5 MG tablet TAKE ONE-HALF TABLET BY MOUTH TWICE DAILY 10/06/13  Yes Tiffany L Reed, DO  metoprolol tartrate (LOPRESSOR) 12.5 mg TABS tablet Take 0.5 tablets (12.5 mg total) by mouth 2 (two) times daily. 01/25/13  Yes Esperanza SheetsUlugbek N Buriev, MD  potassium chloride (K-DUR,KLOR-CON) 10 MEQ tablet Take 10 mEq by mouth daily.   Yes Historical Provider, MD  promethazine (PHENERGAN) 25 MG tablet Take 25 mg by mouth every 6 (six) hours as needed for nausea or vomiting.   Yes Historical Provider, MD  traMADol (ULTRAM) 50 MG tablet Take 1 tablet (50 mg total) by mouth every 6 (six) hours as needed for moderate pain. 08/08/13  Yes Leroy SeaPrashant K Singh, MD  Vitamin D, Ergocalciferol, (DRISDOL) 50000 UNITS CAPS capsule Take 50,000 Units by mouth every 7 (seven) days.   Yes Historical Provider, MD  bisacodyl (DULCOLAX) 10 MG suppository Place 1 suppository (10 mg total) rectally daily. Patient not taking: Reported on 12/08/2013 08/08/13   Leroy SeaPrashant K Singh, MD  docusate sodium 100 MG CAPS Take 200 mg by mouth 2 (two) times daily. Patient not taking: Reported on 12/08/2013 08/08/13   Leroy SeaPrashant K Singh, MD  fentaNYL (DURAGESIC - DOSED MCG/HR) 12 MCG/HR Place 1 patch (12.5 mcg total) onto the skin every 3 (three) days. Patient not taking: Reported on 12/08/2013 08/08/13   Leroy SeaPrashant K Singh, MD  polyethylene glycol (MIRALAX / Ethelene HalGLYCOLAX) packet Take 17 g by mouth 2 (two) times daily. Patient not taking: Reported on 12/08/2013 08/08/13   Leroy SeaPrashant K Singh, MD    Physical Exam: Filed Vitals:   12/09/13 0445 12/09/13 0500 12/09/13 0515 12/09/13 0530  BP: 120/54 119/47 115/55 119/61  Pulse: 51 60 61 66   Temp:      TempSrc:      Resp: 19 10 10 12   Height:      Weight:      SpO2: 98% 98% 99% 99%    General: Alert, Awake and Oriented to Time, Place and Person. Appear in mild distress Eyes: PERRL ENT: Oral Mucosa clear moist. Neck: mild JVD Cardiovascular: S1 and S2 Present, aortic systolic Murmur, Peripheral Pulses Present Respiratory: Bilateral Air entry equal and Decreased, bilateral faintCrackles, bilateral expiratory wheezes Abdomen: Bowel Sound present, Soft and non tender Skin: no Rash Extremities: no Pedal edema, no calf tenderness Neurologic: Grossly no focal neuro deficit.  Labs on Admission:  CBC:  Recent Labs Lab 12/08/13 2030 12/09/13 0533  WBC 9.9 5.3  NEUTROABS 8.2* 3.9  HGB 13.4 11.2*  HCT 40.1 34.1*  MCV 95.7 95.3  PLT 140* 129*    CMP     Component Value Date/Time   NA 140 12/09/2013 0533   K 3.3* 12/09/2013 0533   CL 101 12/09/2013 0533   CO2 28 12/09/2013 0533   GLUCOSE 122* 12/09/2013 0533   BUN 13 12/09/2013 0533   CREATININE 0.51 12/09/2013 0533   CALCIUM 8.5 12/09/2013 0533   PROT 5.3* 12/09/2013 0533   ALBUMIN 3.0* 12/09/2013 0533   AST 17 12/09/2013 0533   ALT 10 12/09/2013 0533   ALKPHOS 86 12/09/2013 0533   BILITOT 0.5 12/09/2013 0533   GFRNONAA 83* 12/09/2013 0533   GFRAA >90 12/09/2013 0533    No results for input(s): LIPASE, AMYLASE in the last 168 hours. No results for input(s): AMMONIA in the last 168 hours.   Recent Labs Lab 12/08/13 2030 12/09/13 0533  TROPONINI <0.30 <0.30   BNP (last 3 results)  Recent Labs  01/20/13 2021 01/21/13 0902 12/08/13 2030  PROBNP 31867.0* 34451.0* 25365.0*    Radiological Exams on Admission: Dg Chest Port 1 View  12/08/2013   CLINICAL DATA:  Shortness of breath  EXAM: PORTABLE CHEST - 1 VIEW  COMPARISON:  01/20/2013  FINDINGS: There is chronic cardiomegaly. Enlargement of the hila, likely vascular congestion. Negative aortic contours.  Diffuse interstitial opacity with Kerley  lines. Haziness of the lower chest consistent with either atelectasis and/or pleural fluid. There is a large gas-filled hiatal hernia.  No evidence of pneumothorax.  IMPRESSION: 1. Pulmonary edema, pleural effusions, and basilar atelectasis. These changes could obscure pneumonia. 2. Large hiatal hernia.   Electronically Signed   By: Tiburcio PeaJonathan  Watts M.D.   On: 12/08/2013 21:09    EKG: Independently reviewed. Ventricular bigeminy and LBBB chronic.  Assessment/Plan Principal Problem:   Acute on chronic systolic congestive heart failure Active Problems:   Hypothyroidism   DEPRESSION/ANXIETY   Essential hypertension, benign   Hypokalemia   GERD (gastroesophageal reflux disease)   1. Acute on chronic systolic congestive heart failure The patient is presenting with complaints of acute on chronic CHF. She has received IV Lasix in the ER and I would continue her on the Lasix during the day as well. She has been on BiPAP at present she is saturating well on on nasal cannula which I would continue. Incentive spirometry. Limited echocardiogram in the morning.  2.hypothyroidism. Continue Synthroid.  3.possible fall, possible unresponsiveness, found on the floor. Close monitoring in the step down unit. Patient does not have any focal deficit.  4.essential hypertension. Continue home medications.  Advance goals of care discussion: DNR/DNI   DVT Prophylaxis: subcutaneous Heparin Nutrition: cardiac diet  Disposition: Admitted to inpatient step-down  Author: Lynden OxfordPranav Kimbely Whiteaker, MD [facility] Pager: [phone] 12/09/2013, 6:42 AM    If 7PM-7AM, please contact night-coverage [email or web address] Password [email or web address]

## 2013-12-09 NOTE — Progress Notes (Signed)
UR Completed Teagen Mcleary Graves-Bigelow, RN,BSN [phone]  

## 2013-12-09 NOTE — Progress Notes (Addendum)
PATIENT DETAILS Name: Elizabeth [name] Age: 80 y.o. Sex: female Date of Birth: 1923-08-17 Admit Date: 12/08/2013 Admitting Physician Lynden OxfordPranav Patel, MD ZOX:WRUEAV,WUJWJPCP:LITTLE,JAMES, MD  Subjective: Feels much better  Assessment/Plan: Principal Problem:   Acute on chronic systolic congestive heart failure:last echo in December 2014 showed EF around 20-25%.Continue Lasix 40 mg daily, await repeat echocardiogram. Follow potassium and renal function closely. Cardiology consulted.  Active Problems:  Acute Hypoxic Resp Failure:secondary to above, required BiPAP initially, now doing well on O2 via [place].    ?Syncope: not clear exactly whether patient passed out prior to this hospitalization. Given significantly low ejection fraction, VT remains a concern.Seen by cardiology, not a candidate for AICD.Continue to monitor in telemetry, await repeat echocardiogram.    Hypothyroidism: Continue with levothyroxine.    DEPRESSION/ANXIETY:resume lorazepam.    Essential hypertension, benign:controlled, continue withlisinopril and Lasix. Add low-dose beta blocker if BP permits.    Hypokalemia:Replete and recheck. Likely secondary to Lasix.    Frailty/deconditioning: PT eval.  Disposition: Remain inpatient  Antibiotics:  None  DVT Prophylaxis: Prophylactic Heparin  Code Status:  DNR  Family Communication Tried Calling Nephew [name] to leave voicemail   Procedures:  None  CONSULTS:  cardiology  Time spent 40 minutes-which includes 50% of the time with face-to-face with patient/ family and coordinating care related to the above assessment and plan.    MEDICATIONS: Scheduled Meds: . [START ON 12/10/2013] furosemide  40 mg Intravenous Daily  . heparin  5,000 Units Subcutaneous 3 times per day  . levothyroxine  100 mcg Oral QAC breakfast  . lisinopril  2.5 mg Oral Daily  . sodium chloride  3 mL Intravenous Q12H   Continuous Infusions:  PRN Meds:.sodium chloride,  acetaminophen, ondansetron (ZOFRAN) IV, sodium chloride, traMADol  Antibiotics: Anti-infectives    None       PHYSICAL EXAM: Vital signs in last 24 hours: Filed Vitals:   12/09/13 1000 12/09/13 1100 12/09/13 1200 12/09/13 1210  BP:    122/62  Pulse:    77  Temp:      TempSrc:      Resp:   12   Height:      Weight:      SpO2: 100% 100% 100% 99%    Weight change:  Filed Weights   12/08/13 2017  Weight: 48.081 kg (106 lb)   Body mass index is 18.78 kg/(m^2).   Gen Exam: Awake and alert with clear speech.   Neck: Supple, No JVD.   Chest:Bibasilar rales-but with decreased air entry  CVS: S1 S2 Regular, 3/6 syst murmur.  Abdomen: soft, BS +, non tender, non distended.  Extremities: no edema, lower extremities warm to touch. Neurologic: Non Focal.   Skin: No Rash.   Wounds: N/A.   Intake/Output from previous day:  Intake/Output Summary (Last 24 hours) at 12/09/13 1531 Last data filed at 12/09/13 1303  Gross per 24 hour  Intake    240 ml  Output   2775 ml  Net  -2535 ml     LAB RESULTS: CBC  Recent Labs Lab 12/08/13 2030 12/09/13 0533  WBC 9.9 5.3  HGB 13.4 11.2*  HCT 40.1 34.1*  PLT 140* 129*  MCV 95.7 95.3  MCH 32.0 31.3  MCHC 33.4 32.8  RDW 15.2 15.1  LYMPHSABS 1.2 0.9  MONOABS 0.5 0.4  EOSABS 0.0 0.0  BASOSABS 0.0 0.0    Chemistries   Recent Labs Lab 12/08/13 2030 12/09/13 0533 12/09/13 0945  NA  137 140  --   K 4.0 3.3*  --   CL 98 101  --   CO2 27 28  --   GLUCOSE 164* 122*  --   BUN 14 13  --   CREATININE 0.58 0.51  --   CALCIUM 9.2 8.5  --   MG  --   --  1.8    CBG: No results for input(s): GLUCAP in the last 168 hours.  GFR Estimated Creatinine Clearance: 36.9 mL/min (by C-G formula based on Cr of 0.51).  Coagulation profile  Recent Labs Lab 12/09/13 0533  INR 1.04    Cardiac Enzymes  Recent Labs Lab 12/08/13 2030 12/09/13 0533 12/09/13 0945  TROPONINI <0.30 <0.30 <0.30    Invalid input(s): POCBNP No  results for input(s): DDIMER in the last 72 hours. No results for input(s): HGBA1C in the last 72 hours. No results for input(s): CHOL, HDL, LDLCALC, TRIG, CHOLHDL, LDLDIRECT in the last 72 hours. No results for input(s): TSH, T4TOTAL, T3FREE, THYROIDAB in the last 72 hours.  Invalid input(s): FREET3 No results for input(s): VITAMINB12, FOLATE, FERRITIN, TIBC, IRON, RETICCTPCT in the last 72 hours. No results for input(s): LIPASE, AMYLASE in the last 72 hours.  Urine Studies No results for input(s): UHGB, CRYS in the last 72 hours.  Invalid input(s): UACOL, UAPR, USPG, UPH, UTP, UGL, UKET, UBIL, UNIT, UROB, ULEU, UEPI, UWBC, URBC, UBAC, CAST, UCOM, BILUA  MICROBIOLOGY: No results found for this or any previous visit (from the past 240 hour(s)).  RADIOLOGY STUDIES/RESULTS: Dg Chest Port 1 View  12/08/2013   CLINICAL DATA:  Shortness of breath  EXAM: PORTABLE CHEST - 1 VIEW  COMPARISON:  01/20/2013  FINDINGS: There is chronic cardiomegaly. Enlargement of the hila, likely vascular congestion. Negative aortic contours.  Diffuse interstitial opacity with Kerley lines. Haziness of the lower chest consistent with either atelectasis and/or pleural fluid. There is a large gas-filled hiatal hernia.  No evidence of pneumothorax.  IMPRESSION: 1. Pulmonary edema, pleural effusions, and basilar atelectasis. These changes could obscure pneumonia. 2. Large hiatal hernia.   Electronically Signed   By: Tiburcio PeaJonathan  Watts M.D.   On: 12/08/2013 21:09    Jeoffrey MassedGHIMIRE,Glenda Spelman, MD  [facility] Pager:[phone]  If 7PM-7AM, please contact night-coverage [email or web address] Password [email or web address] 12/09/2013, 3:31 PM   LOS: 1 day

## 2013-12-09 NOTE — Progress Notes (Signed)
  Echocardiogram 2D Echocardiogram has been performed.  Elizabeth [name] 12/09/2013, 11:19 AM

## 2013-12-09 NOTE — Care Management Note (Unsigned)
    Page 1 of 1   12/12/2013     10:57:04 AM CARE MANAGEMENT NOTE 12/12/2013  Patient:  Elizabeth [name],Elizabeth [name]   Account Number:  [id number]  Date Initiated:  12/09/2013  Documentation initiated by:  GRAVES-BIGELOW,Romain Erion  Subjective/Objective Assessment:   Pt admitted for question syncope and congestive heart failure. Pt initiated on Bipap and IV lasix.     Action/Plan:   CM will continue to monitor for disposition needs.   Anticipated DC Date:  12/12/2013   Anticipated DC Plan:  SKILLED NURSING FACILITY      DC Planning Services  CM consult      Choice offered to / List presented to:             Status of service:  In process, will continue to follow Medicare Important Message given?   (If response is "NO", the following Medicare IM given date fields will be blank) Date Medicare IM given:   Medicare IM given by:   Date Additional Medicare IM given:   Additional Medicare IM given by:    Discharge Disposition:    Per UR Regulation:  Reviewed for med. necessity/level of care/duration of stay  If discussed at Long Length of Stay Meetings, dates discussed:   12/13/2013    Comments:  12-12-13 1052 Tomi BambergerBrenda Graves-Bigelow, RN,BSN [phone] Acute on chronic systolic congestive heart failure-Continues on IV lasix. 11/7-abdominal x-ray confirmed ileus. NG to be clamped and initiated clears. Plan will be for SNF once medically stable.

## 2013-12-09 NOTE — ED Notes (Addendum)
Pt taken off of BIPAP by admitting MD, placed on 4L 02 at 0003.

## 2013-12-09 NOTE — ED Notes (Signed)
Attempted report 

## 2013-12-09 NOTE — ED Notes (Signed)
Patient tolerating water. This RN gave the pt saltine crackers and a coke.

## 2013-12-09 NOTE — Consult Note (Signed)
Primary cardiologist: Leonides Sake  HPI: 80 year old female for evaluation of question syncope and congestive heart failure. Last echocardiogram December 2014 showed an ejection fraction of 20-25%. There was moderate left atrial enlargement and mild to moderate mitral regurgitation. There was mild right atrial and right ventricular enlargement. Patient is extremely frail and a no CODE BLUE. She apparently spends most of her time in a wheelchair by her report. She states she can ambulate with a walker. She apparently is supposed to be on oxygen at home but was not using this yesterday. She was found by her family to be hypoxic and unresponsive. The patient states she has not been short of breath until yesterday. There is no chest pain, palpitations, syncope. No fevers or chills and no productive cough. She has been found to have congestive heart failure and cardiology asked to evaluate. She states she does take her medications.  Medications Prior to Admission  Medication Sig Dispense Refill  . calcium-vitamin D (OSCAL WITH D) 500-200 MG-UNIT per tablet Take 2 tablets by mouth 2 (two) times daily.    . Cyanocobalamin (VITAMIN B-12 PO) Take 1 tablet by mouth daily.    . furosemide (LASIX) 20 MG tablet Take 20 mg by mouth 2 (two) times daily.     Marland Kitchen ibuprofen (ADVIL,MOTRIN) 200 MG tablet Take 800 mg by mouth every 8 (eight) hours as needed (pain).    Marland Kitchen levothyroxine (SYNTHROID, LEVOTHROID) 100 MCG tablet Take 100 mcg by mouth daily before breakfast.    . lipase/protease/amylase (CREON-12/PANCREASE) 12000 UNITS CPEP capsule Take 2 capsules by mouth 3 (three) times daily before meals. And take 12000 units with snacks 270 capsule 11  . lisinopril (PRINIVIL,ZESTRIL) 2.5 MG tablet Take 2.5 mg by mouth daily.    Marland Kitchen LORazepam (ATIVAN) 0.5 MG tablet TAKE ONE-HALF TABLET BY MOUTH TWICE DAILY 60 tablet 0  . metoprolol tartrate (LOPRESSOR) 12.5 mg TABS tablet Take 0.5 tablets (12.5 mg total) by mouth 2 (two) times  daily.    . potassium chloride (K-DUR,KLOR-CON) 10 MEQ tablet Take 10 mEq by mouth daily.    . promethazine (PHENERGAN) 25 MG tablet Take 25 mg by mouth every 6 (six) hours as needed for nausea or vomiting.    . traMADol (ULTRAM) 50 MG tablet Take 1 tablet (50 mg total) by mouth every 6 (six) hours as needed for moderate pain. 30 tablet 0  . Vitamin D, Ergocalciferol, (DRISDOL) 50000 UNITS CAPS capsule Take 50,000 Units by mouth every 7 (seven) days.    . bisacodyl (DULCOLAX) 10 MG suppository Place 1 suppository (10 mg total) rectally daily. (Patient not taking: Reported on 12/08/2013) 12 suppository 0  . docusate sodium 100 MG CAPS Take 200 mg by mouth 2 (two) times daily. (Patient not taking: Reported on 12/08/2013) 10 capsule 0  . fentaNYL (DURAGESIC - DOSED MCG/HR) 12 MCG/HR Place 1 patch (12.5 mcg total) onto the skin every 3 (three) days. (Patient not taking: Reported on 12/08/2013) 5 patch 0  . polyethylene glycol (MIRALAX / GLYCOLAX) packet Take 17 g by mouth 2 (two) times daily. (Patient not taking: Reported on 12/08/2013) 14 each 0    No Known Allergies   Past Medical History  Diagnosis Date  . Thyroid disease   . Abnormality of gait 04/21/2008    Qualifier: Diagnosis of  By: Walker Kehr MD, Patrick Jupiter    . ALCOHOL ABUSE 04/21/2008    Qualifier: Diagnosis of  By: Walker Kehr MD, Patrick Jupiter    . ANEMIA, SECONDARY TO ACUTE BLOOD  LOSS 04/21/2008    Qualifier: Diagnosis of  By: Sheffield Slider MD, Deniece Portela    . DEPRESSION/ANXIETY 04/21/2008    Qualifier: Diagnosis of  By: Sheffield Slider MD, Deniece Portela    . UTI 04/21/2008    Qualifier: Diagnosis of  By: Sheffield Slider MD, Deniece Portela    . CLOSED FRACTURE INTERTROCHANTERIC SECTION FEMUR 04/21/2008    Qualifier: Diagnosis of  By: Sheffield Slider MD, Deniece Portela    . OSTEOARTHRITIS, KNEE, LEFT, MILD 04/21/2008    Qualifier: Diagnosis of  By: Sheffield Slider MD, Deniece Portela    . OSTEOPOROSIS 04/21/2008    Qualifier: Diagnosis of  By: Sheffield Slider MD, Deniece Portela    . HYPOTHYROIDISM 04/21/2008    Qualifier: Diagnosis of  By: Sheffield Slider MD, Deniece Portela    . GALLBLADDER  DISEASE 05/22/2008    Qualifier: Diagnosis of  By: Kem Parkinson    . Acute CHF (congestive heart failure) 01/21/2013  . Essential hypertension, benign 04/21/2008    Qualifier: Diagnosis of  By: Sheffield Slider MD, Deniece Portela    . LBBB 05/23/2008    Qualifier: Diagnosis of  By: Eden Emms, MD, Harrington Challenger   . Hyperlipidemia     Past Surgical History  Procedure Laterality Date  . Cholecystectomy    . Femur im nail  09/09/2011    Procedure: INTRAMEDULLARY (IM) NAIL FEMORAL;  Surgeon: Kathryne Hitch, MD;  Location: [facility] OR;  Service: Orthopedics;  Laterality: Left;  distal interlocking screws    History   Social History  . Marital Status: Single    Spouse Name: N/A    Number of Children: N/A  . Years of Education: N/A   Occupational History  . Not on file.   Social History Main Topics  . Smoking status: Never Smoker   . Smokeless tobacco: Current User    Types: Snuff  . Alcohol Use: No  . Drug Use: No  . Sexual Activity: No   Other Topics Concern  . Not on file   Social History Narrative    Family History  Problem Relation Age of Onset  . Heart disease Father   . Heart disease Sister     ROS:  Recent pedal edema but no fevers or chills, productive cough, hemoptysis, dysphasia, odynophagia, melena, hematochezia, dysuria, hematuria, rash, seizure activity, orthopnea, PND, claudication. Remaining systems are negative.  Physical Exam:   Blood pressure 125/57, pulse 75, temperature 97.9 F (36.6 C), temperature source Oral, resp. rate 13, height 5\' 3"  (1.6 m), weight 106 lb (48.081 kg), SpO2 98 %.  General:  Well developed/frail in NAD Skin warm/dry; chronic skin changes lower ext Patient not depressed No peripheral clubbing Back-normal HEENT-normal/normal eyelids Neck supple/normal carotid upstroke bilaterally; no bruits; no JVD; no thyromegaly chest - diminished BS bases CV - RRR/normal S1 and S2; no rubs or gallops;  PMI nondisplaced, 2/6 systolic murmur  LSB Abdomen -NT/ND, no HSM, no mass, + bowel sounds, no bruit 2+ femoral pulses, no bruits Ext-no edema, chords; diminished distal pulses Neuro-grossly nonfocal  ECG Sinus rhythm with frequent PVCs and left bundle branch block.  Results for orders placed or performed during the hospital encounter of 12/08/13 (from the past 48 hour(s))  Basic metabolic panel     Status: Abnormal   Collection Time: 12/08/13  8:30 PM  Result Value Ref Range   Sodium 137 137 - 147 mEq/L   Potassium 4.0 3.7 - 5.3 mEq/L   Chloride 98 96 - 112 mEq/L   CO2 27 19 - 32 mEq/L   Glucose, Bld 164 (H) 70 - 99  mg/dL   BUN 14 6 - 23 mg/dL   Creatinine, Ser 0.58 0.50 - 1.10 mg/dL   Calcium 9.2 8.4 - 10.5 mg/dL   GFR calc non Af Amer 80 (L) >90 mL/min   GFR calc Af Amer >90 >90 mL/min    Comment: (NOTE) The eGFR has been calculated using the CKD EPI equation. This calculation has not been validated in all clinical situations. eGFR's persistently <90 mL/min signify possible Chronic Kidney Disease.    Anion gap 12 5 - 15  Pro b natriuretic peptide (BNP)     Status: Abnormal   Collection Time: 12/08/13  8:30 PM  Result Value Ref Range   Pro B Natriuretic peptide (BNP) 25365.0 (H) 0 - 450 pg/mL  Troponin I     Status: None   Collection Time: 12/08/13  8:30 PM  Result Value Ref Range   Troponin I <0.30 <0.30 ng/mL    Comment:        Due to the release kinetics of cTnI, a negative result within the first hours of the onset of symptoms does not rule out myocardial infarction with certainty. If myocardial infarction is still suspected, repeat the test at appropriate intervals.   CBC with Differential     Status: Abnormal   Collection Time: 12/08/13  8:30 PM  Result Value Ref Range   WBC 9.9 4.0 - 10.5 K/uL   RBC 4.19 3.87 - 5.11 MIL/uL   Hemoglobin 13.4 12.0 - 15.0 g/dL   HCT 40.1 36.0 - 46.0 %   MCV 95.7 78.0 - 100.0 fL   MCH 32.0 26.0 - 34.0 pg   MCHC 33.4 30.0 - 36.0 g/dL   RDW 15.2 11.5 - 15.5 %    Platelets 140 (L) 150 - 400 K/uL   Neutrophils Relative % 83 (H) 43 - 77 %   Neutro Abs 8.2 (H) 1.7 - 7.7 K/uL   Lymphocytes Relative 12 12 - 46 %   Lymphs Abs 1.2 0.7 - 4.0 K/uL   Monocytes Relative 5 3 - 12 %   Monocytes Absolute 0.5 0.1 - 1.0 K/uL   Eosinophils Relative 0 0 - 5 %   Eosinophils Absolute 0.0 0.0 - 0.7 K/uL   Basophils Relative 0 0 - 1 %   Basophils Absolute 0.0 0.0 - 0.1 K/uL  Urinalysis, Routine w reflex microscopic     Status: Abnormal   Collection Time: 12/08/13  8:58 PM  Result Value Ref Range   Color, Urine YELLOW YELLOW   APPearance CLEAR CLEAR   Specific Gravity, Urine 1.017 1.005 - 1.030   pH 5.0 5.0 - 8.0   Glucose, UA NEGATIVE NEGATIVE mg/dL   Hgb urine dipstick MODERATE (A) NEGATIVE   Bilirubin Urine NEGATIVE NEGATIVE   Ketones, ur NEGATIVE NEGATIVE mg/dL   Protein, ur NEGATIVE NEGATIVE mg/dL   Urobilinogen, UA 0.2 0.0 - 1.0 mg/dL   Nitrite NEGATIVE NEGATIVE   Leukocytes, UA NEGATIVE NEGATIVE  Urine microscopic-add on     Status: None   Collection Time: 12/08/13  8:58 PM  Result Value Ref Range   WBC, UA 0-2 <3 WBC/hpf   RBC / HPF 3-6 <3 RBC/hpf   Bacteria, UA RARE RARE   Urine-Other MUCOUS PRESENT   I-Stat venous blood gas, ED     Status: Abnormal   Collection Time: 12/08/13  8:59 PM  Result Value Ref Range   pH, Ven 7.332 (H) 7.250 - 7.300   pCO2, Ven 51.8 (H) 45.0 - 50.0 mmHg  pO2, Ven 26.0 (LL) 30.0 - 45.0 mmHg   Bicarbonate 27.4 (H) 20.0 - 24.0 mEq/L   TCO2 29 0 - 100 mmol/L   O2 Saturation 42.0 %   Acid-Base Excess 1.0 0.0 - 2.0 mmol/L   Collection site BRACHIAL ARTERY    Sample type VENOUS    Comment NOTIFIED PHYSICIAN   I-Stat venous blood gas, ED     Status: Abnormal   Collection Time: 12/08/13 10:09 PM  Result Value Ref Range   pH, Ven 7.348 (H) 7.250 - 7.300   pCO2, Ven 53.0 (H) 45.0 - 50.0 mmHg   pO2, Ven 28.0 (LL) 30.0 - 45.0 mmHg   Bicarbonate 29.1 (H) 20.0 - 24.0 mEq/L   TCO2 31 0 - 100 mmol/L   O2 Saturation 48.0 %    Acid-Base Excess 2.0 0.0 - 2.0 mmol/L   Collection site BRACHIAL ARTERY    Sample type VENOUS    Comment NOTIFIED PHYSICIAN   CBC with Differential     Status: Abnormal   Collection Time: 12/09/13  5:33 AM  Result Value Ref Range   WBC 5.3 4.0 - 10.5 K/uL   RBC 3.58 (L) 3.87 - 5.11 MIL/uL   Hemoglobin 11.2 (L) 12.0 - 15.0 g/dL   HCT 34.1 (L) 36.0 - 46.0 %   MCV 95.3 78.0 - 100.0 fL   MCH 31.3 26.0 - 34.0 pg   MCHC 32.8 30.0 - 36.0 g/dL   RDW 15.1 11.5 - 15.5 %   Platelets 129 (L) 150 - 400 K/uL   Neutrophils Relative % 73 43 - 77 %   Neutro Abs 3.9 1.7 - 7.7 K/uL   Lymphocytes Relative 17 12 - 46 %   Lymphs Abs 0.9 0.7 - 4.0 K/uL   Monocytes Relative 8 3 - 12 %   Monocytes Absolute 0.4 0.1 - 1.0 K/uL   Eosinophils Relative 1 0 - 5 %   Eosinophils Absolute 0.0 0.0 - 0.7 K/uL   Basophils Relative 1 0 - 1 %   Basophils Absolute 0.0 0.0 - 0.1 K/uL  Comprehensive metabolic panel     Status: Abnormal   Collection Time: 12/09/13  5:33 AM  Result Value Ref Range   Sodium 140 137 - 147 mEq/L   Potassium 3.3 (L) 3.7 - 5.3 mEq/L    Comment: DELTA CHECK NOTED   Chloride 101 96 - 112 mEq/L   CO2 28 19 - 32 mEq/L   Glucose, Bld 122 (H) 70 - 99 mg/dL   BUN 13 6 - 23 mg/dL   Creatinine, Ser 0.51 0.50 - 1.10 mg/dL   Calcium 8.5 8.4 - 10.5 mg/dL   Total Protein 5.3 (L) 6.0 - 8.3 g/dL   Albumin 3.0 (L) 3.5 - 5.2 g/dL   AST 17 0 - 37 U/L    Comment: HEMOLYSIS AT THIS LEVEL MAY AFFECT RESULT   ALT 10 0 - 35 U/L   Alkaline Phosphatase 86 39 - 117 U/L   Total Bilirubin 0.5 0.3 - 1.2 mg/dL   GFR calc non Af Amer 83 (L) >90 mL/min   GFR calc Af Amer >90 >90 mL/min    Comment: (NOTE) The eGFR has been calculated using the CKD EPI equation. This calculation has not been validated in all clinical situations. eGFR's persistently <90 mL/min signify possible Chronic Kidney Disease.    Anion gap 11 5 - 15  Protime-INR     Status: None   Collection Time: 12/09/13  5:33 AM  Result Value Ref  Range   Prothrombin Time 13.7 11.6 - 15.2 seconds   INR 1.04 0.00 - 1.49  Troponin I (q 6hr x 3)     Status: None   Collection Time: 12/09/13  5:33 AM  Result Value Ref Range   Troponin I <0.30 <0.30 ng/mL    Comment:        Due to the release kinetics of cTnI, a negative result within the first hours of the onset of symptoms does not rule out myocardial infarction with certainty. If myocardial infarction is still suspected, repeat the test at appropriate intervals.     Dg Chest Port 1 View  12/08/2013   CLINICAL DATA:  Shortness of breath  EXAM: PORTABLE CHEST - 1 VIEW  COMPARISON:  01/20/2013  FINDINGS: There is chronic cardiomegaly. Enlargement of the hila, likely vascular congestion. Negative aortic contours.  Diffuse interstitial opacity with Kerley lines. Haziness of the lower chest consistent with either atelectasis and/or pleural fluid. There is a large gas-filled hiatal hernia.  No evidence of pneumothorax.  IMPRESSION: 1. Pulmonary edema, pleural effusions, and basilar atelectasis. These changes could obscure pneumonia. 2. Large hiatal hernia.   Electronically Signed   By: Jorje Guild M.D.   On: 12/08/2013 21:09    Assessment/Plan 1 acute on chronic systolic congestive heart failure-Patient presents with increased dyspnea. Her chest x-ray shows congestive heart failure. Continue diuresis with Lasix 40 mg IV daily. Follow potassium and renal function. Echocardiogram is pending. 2 cardiomyopathy-patient was noted to have reduced LV function on previous echocardiogram in December 2014. Continue lisinopril at present dose. Discontinue metoprolol. Add carvedilol 3.125 mg by mouth twice a day. Titrate as tolerated by pulse and blood pressure. Patient is not a candidate for aggressive cardiac evaluation. Note she is a no CODE BLUE and is extremely frail. 3 question syncope-the patient denies this. There is no evidence of syncope. She is also not a candidate for ICD or aggressive  cardiac evaluation. 4 Hypertension-continue present medications.  Kirk Ruths MD 12/09/2013, 10:00 AM

## 2013-12-10 ENCOUNTER — Inpatient Hospital Stay (HOSPITAL_COMMUNITY): Payer: Medicare Other

## 2013-12-10 LAB — BASIC METABOLIC PANEL
ANION GAP: 9 (ref 5–15)
BUN: 18 mg/dL (ref 6–23)
CO2: 31 meq/L (ref 19–32)
CREATININE: 0.67 mg/dL (ref 0.50–1.10)
Calcium: 8.5 mg/dL (ref 8.4–10.5)
Chloride: 101 mEq/L (ref 96–112)
GFR calc Af Amer: 88 mL/min — ABNORMAL LOW (ref >90)
GFR calc non Af Amer: 76 mL/min — ABNORMAL LOW (ref >90)
GLUCOSE: 87 mg/dL (ref 70–99)
Potassium: 4.1 mEq/L (ref 3.7–5.3)
SODIUM: 141 meq/L (ref 137–147)

## 2013-12-10 LAB — MAGNESIUM: MAGNESIUM: 2.3 mg/dL (ref 1.5–2.5)

## 2013-12-10 MED ORDER — RISPERIDONE 0.25 MG PO TABS
0.2500 mg | ORAL_TABLET | Freq: Every day | ORAL | Status: DC
  Filled 2013-12-10 (×4): qty 1

## 2013-12-10 MED ORDER — LORAZEPAM 2 MG/ML IJ SOLN
0.2500 mg | Freq: Two times a day (BID) | INTRAMUSCULAR | Status: DC
  Administered 2013-12-10 – 2013-12-11 (×2): 0.25 mg via INTRAVENOUS
  Filled 2013-12-10 (×2): qty 1

## 2013-12-10 MED ORDER — POLYETHYLENE GLYCOL 3350 17 G PO PACK
17.0000 g | PACK | Freq: Every day | ORAL | Status: DC
  Administered 2013-12-12 – 2013-12-14 (×2): 17 g via ORAL
  Filled 2013-12-10 (×3): qty 1

## 2013-12-10 NOTE — Progress Notes (Addendum)
Physical Therapy Evaluation Patient Details Name: Elizabeth [name] MRN: [id number] DOB: [birth year] Today's Date: 12/10/2013   History of Present Illness  80 yo female found on floor by family with SOB, admitted with CHF and possible syncopal episode on 12/08/13.  Clinical Impression  Patient reports she is doing better, may be having hallucinations, reporting someone causing pain in her back and stomach, she believes they were outside last night with blue lights.  Agreeable to PT evaluation to try and relieve pain.  Minimal assistance physically for transfers and gait x 40', Increased assist required for Telemetry and O2 management.  Patient felt short of breath with activity, but did not desaturate overall (single episode of 84% while transferring to toilet).  Recommend 24 hour Supervision at this time, either SNF or home if nephew is able/willing.    Follow Up Recommendations Supervision/Assistance - 24 hour    Equipment Recommendations  None recommended by PT    Recommendations for Other Services       Precautions / Restrictions Precautions Precautions: Fall Restrictions Weight Bearing Restrictions: No      Mobility  Bed Mobility Overal bed mobility: Needs Assistance Bed Mobility: Supine to Sit     Supine to sit: Min assist        Transfers Overall transfer level: Needs assistance Equipment used: Rolling walker (2 wheeled) Transfers: Sit to/from Stand Sit to Stand: Min assist         General transfer comment: from low toilet  Ambulation/Gait Ambulation/Gait assistance: Min guard Ambulation Distance (Feet): 40 Feet Assistive device: Rolling walker (2 wheeled) Gait Pattern/deviations: Decreased stride length Gait velocity: reduced Gait velocity interpretation: Below normal speed for age/gender    Stairs Stairs:  (Not tested due to fatigue, patient reports she has stairs)          Wheelchair Mobility    Modified Rankin (Stroke Patients Only)        Balance Overall balance assessment: Needs assistance Sitting-balance support: Single extremity supported Sitting balance-Leahy Scale: Fair     Standing balance support: Bilateral upper extremity supported Standing balance-Leahy Scale: Fair                               Pertinent Vitals/Pain Pain Assessment: Faces Faces Pain Scale: Hurts little more Pain Location: stomach and back Pain Descriptors / Indicators:  (vibrating "someone is doing this to me".) Pain Intervention(s): Monitored during session;Repositioned;Relaxation  BP 133/56 (74) after activity; 115/63 (73) after rest.    Home Living Family/patient expects to be discharged to:: Skilled nursing facility                 Additional Comments: Recommend 24 hour mild assist, SNF or home with caregiver ok.    Prior Function Level of Independence: Needs assistance   Gait / Transfers Assistance Needed: pt states she uses RW, has w/c also.  ADL's / Homemaking Assistance Needed: nephew does all housework and driving, he assists pt with bathing        Hand Dominance        Extremity/Trunk Assessment   Upper Extremity Assessment: Overall WFL for tasks assessed;Generalized weakness           Lower Extremity Assessment: Overall WFL for tasks assessed;Generalized weakness      Cervical / Trunk Assessment: Kyphotic  Communication   Communication: HOH  Cognition Arousal/Alertness: Awake/alert Behavior During Therapy: WFL for tasks assessed/performed Overall Cognitive Status: No family/caregiver present to determine  baseline cognitive functioning (Grossly WFL, with possible hallucinations (blue lights))                      General Comments      Exercises        Assessment/Plan    PT Assessment All further PT needs can be met in the next venue of care;Patient needs continued PT services (Will maintain on PT caseload while in hospital.)  PT Diagnosis Generalized weakness   PT  Problem List Decreased strength;Decreased activity tolerance;Decreased mobility;Cardiopulmonary status limiting activity  PT Treatment Interventions     PT Goals (Current goals can be found in the Care Plan section) Acute Rehab PT Goals Patient Stated Goal: Find out who is causing stomach and back pain. PT Goal Formulation: With patient Time For Goal Achievement: 12/24/13 Potential to Achieve Goals: Good    Frequency Min 2X/week   Barriers to discharge Decreased caregiver support      Co-evaluation               End of Session Equipment Utilized During Treatment: Oxygen;Gait belt Activity Tolerance: Patient tolerated treatment well;Patient limited by fatigue Patient left: in chair;with call bell/phone within reach Nurse Communication: Mobility status         Time: 0915-1010 PT Time Calculation (min): 55 min   Charges:   PT Evaluation $Initial PT Evaluation Tier I: 1 Procedure PT Treatments $Gait Training: 8-22 mins $Therapeutic Activity: 8-22 mins   PT G Codes:          Annalisia Ingber L 2013-12-21, 10:22 AM

## 2013-12-10 NOTE — Progress Notes (Signed)
PT AMBULATED IN HALLWAY WITH WALKER OFF O2 SAO2 REMAIN ABOVE 91%. BACK TO ROOM PT C/O FEELING SOB BUT SAO2 91% O2 REAPPLIED AT 1L WILL CONTINUE TO MONITOR

## 2013-12-10 NOTE — Plan of Care (Signed)
Problem: Phase II Progression Outcomes Goal: Dyspnea controlled with activity Outcome: Completed/Met Date Met:  12/10/13 PT C/O OF SOB WITH AMBULATION, HOWEVER, RR-18, AND SAO2 99% ON R/A

## 2013-12-10 NOTE — Progress Notes (Signed)
PATIENT DETAILS Name: Elizabeth CarpenterKathleen M Hartsough Age: 80 y.o. Sex: female Date of Birth: 06-05-1923 Admit Date: 12/08/2013 Admitting Physician Lynden OxfordPranav Patel, MD ZOX:WRUEAV,WUJWJPCP:LITTLE,JAMES, MD  Subjective: Feels much better-claims she saw "blue lights" out of her window-"coming for me"  Assessment/Plan: Principal Problem:   Acute on chronic systolic congestive heart failure:last echo in December 2014 showed EF around 20-25%.Continue Lasix 40 mg daily, repeat echocardiogram on 11/6 confirms EF 20-25%. Follow potassium and renal function closely. Continue ACEI, will attempt to add Beta Blocker if BP permits.Cardiology consulted.  Active Problems:  Acute on Chronic Hypoxic Resp Failure:secondary to above, required BiPAP initially, now doing well on O2 via [place].Titrate off O2.     ?Syncope: not clear exactly whether patient passed out prior to this hospitalization. Given significantly low ejection fraction, VT remains a concern.Seen by cardiology, not a candidate for AICD.Continue to monitor in telemetry    Hypothyroidism: Continue with levothyroxine.    DEPRESSION/ANXIETY:resume lorazepam.    Mild Delirium:suspect mild dementia at baseline, add low dose risperidal QHS. Will follow    Essential hypertension, benign:controlled, continue with lisinopril and Lasix. Add low-dose beta blocker if BP permits.    Hypokalemia:Replete and recheck. Likely secondary to Lasix.    Frailty/deconditioning: PT eval.Suspect may need SNF  Disposition: Remain inpatient  Antibiotics:  None  DVT Prophylaxis: Prophylactic Heparin  Code Status:  DNR  Family Communication Tried Calling Nephew [name] to leave voicemail (not set up yet) on 11/6 and 11/7  Procedures:  None  CONSULTS:  cardiology  Time spent 40 minutes-which includes 50% of the time with face-to-face with patient/ family and coordinating care related to the above assessment and plan.   MEDICATIONS: Scheduled Meds: .  furosemide  40 mg Intravenous Daily  . heparin  5,000 Units Subcutaneous 3 times per day  . levothyroxine  100 mcg Oral QAC breakfast  . lisinopril  2.5 mg Oral Daily  . LORazepam  0.25 mg Oral BID  . sodium chloride  3 mL Intravenous Q12H   Continuous Infusions:  PRN Meds:.sodium chloride, acetaminophen, albuterol, ondansetron (ZOFRAN) IV, sodium chloride, traMADol  Antibiotics: Anti-infectives    None       PHYSICAL EXAM: Vital signs in last 24 hours: Filed Vitals:   12/09/13 2100 12/09/13 2344 12/10/13 0400 12/10/13 1008  BP:  100/43 100/54 115/63  Pulse: 68 69 78   Temp:  98 F (36.7 C) 97.9 F (36.6 C)   TempSrc:  Axillary Oral   Resp: 12 20 20    Height:      Weight:  50.758 kg (111 lb 14.4 oz) 50.758 kg (111 lb 14.4 oz)   SpO2: 99% 99% 100%     Weight change: 2.676 kg (5 lb 14.4 oz) Filed Weights   12/08/13 2017 12/09/13 2344 12/10/13 0400  Weight: 48.081 kg (106 lb) 50.758 kg (111 lb 14.4 oz) 50.758 kg (111 lb 14.4 oz)   Body mass index is 19.83 kg/(m^2).   Gen Exam: Awake and alert with clear speech.   Neck: Supple, No JVD.   Chest:Bibasilar rales-but with decreased air entry  CVS: S1 S2 Regular, 3/6 syst murmur.  Abdomen: soft, BS +, non tender, non distended.  Extremities: no edema, lower extremities warm to touch. Neurologic: Non Focal.   Skin: No Rash.   Wounds: N/A.   Intake/Output from previous day:  Intake/Output Summary (Last 24 hours) at 12/10/13 1015 Last data filed at 12/10/13 1009  Gross per 24 hour  Intake  606 ml  Output   1350 ml  Net   -744 ml     LAB RESULTS: CBC  Recent Labs Lab 12/08/13 2030 12/09/13 0533  WBC 9.9 5.3  HGB 13.4 11.2*  HCT 40.1 34.1*  PLT 140* 129*  MCV 95.7 95.3  MCH 32.0 31.3  MCHC 33.4 32.8  RDW 15.2 15.1  LYMPHSABS 1.2 0.9  MONOABS 0.5 0.4  EOSABS 0.0 0.0  BASOSABS 0.0 0.0    Chemistries   Recent Labs Lab 12/08/13 2030 12/09/13 0533 12/09/13 0945 12/10/13 0402  NA 137 140  --   141  K 4.0 3.3*  --  4.1  CL 98 101  --  101  CO2 27 28  --  31  GLUCOSE 164* 122*  --  87  BUN 14 13  --  18  CREATININE 0.58 0.51  --  0.67  CALCIUM 9.2 8.5  --  8.5  MG  --   --  1.8 2.3    CBG: No results for input(s): GLUCAP in the last 168 hours.  GFR Estimated Creatinine Clearance: 39 mL/min (by C-G formula based on Cr of 0.67).  Coagulation profile  Recent Labs Lab 12/09/13 0533  INR 1.04    Cardiac Enzymes  Recent Labs Lab 12/09/13 0533 12/09/13 0945 12/09/13 1710  TROPONINI <0.30 <0.30 <0.30    Invalid input(s): POCBNP No results for input(s): DDIMER in the last 72 hours. No results for input(s): HGBA1C in the last 72 hours. No results for input(s): CHOL, HDL, LDLCALC, TRIG, CHOLHDL, LDLDIRECT in the last 72 hours. No results for input(s): TSH, T4TOTAL, T3FREE, THYROIDAB in the last 72 hours.  Invalid input(s): FREET3 No results for input(s): VITAMINB12, FOLATE, FERRITIN, TIBC, IRON, RETICCTPCT in the last 72 hours. No results for input(s): LIPASE, AMYLASE in the last 72 hours.  Urine Studies No results for input(s): UHGB, CRYS in the last 72 hours.  Invalid input(s): UACOL, UAPR, USPG, UPH, UTP, UGL, UKET, UBIL, UNIT, UROB, ULEU, UEPI, UWBC, URBC, UBAC, CAST, UCOM, BILUA  MICROBIOLOGY: Recent Results (from the past 240 hour(s))  MRSA PCR Screening     Status: None   Collection Time: 12/09/13  6:47 PM  Result Value Ref Range Status   MRSA by PCR NEGATIVE NEGATIVE Final    Comment:        The GeneXpert MRSA Assay (FDA approved for NASAL specimens only), is one component of a comprehensive MRSA colonization surveillance program. It is not intended to diagnose MRSA infection nor to guide or monitor treatment for MRSA infections.     RADIOLOGY STUDIES/RESULTS: Dg Chest Port 1 View  12/08/2013   CLINICAL DATA:  Shortness of breath  EXAM: PORTABLE CHEST - 1 VIEW  COMPARISON:  01/20/2013  FINDINGS: There is chronic cardiomegaly. Enlargement of  the hila, likely vascular congestion. Negative aortic contours.  Diffuse interstitial opacity with Kerley lines. Haziness of the lower chest consistent with either atelectasis and/or pleural fluid. There is a large gas-filled hiatal hernia.  No evidence of pneumothorax.  IMPRESSION: 1. Pulmonary edema, pleural effusions, and basilar atelectasis. These changes could obscure pneumonia. 2. Large hiatal hernia.   Electronically Signed   By: Tiburcio PeaJonathan  Watts M.D.   On: 12/08/2013 21:09    Jeoffrey MassedGHIMIRE,SHANKER, MD  [facility] Pager:[phone]  If 7PM-7AM, please contact night-coverage [email or web address] Password [email or web address] 12/10/2013, 10:15 AM   LOS: 2 days

## 2013-12-10 NOTE — Progress Notes (Signed)
SPOKE WITH NEPHEW, PT ORDERED 2L O2 AT HOME BUT IS NOT VERY COMPLIANT WITH IT AND OFTEN GOES WITHOUT O2. O2 DECREASED TO 1L WILL MONITOR

## 2013-12-10 NOTE — Plan of Care (Signed)
Problem: Phase II Progression Outcomes Goal: Pain controlled Outcome: Completed/Met Date Met:  12/10/13 Goal: Dyspnea controlled with activity Outcome: Progressing Goal: Walk in hall or up in chair TID Outcome: Not Applicable Date Met:  12/10/13 Goal: Discharge plan established Outcome: Progressing Goal: Tolerating diet Outcome: Completed/Met Date Met:  12/10/13 Goal: Fluid volume status improved Outcome: Completed/Met Date Met:  12/10/13 Goal: Case manager referral Outcome: Progressing Goal: Begin discharge teaching Outcome: Progressing Goal: Other Phase II Outcomes/Goals Outcome: Progressing     
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
## 2013-12-10 NOTE — Progress Notes (Signed)
Abd distented this evening, Xray confirms ileus, will place NGT, keep NPO.

## 2013-12-10 NOTE — Progress Notes (Signed)
NG tube placement attempted, left nare had small scab with scant amount of blood on tubing, Right nare attempted x 1, patient anxious and refusing NG tube placement. NP. Benedetto Coons Callahan made aware, ordered to contact POA nephew Elizabeth [name]. Informed nephew  of NG tube placement and the benefit of it. Nephew will come to hospital to talk with Mrs. Elizabeth [name] and possibly allow another attempt at NG tube placement. Will continue to monitor patient at this time.

## 2013-12-11 ENCOUNTER — Inpatient Hospital Stay (HOSPITAL_COMMUNITY): Payer: Medicare Other

## 2013-12-11 DIAGNOSIS — K567 Ileus, unspecified: Secondary | ICD-10-CM

## 2013-12-11 LAB — BASIC METABOLIC PANEL
ANION GAP: 11 (ref 5–15)
BUN: 18 mg/dL (ref 6–23)
CO2: 29 mEq/L (ref 19–32)
Calcium: 8.7 mg/dL (ref 8.4–10.5)
Chloride: 100 mEq/L (ref 96–112)
Creatinine, Ser: 0.56 mg/dL (ref 0.50–1.10)
GFR calc non Af Amer: 81 mL/min — ABNORMAL LOW (ref >90)
Glucose, Bld: 81 mg/dL (ref 70–99)
Potassium: 4.4 mEq/L (ref 3.7–5.3)
Sodium: 140 mEq/L (ref 137–147)

## 2013-12-11 MED ORDER — PHENOL 1.4 % MT LIQD
1.0000 | OROMUCOSAL | Status: DC | PRN
  Administered 2013-12-11 – 2013-12-12 (×10): 1 via OROMUCOSAL
  Filled 2013-12-11: qty 177

## 2013-12-11 MED ORDER — FLEET ENEMA 7-19 GM/118ML RE ENEM
1.0000 | ENEMA | Freq: Once | RECTAL | Status: AC
  Administered 2013-12-11: 1 via RECTAL
  Filled 2013-12-11: qty 1

## 2013-12-11 MED ORDER — FUROSEMIDE 10 MG/ML IJ SOLN
20.0000 mg | Freq: Every day | INTRAMUSCULAR | Status: DC
  Administered 2013-12-11 – 2013-12-12 (×2): 20 mg via INTRAVENOUS
  Filled 2013-12-11 (×2): qty 2

## 2013-12-11 MED ORDER — LEVOTHYROXINE SODIUM 100 MCG IV SOLR
50.0000 ug | Freq: Every day | INTRAVENOUS | Status: DC
  Administered 2013-12-11 – 2013-12-12 (×2): 50 ug via INTRAVENOUS
  Filled 2013-12-11 (×3): qty 5

## 2013-12-11 MED ORDER — LORAZEPAM 2 MG/ML IJ SOLN
0.2500 mg | Freq: Two times a day (BID) | INTRAMUSCULAR | Status: DC
  Administered 2013-12-11 – 2013-12-12 (×4): 0.25 mg via INTRAVENOUS
  Filled 2013-12-11 (×4): qty 1

## 2013-12-11 NOTE — Plan of Care (Signed)
Problem: Phase I Progression Outcomes Goal: Pain controlled with appropriate interventions Outcome: Completed/Met Date Met:  12/11/13 Goal: OOB as tolerated unless otherwise ordered Outcome: Completed/Met Date Met:  12/11/13 Goal: Initial discharge plan identified Outcome: Completed/Met Date Met:  12/11/13 Goal: Voiding-avoid urinary catheter unless indicated Outcome: Completed/Met Date Met:  12/11/13 Goal: Hemodynamically stable Outcome: Completed/Met Date Met:  12/11/13     

## 2013-12-11 NOTE — Progress Notes (Signed)
PATIENT DETAILS Name: Elizabeth [name] Age: 80 y.o. Sex: female Date of Birth: 10/03/1923 Admit Date: 12/08/2013 Admitting Physician Lynden Oxford, MD WUJ:WJXBJY,NWGNF, MD  Subjective: Abdomen still distended-NGT in place.Wants to eat  Assessment/Plan: Principal Problem:   Acute on chronic systolic congestive heart failure:last echo in December 2014 showed EF around 20-25%.Continue Lasix but will decrease dose to 20 daily, repeat echocardiogram on 11/6 confirms EF 20-25%. Follow potassium and renal function closely. Continue ACEI once oral intake resumes, will attempt to add Beta Blocker if BP permits.Cardiology consulted.  Active Problems: Ileus:developed abdominal distention on 11/7-abdominal x-ray confirmed ileus. NG tube placed, keep nothing by mouth. Will try 1 dose of enema. Supportive care, if any further deterioration, we will need to get family involved and transition to comfort care. Not a candidate for any aggressive measures.  Acute on Chronic Hypoxic Resp Failure:secondary to above, required BiPAP initially, now doing well on O2 via .Titrate off O2.     ?Syncope: not clear exactly whether patient passed out prior to this hospitalization. Given significantly low ejection fraction, VT remains a concern.Seen by cardiology, not a candidate for AICD.Continue to monitor in telemetry    Hypothyroidism: Continue with levothyroxine-but will change to IV    DEPRESSION/ANXIETY:resume lorazepam-but will change to IV    Mild Delirium:suspect mild dementia at baseline, add low dose risperidal QHS. Will follow    Essential hypertension, benign:controlled, Hold lisinopril as NPO, on IV Lasix. Add low-dose beta blocker if BP permits.    Hypokalemia:Replete and recheck. Likely secondary to Lasix.    Frailty/deconditioning: PT eval.Suspect may need SNF with Hospice follow up  Disposition: Remain inpatient  Antibiotics:  None  DVT Prophylaxis: Prophylactic Heparin  Code  Status:  DNR  Family Communication Spoke with Aurther Loft [phone] 11/7-he is agreeable with the above noted plan. Does not want any awake measures. DO NOT RESUSCITATE confirmed. He is agreeable to SNF with hospice follow-up  Procedures:  None  CONSULTS:  cardiology  Time spent 40 minutes-which includes 50% of the time with face-to-face with patient/ family and coordinating care related to the above assessment and plan.   MEDICATIONS: Scheduled Meds: . furosemide  20 mg Intravenous Daily  . heparin  5,000 Units Subcutaneous 3 times per day  . levothyroxine  50 mcg Intravenous Daily  . LORazepam  0.25 mg Intravenous Q12H  . polyethylene glycol  17 g Oral Daily  . risperiDONE  0.25 mg Oral QHS  . sodium chloride  3 mL Intravenous Q12H  . sodium phosphate  1 enema Rectal Once   Continuous Infusions:  PRN Meds:.sodium chloride, acetaminophen, albuterol, ondansetron (ZOFRAN) IV, phenol, sodium chloride  Antibiotics: Anti-infectives    None       PHYSICAL EXAM: Vital signs in last 24 hours: Filed Vitals:   12/11/13 0613 12/11/13 0614 12/11/13 0615 12/11/13 0616  BP:      Pulse: 82 77 80 82  Temp:      TempSrc:      Resp: 15 19 18 21   Height:      Weight:      SpO2: 94% 94% 95% 94%    Weight change: 0.045 kg (1.6 oz) Filed Weights   12/09/13 2344 12/10/13 0400 12/11/13 0400  Weight: 50.758 kg (111 lb 14.4 oz) 50.758 kg (111 lb 14.4 oz) 50.803 kg (112 lb)   Body mass index is 19.84 kg/(m^2).   Gen Exam: Awake and alert with clear speech.   Neck: Supple, No  JVD.   Chest:Bibasilar rales-but with decreased air entry  CVS: S1 S2 Regular, 3/6 syst murmur.  Abdomen: soft, BS +, + distended but non tender Extremities: no edema, lower extremities warm to touch. Neurologic: Non Focal.   Skin: No Rash.   Wounds: N/A.   Intake/Output from previous day:  Intake/Output Summary (Last 24 hours) at 12/11/13 1020 Last data filed at 12/11/13 0856  Gross per 24 hour   Intake    260 ml  Output    752 ml  Net   -492 ml     LAB RESULTS: CBC  Recent Labs Lab 12/08/13 2030 12/09/13 0533  WBC 9.9 5.3  HGB 13.4 11.2*  HCT 40.1 34.1*  PLT 140* 129*  MCV 95.7 95.3  MCH 32.0 31.3  MCHC 33.4 32.8  RDW 15.2 15.1  LYMPHSABS 1.2 0.9  MONOABS 0.5 0.4  EOSABS 0.0 0.0  BASOSABS 0.0 0.0    Chemistries   Recent Labs Lab 12/08/13 2030 12/09/13 0533 12/09/13 0945 12/10/13 0402 12/11/13 0345  NA 137 140  --  141 140  K 4.0 3.3*  --  4.1 4.4  CL 98 101  --  101 100  CO2 27 28  --  31 29  GLUCOSE 164* 122*  --  87 81  BUN 14 13  --  18 18  CREATININE 0.58 0.51  --  0.67 0.56  CALCIUM 9.2 8.5  --  8.5 8.7  MG  --   --  1.8 2.3  --     CBG: No results for input(s): GLUCAP in the last 168 hours.  GFR Estimated Creatinine Clearance: 39 mL/min (by C-G formula based on Cr of 0.56).  Coagulation profile  Recent Labs Lab 12/09/13 0533  INR 1.04    Cardiac Enzymes  Recent Labs Lab 12/09/13 0533 12/09/13 0945 12/09/13 1710  TROPONINI <0.30 <0.30 <0.30    Invalid input(s): POCBNP No results for input(s): DDIMER in the last 72 hours. No results for input(s): HGBA1C in the last 72 hours. No results for input(s): CHOL, HDL, LDLCALC, TRIG, CHOLHDL, LDLDIRECT in the last 72 hours. No results for input(s): TSH, T4TOTAL, T3FREE, THYROIDAB in the last 72 hours.  Invalid input(s): FREET3 No results for input(s): VITAMINB12, FOLATE, FERRITIN, TIBC, IRON, RETICCTPCT in the last 72 hours. No results for input(s): LIPASE, AMYLASE in the last 72 hours.  Urine Studies No results for input(s): UHGB, CRYS in the last 72 hours.  Invalid input(s): UACOL, UAPR, USPG, UPH, UTP, UGL, UKET, UBIL, UNIT, UROB, ULEU, UEPI, UWBC, URBC, UBAC, CAST, UCOM, BILUA  MICROBIOLOGY: Recent Results (from the past 240 hour(s))  MRSA PCR Screening     Status: None   Collection Time: 12/09/13  6:47 PM  Result Value Ref Range Status   MRSA by PCR NEGATIVE  NEGATIVE Final    Comment:        The GeneXpert MRSA Assay (FDA approved for NASAL specimens only), is one component of a comprehensive MRSA colonization surveillance program. It is not intended to diagnose MRSA infection nor to guide or monitor treatment for MRSA infections.     RADIOLOGY STUDIES/RESULTS: Dg Chest Port 1 View  12/08/2013   CLINICAL DATA:  Shortness of breath  EXAM: PORTABLE CHEST - 1 VIEW  COMPARISON:  01/20/2013  FINDINGS: There is chronic cardiomegaly. Enlargement of the hila, likely vascular congestion. Negative aortic contours.  Diffuse interstitial opacity with Kerley lines. Haziness of the lower chest consistent with either atelectasis and/or pleural fluid. There is  a large gas-filled hiatal hernia.  No evidence of pneumothorax.  IMPRESSION: 1. Pulmonary edema, pleural effusions, and basilar atelectasis. These changes could obscure pneumonia. 2. Large hiatal hernia.   Electronically Signed   By: Tiburcio PeaJonathan  Watts M.D.   On: 12/08/2013 21:09   Dg Abd 2 Views  12/11/2013   CLINICAL DATA:  Ileus, diffuse abdominal pain and distension  EXAM: ABDOMEN - 2 VIEW  COMPARISON:  12/10/2013  FINDINGS: Nasogastric tube tip terminates below the level of the hemidiaphragms but is not included in the field of view. Moderate enlargement of the cardiomediastinal silhouette reidentified. Left basilar atelectasis or consolidation re- noted. Multiple differential air-fluid levels are identified with increased gaseous distention of multiple loops of bowel predominantly over the lower abdomen. Calcified presumed fibroid reidentified. Dynamic left femoral nail again noted.  IMPRESSION: Increased gaseous bowel distention and a pattern typical for ileus.   Electronically Signed   By: Christiana PellantGretchen  Green M.D.   On: 12/11/2013 08:17   Dg Abd Acute W/chest  12/10/2013   CLINICAL DATA:  Abdominal distention.  EXAM: ACUTE ABDOMEN SERIES (ABDOMEN 2 VIEW & CHEST 1 VIEW)  COMPARISON:  08/07/2013.  FINDINGS:  Diffuse increased bowel gas with no significant bowel distention. There are air-fluid levels on the decubitus view. No free air.  Calcification in the pelvis is consistent with fibroids. This is stable.  There has been a cholecystectomy and a ORIF of a left proximal femur fracture.  Frontal chest radiograph shows perihilar and hazy lung base opacity, improved from the study dated 12/08/2013. This is consistent with improved pulmonary edema. Cardiac silhouette is mildly enlarged. Small effusions are evident.  IMPRESSION: 1. Increased bowel gas diffusely, with multiple air-fluid levels. This is most suggestive of an adynamic ileus. There is significant bowel distention. No convincing obstruction. No free air. 2. Improved pulmonary edema since the prior chest radiograph.   Electronically Signed   By: Amie Portlandavid  Ormond M.D.   On: 12/10/2013 18:42   Dg Abd Portable 1v  12/10/2013   CLINICAL DATA:  NG tube placement  EXAM: PORTABLE ABDOMEN - 1 VIEW  COMPARISON:  12/10/2013  FINDINGS: Enteric tube tip projects over the left upper quadrant consistent with location in the upper stomach. The proximal side hole data is over the area of the EG junction. Probable left pleural effusion. Cardiac enlargement. Gas-filled bowel loops are incompletely visualized. Suggestion is ileus.  IMPRESSION: Enteric tube tip projects over the upper stomach with proximal side hole over the EG junction.   Electronically Signed   By: Burman NievesWilliam  Stevens M.D.   On: 12/10/2013 22:27    Jeoffrey MassedGHIMIRE,SHANKER, MD  [facility] Pager:[phone]  If 7PM-7AM, please contact night-coverage [email or web address] Password [email or web address] 12/11/2013, 10:20 AM   LOS: 3 days

## 2013-12-11 NOTE — Progress Notes (Signed)
Patient ID: Elizabeth CarpenterKathleen M [name], female   DOB: February 01, 1924, 80 y.o.   MRN: [id number]    Subjective:  Less confused no dyspnea  Abdominal distension  With NG tube   Objective:  Filed Vitals:   12/11/13 0613 12/11/13 0614 12/11/13 0615 12/11/13 0616  BP:      Pulse: 82 77 80 82  Temp:      TempSrc:      Resp: 15 19 18 21   Height:      Weight:      SpO2: 94% 94% 95% 94%   BP 146/70   Intake/Output from previous day:  Intake/Output Summary (Last 24 hours) at 12/11/13 0858 Last data filed at 12/11/13 0644  Gross per 24 hour  Intake    263 ml  Output    652 ml  Net   -389 ml    Physical Exam: Affect appropriate Frail elderly female  HEENT: normal Neck supple with no adenopathy JVP normal no bruits no thyromegaly Lungs basilar atelectasis  no wheezing and good diaphragmatic motion Heart:  S1/S2 SEM  murmur, no rub, gallop or click PMI normal Abdomen: benighn, BS positve, no tenderness, no AAA no bruit.  No HSM or HJR Distal pulses intact with no bruits No edema Neuro non-focal Skin warm and dry No muscular weakness   Lab Results: Basic Metabolic Panel:  Recent Labs  78/29/5609/07/18 0945 12/10/13 0402 12/11/13 0345  NA  --  141 140  K  --  4.1 4.4  CL  --  101 100  CO2  --  31 29  GLUCOSE  --  87 81  BUN  --  18 18  CREATININE  --  0.67 0.56  CALCIUM  --  8.5 8.7  MG 1.8 2.3  --    Liver Function Tests:  Recent Labs  12/09/13 0533  AST 17  ALT 10  ALKPHOS 86  BILITOT 0.5  PROT 5.3*  ALBUMIN 3.0*   CBC:  Recent Labs  12/08/13 2030 12/09/13 0533  WBC 9.9 5.3  NEUTROABS 8.2* 3.9  HGB 13.4 11.2*  HCT 40.1 34.1*  MCV 95.7 95.3  PLT 140* 129*   Cardiac Enzymes:  Recent Labs  12/09/13 0533 12/09/13 0945 12/09/13 1710  TROPONINI <0.30 <0.30 <0.30    Imaging: Dg Abd 2 Views  12/11/2013   CLINICAL DATA:  Ileus, diffuse abdominal pain and distension  EXAM: ABDOMEN - 2 VIEW  COMPARISON:  12/10/2013  FINDINGS: Nasogastric tube tip terminates  below the level of the hemidiaphragms but is not included in the field of view. Moderate enlargement of the cardiomediastinal silhouette reidentified. Left basilar atelectasis or consolidation re- noted. Multiple differential air-fluid levels are identified with increased gaseous distention of multiple loops of bowel predominantly over the lower abdomen. Calcified presumed fibroid reidentified. Dynamic left femoral nail again noted.  IMPRESSION: Increased gaseous bowel distention and a pattern typical for ileus.   Electronically Signed   By: Christiana PellantGretchen  Green M.D.   On: 12/11/2013 08:17   Dg Abd Acute W/chest  12/10/2013   CLINICAL DATA:  Abdominal distention.  EXAM: ACUTE ABDOMEN SERIES (ABDOMEN 2 VIEW & CHEST 1 VIEW)  COMPARISON:  08/07/2013.  FINDINGS: Diffuse increased bowel gas with no significant bowel distention. There are air-fluid levels on the decubitus view. No free air.  Calcification in the pelvis is consistent with fibroids. This is stable.  There has been a cholecystectomy and a ORIF of a left proximal femur fracture.  Frontal chest radiograph shows perihilar and  hazy lung base opacity, improved from the study dated 12/08/2013. This is consistent with improved pulmonary edema. Cardiac silhouette is mildly enlarged. Small effusions are evident.  IMPRESSION: 1. Increased bowel gas diffusely, with multiple air-fluid levels. This is most suggestive of an adynamic ileus. There is significant bowel distention. No convincing obstruction. No free air. 2. Improved pulmonary edema since the prior chest radiograph.   Electronically Signed   By: Amie Portlandavid  Ormond M.D.   On: 12/10/2013 18:42   Dg Abd Portable 1v  12/10/2013   CLINICAL DATA:  NG tube placement  EXAM: PORTABLE ABDOMEN - 1 VIEW  COMPARISON:  12/10/2013  FINDINGS: Enteric tube tip projects over the left upper quadrant consistent with location in the upper stomach. The proximal side hole data is over the area of the EG junction. Probable left pleural  effusion. Cardiac enlargement. Gas-filled bowel loops are incompletely visualized. Suggestion is ileus.  IMPRESSION: Enteric tube tip projects over the upper stomach with proximal side hole over the EG junction.   Electronically Signed   By: Burman NievesWilliam  Stevens M.D.   On: 12/10/2013 22:27    Cardiac Studies:  ECG:  SR LVH with strain PVC    Telemetry:  NSR  10 beats of NSVT   Echo:  11/6  EF 20-25%  No real change from 12/14  Reviewed   Medications:   . furosemide  40 mg Intravenous Daily  . heparin  5,000 Units Subcutaneous 3 times per day  . levothyroxine  100 mcg Oral QAC breakfast  . lisinopril  2.5 mg Oral Daily  . LORazepam  0.25 mg Intravenous Q12H  . polyethylene glycol  17 g Oral Daily  . risperiDONE  0.25 mg Oral QHS  . sodium chloride  3 mL Intravenous Q12H       Assessment/Plan:  CHF:  Continue lasix  Change to PO No peripheral edema.  She is not a candidate for aggressive w/u or intervention  DNR and advanced age .  Able to walk with PT this am using walker She needs to where her oxygen at home HTN:  Continue ACE Confusion:  May need haldol  Per IM  Thyroid:  Continue replacement  TSH 4.2 in July Ileus:  NG tube placed    Will sign off   Charlton Hawseter Airik Goodlin 12/11/2013, 8:58 AM

## 2013-12-11 NOTE — Progress Notes (Signed)
Patient and family agreed to NG tube placement. NG tube placed, pt tolerated well. Verified placement through ascultation, abdominal xray and NP T. Claiborne Billingsallahan notified. Patient connected to low intermittent suction. Will continue to monitor patient.

## 2013-12-11 NOTE — Progress Notes (Signed)
Patient had 10 beat run of V-tach, asymptomatic. Patient awaken from sleep, denies any complications. NP T.Claiborne BillingsCallahan made aware, no new orders given. Will continue to monitor patient.

## 2013-12-12 ENCOUNTER — Inpatient Hospital Stay (HOSPITAL_COMMUNITY): Payer: Medicare Other

## 2013-12-12 LAB — BASIC METABOLIC PANEL
ANION GAP: 12 (ref 5–15)
BUN: 14 mg/dL (ref 6–23)
CHLORIDE: 100 meq/L (ref 96–112)
CO2: 29 mEq/L (ref 19–32)
Calcium: 8.7 mg/dL (ref 8.4–10.5)
Creatinine, Ser: 0.54 mg/dL (ref 0.50–1.10)
GFR calc non Af Amer: 82 mL/min — ABNORMAL LOW (ref >90)
Glucose, Bld: 77 mg/dL (ref 70–99)
POTASSIUM: 4 meq/L (ref 3.7–5.3)
Sodium: 141 mEq/L (ref 137–147)

## 2013-12-12 MED ORDER — METOCLOPRAMIDE HCL 5 MG/ML IJ SOLN
5.0000 mg | Freq: Two times a day (BID) | INTRAMUSCULAR | Status: DC
  Administered 2013-12-12 (×2): 5 mg via INTRAVENOUS
  Filled 2013-12-12 (×2): qty 2

## 2013-12-12 MED ORDER — FLEET ENEMA 7-19 GM/118ML RE ENEM
1.0000 | ENEMA | Freq: Once | RECTAL | Status: AC
  Administered 2013-12-12: 1 via RECTAL
  Filled 2013-12-12: qty 1

## 2013-12-12 MED ORDER — MORPHINE SULFATE 2 MG/ML IJ SOLN
0.5000 mg | INTRAMUSCULAR | Status: DC | PRN
  Administered 2013-12-12 – 2013-12-13 (×2): 0.5 mg via INTRAVENOUS
  Filled 2013-12-12 (×2): qty 1

## 2013-12-12 MED ORDER — CETYLPYRIDINIUM CHLORIDE 0.05 % MT LIQD
7.0000 mL | Freq: Two times a day (BID) | OROMUCOSAL | Status: DC
  Administered 2013-12-12 – 2013-12-14 (×4): 7 mL via OROMUCOSAL

## 2013-12-12 NOTE — Clinical Social Work Placement (Addendum)
    Clinical Social Work Department CLINICAL SOCIAL WORK PLACEMENT NOTE 12/12/2013  Patient:  Elizabeth [name],Elizabeth [name]  Account Number:  [id number] Admit date:  12/08/2013  Clinical Social Worker:  Sharol HarnessPOONUM Viki Carrera, Theresia MajorsLCSWA  Date/time:  12/12/2013 02:39 PM  Clinical Social Work is seeking post-discharge placement for this patient at the following level of care:   SKILLED NURSING   (*CSW will update this form in Epic as items are completed)   12/12/2013  [facility]/family provided with [facility] Department of Clinical Social Work's list of facilities offering this level of care within the geographic area requested by the patient (or if unable, by the patient's family).  12/12/2013  Patient/family informed of their freedom to choose among providers that offer the needed level of care, that participate in Medicare, Medicaid or managed care program needed by the patient, have an available bed and are willing to accept the patient.  12/12/2013  Patient/family informed of [facility]' ownership interest in [facility], as well as of the fact that they are under no obligation to receive care at this facility.  PASARR submitted to EDS on Existing PASARR number received on   FL2 transmitted to all facilities in geographic area requested by pt/family on  12/12/2013 FL2 transmitted to all facilities within larger geographic area on   Patient informed that his/her managed care company has contracts with or will negotiate with  certain facilities, including the following:     Patient/family informed of bed offers received:  12/14/2013 [facility] Physician recommends and patient chooses bed at    [facility] on  12/14/2013 Patient to be transferred to facility by [facility] Patient and family notified of transfer on 12/14/2013 Name of family member notified:  Celene SkeenAlex [name] (nephew)  The following physician request were  entered in Epic: Physician Request  Please sign FL2.    Additional CommentsSharol Harness:  Shamira Toutant, LCSWA [phone]

## 2013-12-12 NOTE — Progress Notes (Signed)
Pt c/o of sob. Pt placed on 2L via [place]. Pt's sats were in the 90s prior to O2 placement & went up to 100 after O2 placement. Pt did not look to be in any distress. VS stable & charted. Sanda LingerMilam, Troi Bechtold R, RN

## 2013-12-12 NOTE — Clinical Social Work Psychosocial (Signed)
     Clinical Social Work Department BRIEF PSYCHOSOCIAL ASSESSMENT 12/12/2013  Patient:  Elizabeth [name],Elizabeth [name]     Account Number:  [id number]     Admit date:  12/08/2013  Clinical Social Worker:  Harless NakayamaAMBELAL,Tynika Luddy, LCSWA  Date/Time:  12/12/2013 02:35 PM  Referred by:  Physician  Date Referred:  12/12/2013 Referred for  SNF Placement   Other Referral:   Interview type:  Family Other interview type:   Spoke with pt nephew over the phone    PSYCHOSOCIAL DATA Living Status:  FAMILY Admitted from facility:   Level of care:   Primary support name:  Celene Skeenlex [name] Primary support relationship to patient:  FAMILY Degree of support available:   Pt has good family support and resides with her [place] [name].    CURRENT CONCERNS Current Concerns  Post-Acute Placement   Other Concerns:    SOCIAL WORK ASSESSMENT / PLAN CSW made aware that pt will need SNF at dc. CSW spoke with pt nephew Trinna Postlex and he informed CSW that pt lives with him but he works and is unable to provide 24 hr supervision. [facility]. He is agreeable to [facility]. He did express a few preferences but informed CSW he would like to look at the list of available bed offers before making any decisions. He did also express facilities he would not want pt to dc too. [name] informed CSW he is also aware that palliative will need to follow pt in the SNF. [name] informed CSW he did not have any questions at this time. His main concern is finding a facility that will provide pt with what she needs.   Assessment/plan status:  Psychosocial Support/Ongoing Assessment of Needs Other assessment/ plan:   Information/referral to community resources:   SNF list to be provided with bed offers    PATIENTS/FAMILYS RESPONSE TO PLAN OF CARE: Pt nephew agreeable to SNF at dc.    Dreshon Proffit, LCSWA  [phone]

## 2013-12-12 NOTE — Progress Notes (Signed)
Physical Therapy Treatment Patient Details Name: Elizabeth CarpenterKathleen M [name] MRN: [id number] DOB: [birth year] Today's Date: 12/12/2013    History of Present Illness 80 yo female found on floor by family with SOB, admitted with CHF and possible syncopal episode on 12/08/13.    PT Comments    Pt currently with functional limitations due to decreased strength, mobility, balance and safety awareness. Pt will benefit from skilled PT to increase independence and safety with mobility to allow discharge to SNF. Pt was very adamant that she did not want to go to a SNF upon discharge and wants a nurse to come to her home to provide care for her but is not able to pay for this. PT talked to case manager who stated plan was for Pt to be discharged to a SNF per her nephew's agreement, who is her care giver. PT agrees discharge to a SNF is most appropriate choice for Pt but if she refuses this then maximum home health is needed. Pt was found with severe urinary incontinence and PT does not believe Pt would be able to perform these ADLs safely at home, especially in the absence of 24 care/supervision.     Follow Up Recommendations  SNF     Equipment Recommendations  Other (comment) (TBD)    Recommendations for Other Services       Precautions / Restrictions Precautions Precautions: Fall Restrictions Weight Bearing Restrictions: No    Mobility  Bed Mobility Overal bed mobility: Needs Assistance Bed Mobility: Supine to Sit     Supine to sit: Min assist     General bed mobility comments: Pt able to roll to side and initiate sitting up from supine EOB elevated. Pt required assistance bringing trunk to upright and scoot forward in bed to get feet on the ground.    Transfers Overall transfer level: Needs assistance Equipment used: None Transfers: Stand Pivot Transfers;Sit to/from Stand Sit to Stand: Min assist Stand pivot transfers: Min guard       General transfer comment: Pt able to stand up from  bed and pivot to sitting in chair with minimal physical assistance. Pt was very hunched forward while performing this transfer. Pt required increased assistance when standing up from recliner chair.   Ambulation/Gait             General Gait Details: Pt unwilling to ambulate during today's session   Stairs            Wheelchair Mobility    Modified Rankin (Stroke Patients Only)       Balance Overall balance assessment: Needs assistance Sitting-balance support: Feet supported;Bilateral upper extremity supported Sitting balance-Leahy Scale: Fair                              Cognition Arousal/Alertness: Awake/alert Behavior During Therapy: WFL for tasks assessed/performed Overall Cognitive Status: Within Functional Limits for tasks assessed                      Exercises      General Comments General comments (skin integrity, edema, etc.): Pt found to have severe urinary incontinence upon arrival. Pt was cleaned, dried and given a new gown. Pt was educated on the importance of calling her nurse when she knows she has been incontinent. Pt's skin appeared fine.       Pertinent Vitals/Pain      Home Living  Prior Function            PT Goals (current goals can now be found in the care plan section) Progress towards PT goals: Not progressing toward goals - comment (Pt unwilling to ambulate during today's session)    Frequency  Min 3X/week    PT Plan  Continue current PT plan    Co-evaluation             End of Session Equipment Utilized During Treatment: Gait belt Activity Tolerance: Patient tolerated treatment well;Other (comment) (Pt unwilling to do anything else) Patient left: in chair;with chair alarm set;with call bell/phone within reach     Time: 1446-1513 PT Time Calculation (min): 27 min  Charges:  $Therapeutic Activity: 8-22 mins $Self Care/Home Management: 8-22                    G  CodesYork [name]:      Elizabeth [name], Elizabeth [name], Elizabeth [name] 12/12/2013, 4:37 PM   Elizabeth [name], Elizabeth [name]  Acute Rehabilitation [phone]

## 2013-12-12 NOTE — Progress Notes (Addendum)
PATIENT DETAILS Name: Elizabeth [name] Age: 80 y.o. Sex: female Date of Birth: [birth year] Admit Date: 12/08/2013 Admitting Physician Lynden Oxford, MD ZOX:WRUEAV,WUJWJ, MD  Subjective: Desperately wants to eat-"I can eat a horse". Had BM yesterday. Less abdominal distention.  Assessment/Plan: Principal Problem:   Acute on chronic systolic congestive heart failure:last echo in December 2014 showed EF around 20-25%.Continue Lasix, dose decrease dose to 20 daily as NPO, repeat echocardiogram on 11/6 confirms EF 20-25%. Follow potassium and renal function closely. Continue ACEI once oral intake resumes, will attempt to add Beta Blocker if BP permits.Cardiology consulted.  Active Problems: Ileus:developed abdominal distention on 11/7-abdominal x-ray confirmed ileus. NG tube placed, keep nothing by mouth. Improved today, patient wanting to eat, will repeat enema today, clamp NG, and hopefully will be able to remove and start clears later today. Not a candidate for any aggressive measures, if deteriorates, then will transition to comfort measures.  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     ?Syncope: not clear exactly whether patient passed out prior to this hospitalization. Given significantly low ejection fraction, VT remains a concern.Seen by cardiology, not a candidate for AICD.Continue to monitor in telemetry    Hypothyroidism: Continue with levothyroxine-but will change to IV as NPO    DEPRESSION/ANXIETY:resume lorazepam-but will change to IV as NPO    Mild Delirium:suspect mild dementia at baseline, add low dose risperidal QHS. Stable-continues to have intermittent visual hallucinations.    Essential hypertension, benign:controlled, Hold lisinopril as NPO, on IV Lasix. Add low-dose beta blocker if BP permits.    Hypokalemia:Replete and recheck. Likely secondary to Lasix.   Frailty/deconditioning: PT eval.Suspect may need SNF with Hospice follow up  Disposition: Remain inpatient  Antibiotics:  None  DVT Prophylaxis: Prophylactic Heparin  Code Status:  DNR  Family Communication Spoke with Aurther Loft [phone] 11/7-he is agreeable with the above noted plan. Does not want any heroic measures. DO NOT RESUSCITATE confirmed. He is agreeable to SNF with hospice follow-up  Procedures:  None  CONSULTS:  cardiology   MEDICATIONS: Scheduled Meds: . furosemide  20 mg Intravenous Daily  . heparin  5,000 Units Subcutaneous 3 times per day  . levothyroxine  50 mcg Intravenous Daily  . LORazepam  0.25 mg Intravenous Q12H  . polyethylene glycol  17 g Oral Daily  . risperiDONE  0.25 mg Oral QHS  . sodium chloride  3 mL Intravenous Q12H  . sodium phosphate  1 enema Rectal Once   Continuous Infusions:  PRN Meds:.sodium chloride, acetaminophen, albuterol, ondansetron (ZOFRAN) IV, phenol, sodium chloride  Antibiotics: Anti-infectives    None       PHYSICAL EXAM: Vital signs in last 24 hours: Filed Vitals:   12/11/13 2020 12/12/13 0008 12/12/13 0430 12/12/13 0831  BP: 138/65 126/81 114/73 128/56  Pulse: 74 64 91 75  Temp: 98.5 F (36.9 C)  98.3 F (36.8 C) 98.3 F (36.8 C)  TempSrc: Oral   Oral  Resp: 17 22 22 20   Height:      Weight:   46.947 kg (103 lb 8 oz)   SpO2: 96% 94% 100% 100%    Weight change: -3.856 kg (-8 lb 8 oz) Filed Weights   12/10/13 0400 12/11/13 0400 12/12/13 0430  Weight: 50.758 kg (111 lb 14.4 oz) 50.803 kg (112 lb) 46.947 kg (103 lb 8 oz)   Body mass index is 18.34 kg/(m^2).  Gen Exam: Awake and alert with clear speech.   Neck: Supple, No JVD.   Chest:Bibasilar rales-but with decreased air entry  CVS: S1 S2 Regular, 3/6 syst murmur.  Abdomen: soft, BS +,not distended today,non tender Extremities: no edema, lower extremities warm to touch. Neurologic: Non Focal.   Skin: No Rash.   Wounds: N/A.    Intake/Output from previous day:  Intake/Output Summary (Last 24 hours) at 12/12/13 0929 Last data filed at 12/12/13 0526  Gross per 24 hour  Intake      3 ml  Output    300 ml  Net   -297 ml     LAB RESULTS: CBC  Recent Labs Lab 12/08/13 2030 12/09/13 0533  WBC 9.9 5.3  HGB 13.4 11.2*  HCT 40.1 34.1*  PLT 140* 129*  MCV 95.7 95.3  MCH 32.0 31.3  MCHC 33.4 32.8  RDW 15.2 15.1  LYMPHSABS 1.2 0.9  MONOABS 0.5 0.4  EOSABS 0.0 0.0  BASOSABS 0.0 0.0    Chemistries   Recent Labs Lab 12/08/13 2030 12/09/13 0533 12/09/13 0945 12/10/13 0402 12/11/13 0345 12/12/13 0302  NA 137 140  --  141 140 141  K 4.0 3.3*  --  4.1 4.4 4.0  CL 98 101  --  101 100 100  CO2 27 28  --  31 29 29   GLUCOSE 164* 122*  --  87 81 77  BUN 14 13  --  18 18 14   CREATININE 0.58 0.51  --  0.67 0.56 0.54  CALCIUM 9.2 8.5  --  8.5 8.7 8.7  MG  --   --  1.8 2.3  --   --     CBG: No results for input(s): GLUCAP in the last 168 hours.  GFR Estimated Creatinine Clearance: 36 mL/min (by C-G formula based on Cr of 0.54).  Coagulation profile  Recent Labs Lab 12/09/13 0533  INR 1.04    Cardiac Enzymes  Recent Labs Lab 12/09/13 0533 12/09/13 0945 12/09/13 1710  TROPONINI <0.30 <0.30 <0.30    Invalid input(s): POCBNP No results for input(s): DDIMER in the last 72 hours. No results for input(s): HGBA1C in the last 72 hours. No results for input(s): CHOL, HDL, LDLCALC, TRIG, CHOLHDL, LDLDIRECT in the last 72 hours. No results for input(s): TSH, T4TOTAL, T3FREE, THYROIDAB in the last 72 hours.  Invalid input(s): FREET3 No results for input(s): VITAMINB12, FOLATE, FERRITIN, TIBC, IRON, RETICCTPCT in the last 72 hours. No results for input(s): LIPASE, AMYLASE in the last 72 hours.  Urine Studies No results for input(s): UHGB, CRYS in the last 72 hours.  Invalid input(s): UACOL, UAPR, USPG, UPH, UTP, UGL, UKET, UBIL, UNIT, UROB, ULEU, UEPI, UWBC, URBC, UBAC, CAST, UCOM,  BILUA  MICROBIOLOGY: Recent Results (from the past 240 hour(s))  MRSA PCR Screening     Status: None   Collection Time: 12/09/13  6:47 PM  Result Value Ref Range Status   MRSA by PCR NEGATIVE NEGATIVE Final    Comment:        The GeneXpert MRSA Assay (FDA approved for NASAL specimens only), is one component of a comprehensive MRSA colonization surveillance program. It is not intended to diagnose MRSA infection nor to guide or monitor treatment for MRSA infections.     RADIOLOGY STUDIES/RESULTS: Dg Chest Port 1 View  12/08/2013   CLINICAL DATA:  Shortness of breath  EXAM: PORTABLE CHEST - 1 VIEW  COMPARISON:  01/20/2013  FINDINGS: There is chronic cardiomegaly. Enlargement of the hila, likely  vascular congestion. Negative aortic contours.  Diffuse interstitial opacity with Kerley lines. Haziness of the lower chest consistent with either atelectasis and/or pleural fluid. There is a large gas-filled hiatal hernia.  No evidence of pneumothorax.  IMPRESSION: 1. Pulmonary edema, pleural effusions, and basilar atelectasis. These changes could obscure pneumonia. 2. Large hiatal hernia.   Electronically Signed   By: Tiburcio PeaJonathan  Watts M.D.   On: 12/08/2013 21:09   Dg Abd 2 Views  12/12/2013   CLINICAL DATA:  Abdominal pain  EXAM: ABDOMEN - 2 VIEW  COMPARISON:  12/11/2013  FINDINGS: Nasogastric catheter is again noted within the stomach. Scattered large and small bowel gas is noted. No free air is seen. Multiple calcified uterine fibroids are seen. Degenerative changes of lumbar spine are noted. Postsurgical changes in the proximal left hip are seen.  IMPRESSION: No acute abnormality noted. The degree of gaseous distension is improve somewhat in the interval.   Electronically Signed   By: Alcide CleverMark  Lukens M.D.   On: 12/12/2013 09:14   Dg Abd 2 Views  12/11/2013   CLINICAL DATA:  Ileus, diffuse abdominal pain and distension  EXAM: ABDOMEN - 2 VIEW  COMPARISON:  12/10/2013  FINDINGS: Nasogastric tube tip  terminates below the level of the hemidiaphragms but is not included in the field of view. Moderate enlargement of the cardiomediastinal silhouette reidentified. Left basilar atelectasis or consolidation re- noted. Multiple differential air-fluid levels are identified with increased gaseous distention of multiple loops of bowel predominantly over the lower abdomen. Calcified presumed fibroid reidentified. Dynamic left femoral nail again noted.  IMPRESSION: Increased gaseous bowel distention and a pattern typical for ileus.   Electronically Signed   By: Christiana PellantGretchen  Green M.D.   On: 12/11/2013 08:17   Dg Abd Acute W/chest  12/10/2013   CLINICAL DATA:  Abdominal distention.  EXAM: ACUTE ABDOMEN SERIES (ABDOMEN 2 VIEW & CHEST 1 VIEW)  COMPARISON:  08/07/2013.  FINDINGS: Diffuse increased bowel gas with no significant bowel distention. There are air-fluid levels on the decubitus view. No free air.  Calcification in the pelvis is consistent with fibroids. This is stable.  There has been a cholecystectomy and a ORIF of a left proximal femur fracture.  Frontal chest radiograph shows perihilar and hazy lung base opacity, improved from the study dated 12/08/2013. This is consistent with improved pulmonary edema. Cardiac silhouette is mildly enlarged. Small effusions are evident.  IMPRESSION: 1. Increased bowel gas diffusely, with multiple air-fluid levels. This is most suggestive of an adynamic ileus. There is significant bowel distention. No convincing obstruction. No free air. 2. Improved pulmonary edema since the prior chest radiograph.   Electronically Signed   By: Amie Portlandavid  Ormond M.D.   On: 12/10/2013 18:42   Dg Abd Portable 1v  12/10/2013   CLINICAL DATA:  NG tube placement  EXAM: PORTABLE ABDOMEN - 1 VIEW  COMPARISON:  12/10/2013  FINDINGS: Enteric tube tip projects over the left upper quadrant consistent with location in the upper stomach. The proximal side hole data is over the area of the EG junction. Probable  left pleural effusion. Cardiac enlargement. Gas-filled bowel loops are incompletely visualized. Suggestion is ileus.  IMPRESSION: Enteric tube tip projects over the upper stomach with proximal side hole over the EG junction.   Electronically Signed   By: Burman NievesWilliam  Stevens M.D.   On: 12/10/2013 22:27    Jeoffrey MassedGHIMIRE,Trusten Hume, MD  [facility] Pager:[phone]  If 7PM-7AM, please contact night-coverage [email or web address] Password [email or web address] 12/12/2013, 9:29 AM   LOS: 4 days

## 2013-12-12 NOTE — Plan of Care (Signed)
Problem: Phase III Progression Outcomes Goal: Activity at appropriate level-compared to baseline (UP IN CHAIR FOR HEMODIALYSIS)  Outcome: Progressing     

## 2013-12-13 ENCOUNTER — Inpatient Hospital Stay (HOSPITAL_COMMUNITY): Payer: Medicare Other

## 2013-12-13 LAB — BASIC METABOLIC PANEL
ANION GAP: 13 (ref 5–15)
BUN: 14 mg/dL (ref 6–23)
CHLORIDE: 96 meq/L (ref 96–112)
CO2: 28 mEq/L (ref 19–32)
Calcium: 8.7 mg/dL (ref 8.4–10.5)
Creatinine, Ser: 0.52 mg/dL (ref 0.50–1.10)
GFR calc Af Amer: >90 mL/min (ref >90)
GFR, EST NON AFRICAN AMERICAN: 83 mL/min — AB (ref >90)
Glucose, Bld: 93 mg/dL (ref 70–99)
POTASSIUM: 5.5 meq/L — AB (ref 3.7–5.3)
Sodium: 137 mEq/L (ref 137–147)

## 2013-12-13 MED ORDER — POLYETHYLENE GLYCOL 3350 17 G PO PACK
17.0000 g | PACK | Freq: Once | ORAL | Status: AC
  Administered 2013-12-13: 17 g via ORAL

## 2013-12-13 MED ORDER — LEVOTHYROXINE SODIUM 100 MCG PO TABS
100.0000 ug | ORAL_TABLET | Freq: Every day | ORAL | Status: DC
  Administered 2013-12-13 – 2013-12-14 (×2): 100 ug via ORAL
  Filled 2013-12-13: qty 1

## 2013-12-13 MED ORDER — PANCRELIPASE (LIP-PROT-AMYL) 12000-38000 UNITS PO CPEP
24000.0000 [IU] | ORAL_CAPSULE | Freq: Three times a day (TID) | ORAL | Status: DC
  Administered 2013-12-13 – 2013-12-14 (×4): 24000 [IU] via ORAL
  Filled 2013-12-13 (×4): qty 2

## 2013-12-13 MED ORDER — LORAZEPAM 0.5 MG PO TABS
0.5000 mg | ORAL_TABLET | Freq: Two times a day (BID) | ORAL | Status: DC
  Administered 2013-12-13 – 2013-12-14 (×3): 0.5 mg via ORAL
  Filled 2013-12-13 (×3): qty 1

## 2013-12-13 MED ORDER — FUROSEMIDE 40 MG PO TABS: 40.0000 mg | ORAL_TABLET | Freq: Once | ORAL | Status: DC

## 2013-12-13 MED ORDER — METOCLOPRAMIDE HCL 5 MG/ML IJ SOLN
5.0000 mg | Freq: Three times a day (TID) | INTRAMUSCULAR | Status: DC
  Administered 2013-12-13 – 2013-12-14 (×5): 5 mg via INTRAVENOUS
  Filled 2013-12-13 (×5): qty 2

## 2013-12-13 MED ORDER — FUROSEMIDE 40 MG PO TABS
40.0000 mg | ORAL_TABLET | Freq: Every day | ORAL | Status: DC
  Administered 2013-12-13 – 2013-12-14 (×2): 40 mg via ORAL
  Filled 2013-12-13 (×2): qty 1

## 2013-12-13 MED ORDER — POTASSIUM CHLORIDE CRYS ER 10 MEQ PO TBCR
10.0000 meq | EXTENDED_RELEASE_TABLET | Freq: Every day | ORAL | Status: DC
  Filled 2013-12-13: qty 1

## 2013-12-13 MED ORDER — LEVOTHYROXINE SODIUM 100 MCG PO TABS
100.0000 ug | ORAL_TABLET | Freq: Every day | ORAL | Status: DC
  Filled 2013-12-13: qty 1

## 2013-12-13 NOTE — Progress Notes (Signed)
PT Cancellation Note  Patient Details Name: Elizabeth CarpenterKathleen M Fenner MRN: [id number] DOB: January 12, 1924   Cancelled Treatment:    Reason Eval/Treat Not Completed: Patient declined, no reason specified  Pt states she is going to eat prior to working with physical therapy. Will follow up as time allows.   [place] Mount[place]bour, Keniah Klemmer S 12/13/2013, 12:55 PM Sunday SpillersLogan Secor La[place]bour, [place] Carolina[place] [phone]

## 2013-12-13 NOTE — Plan of Care (Signed)
Problem: Phase I Progression Outcomes Goal: Other Phase I Outcomes/Goals Outcome: Completed/Met Date Met:  12/13/13     

## 2013-12-13 NOTE — Progress Notes (Signed)
PT Cancellation Note  Patient Details Name: Rosaura CarpenterKathleen M Preusser MRN: [id number] DOB: 09-17-1923   Cancelled Treatment:    Reason Eval/Treat Not Completed: Patient declined, no reason specified  Second attempt to work with patient this afternoon. Pt declines again stating she is too nervous and "maybe tomorrow."  Explained importance of being OOB to prevent secondary complications. Will attempt PT session at a later time.   [address]Logan Secor Butte[place]bour, [place] Carolina[place] [phone]   Kathlyn Sacramento[place]bour, Onesimo Lingard S 12/13/2013, 3:57 PM

## 2013-12-13 NOTE — Progress Notes (Addendum)
PATIENT DETAILS Name: Elizabeth [name] Age: 80 y.o. Sex: female Date of Birth: Dec 04, 1923 Admit Date: 12/08/2013 Admitting Physician Berle Mull, MD GNF:AOZHYQ,MVHQI, MD  Brief summary 80 year old Caucasian female with a history of chronic systolic heart failure EF around 20%, brought to the hospital for  Shortness of breath. Found to have CHF decompensation. Significantly better with IV Lasix, however Hospital course by development of ileus. Unfortunately, patient is been demanding diet, after a goals of care discussion with family, plan is comfort measures. Please see below.  Subjective: Desperately wants to eat. Had BM yesterday following an enema  Assessment/Plan: Principal Problem:   Acute on chronic systolic congestive heart failure:last echo in December 2014 showed EF around 20-25%.admitted, and started on IV Lasix. Significantly compensated. Repeat repeat echocardiogram on 11/6 confirms EF 20-25%.Since significantly compensated, will change back to oral Lasix. Cardiology consulted during this hospital stay.Resume ACE inhibitor on discharge-if hyperkalemia better, we'll attempt to add beta blocker if blood pressure permits, however now mostly comfort care and family and patient prefers this symptom management.  Active Problems: Ileus:developed abdominal distention on 11/7-abdominal x-ray confirmed ileus. NG tube placed, keep nothing by mouth.unfortunately, repeatedly asking for diet, attempted to advance diet yesterday, however she developed ileus and  was kept NPO. This morning, again actually begging for food. This M.D. met with patient, healthcare power of attorney/nephew [name] and niece at bedside today. After long discussion, we have decided to mostly pursue comfort measures and allow comfort diet. Family and patient are accepting all risks, including worsening ileus, peritonitis/bowel perforation and other life threatening and life disabling effects. MOST form filled, and  placed in shadow chart. Plan is to watch patient for another 24 hours, if relatively stable then discharge to  SNF with hospice follow-up, if deteriorates, then discharge to [facility]. Family and patient, do not want NG tube reinserted, no further labs, if deteriorates while inpatient, will start scheduled morphine and Ativan. 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     ?Syncope: not clear exactly whether patient passed out prior to this hospitalization. Given significantly low ejection fraction, VT remains a concern.Seen by cardiology, not a candidate for AICD.    Hypothyroidism: Continue with levothyroxine    DEPRESSION/ANXIETY:resume lorazepam    Mild Delirium:suspect mild dementia at baseline. Stable-continues to have intermittent visual hallucinations.    Essential hypertension, benign:controlled.Given mild hyperkalemia, we will continue to hold lisinopril.    Hypokalemia:Repleted. Likely secondary to Lasix.    Frailty/deconditioning: PT eval.Suspect may need SNF with Hospice follow up-of if deteriorates-[facility]  Disposition: Remain inpatient-SNF with hospice vs [facility]  Antibiotics:  None  DVT Prophylaxis: Prophylactic Heparin  Code Status:  DNR  Family Communication Family discussion with nephew/niece at bedside.  Procedures:  None  CONSULTS:  cardiology   MEDICATIONS: Scheduled Meds: . antiseptic oral rinse  7 mL Mouth Rinse BID  . furosemide  40 mg Oral Daily  . heparin  5,000 Units Subcutaneous 3 times per day  . levothyroxine  100 mcg Oral QAC breakfast  . lipase/protease/amylase  24,000 Units Oral TID AC  . LORazepam  0.25 mg Intravenous Q12H  . metoCLOPramide (REGLAN) injection  5 mg Intravenous TID AC & HS  . polyethylene glycol  17 g Oral Daily  . potassium chloride  10 mEq Oral  Daily  .  sodium chloride  3 mL Intravenous Q12H   Continuous Infusions:  PRN Meds:.sodium chloride, acetaminophen, albuterol, morphine injection, ondansetron (ZOFRAN) IV, phenol, sodium chloride  Antibiotics: Anti-infectives    None       PHYSICAL EXAM: Vital signs in last 24 hours: Filed Vitals:   12/13/13 0500 12/13/13 0630 12/13/13 0751 12/13/13 1206  BP:  116/61 117/86 127/75  Pulse:  67 75 80  Temp:  97.8 F (36.6 C) 98.1 F (36.7 C) 98 F (36.7 C)  TempSrc:   Oral Oral  Resp:  _0 Height: 5' 3" (1.6 m)     Weight: 44.09 kg (97 lb 3.2 oz)     SpO2:  98% 100% 100%    Weight change: -2.858 kg (-6 lb 4.8 oz) Filed Weights   12/11/13 0400 12/12/13 0430 12/13/13 0500  Weight: 50.803 kg (112 lb) 46.947 kg (103 lb 8 oz) 44.09 kg (97 lb 3.2 oz)   Body mass index is 17.22 kg/(m^2).   Gen Exam: Awake and alert with clear speech.   Neck: Supple, No JVD.   Chest:Bibasilar rales-but with decreased air entry  CVS: S1 S2 Regular, 3/6 syst murmur.  Abdomen: soft, BS +,mildly distended today,non tender Extremities: no edema, lower extremities warm to touch. Neurologic: Non Focal.   Skin: No Rash.   Wounds: N/A.   Intake/Output from previous day:  Intake/Output Summary (Last 24 hours) at 12/13/13 1332 Last data filed at 12/13/13 0625  Gross per 24 hour  Intake      0 ml  Output    450 ml  Net   -450 ml     LAB RESULTS: CBC  Recent Labs Lab 12/08/13 2030 12/09/13 0533  WBC 9.9 5.3  HGB 13.4 11.2*  HCT 40.1 34.1*  PLT 140* 129*  MCV 95.7 95.3  MCH 32.0 31.3  MCHC 33.4 32.8  RDW 15.2 15.1  LYMPHSABS 1.2 0.9  MONOABS 0.5 0.4  EOSABS 0.0 0.0  BASOSABS 0.0 0.0    Chemistries   Recent Labs Lab 12/09/13 0533 12/09/13 0945 12/10/13 0402 12/11/13 0345 12/12/13 0302 12/13/13 0340  NA 140  --  141 140 141 137  K 3.3*  --  4.1 4.4 4.0 5.5*  CL 101  --  101 100 100 96  CO2 28  --  _1 GLUCOSE 122*  --  87 81 77 93  BUN 13  --  _2 CREATININE 0.51  --  0.67 0.56 0.54 0.52  CALCIUM 8.5  --  8.5 8.7 8.7 8.7  MG  --  1.8 2.3  --   --   --     CBG: No results for input(s): GLUCAP in the last 168 hours.  GFR Estimated Creatinine Clearance: 33.8 mL/min (by C-G formula based on Cr of 0.52).  Coagulation profile  Recent Labs Lab 12/09/13 0533  INR 1.04    Cardiac Enzymes  Recent Labs Lab 12/09/13 0533 12/09/13 0945 12/09/13 1710  TROPONINI <0.30 <0.30 <0.30    Invalid input(s): POCBNP No results for input(s): DDIMER in the last 72 hours. No results for input(s): HGBA1C in the last 72 hours. No results for input(s): CHOL, HDL, LDLCALC, TRIG, CHOLHDL, LDLDIRECT in the last 72 hours. No results for input(s): TSH, T4TOTAL, T3FREE, THYROIDAB in the last 72 hours.  Invalid input(s): FREET3 No results for input(s): VITAMINB12, FOLATE, FERRITIN, TIBC, IRON, RETICCTPCT in the last 72 hours. No results for input(s): LIPASE, AMYLASE in  the last 72 hours.  Urine Studies No results for input(s): UHGB, CRYS in the last 72 hours.  Invalid input(s): UACOL, UAPR, USPG, UPH, UTP, UGL, UKET, UBIL, UNIT, UROB, ULEU, UEPI, UWBC, URBC, UBAC, CAST, UCOM, BILUA  MICROBIOLOGY: Recent Results (from the past 240 hour(s))  MRSA PCR Screening     Status: None   Collection Time: 12/09/13  6:47 PM  Result Value Ref Range Status   MRSA by PCR NEGATIVE NEGATIVE Final    Comment:        The GeneXpert MRSA Assay (FDA approved for NASAL specimens only), is one component of a comprehensive MRSA colonization surveillance program. It is not intended to diagnose MRSA infection nor to guide or monitor treatment for MRSA infections.     RADIOLOGY STUDIES/RESULTS: Dg Chest Port 1 View  12/08/2013   CLINICAL DATA:  Shortness of breath  EXAM: PORTABLE CHEST - 1 VIEW  COMPARISON:  01/20/2013  FINDINGS: There is chronic cardiomegaly. Enlargement of the hila, likely vascular congestion. Negative aortic contours.  Diffuse  interstitial opacity with Kerley lines. Haziness of the lower chest consistent with either atelectasis and/or pleural fluid. There is a large gas-filled hiatal hernia.  No evidence of pneumothorax.  IMPRESSION: 1. Pulmonary edema, pleural effusions, and basilar atelectasis. These changes could obscure pneumonia. 2. Large hiatal hernia.   Electronically Signed   By: Jorje Guild M.D.   On: 12/08/2013 21:09   Dg Abd 2 Views  12/13/2013   CLINICAL DATA:  Ileus.  Abdominal pain and nausea.  Weakness.  EXAM: ABDOMEN - 2 VIEW  COMPARISON:  12/12/2013  FINDINGS: Enteric tube remains in place with tip in the expected region of the distal stomach. No intraperitoneal free air is identified. Gas is present in loops of small and large bowel. Gaseous small bowel distention has improved from the prior study. Coarse calcifications in the pelvis are unchanged and likely represent fibroids. Right upper quadrant surgical clips are noted. Proximal left femoral fixation hardware is partially visualized.  IMPRESSION: Decreased gaseous small bowel distension, which may reflect improving ileus.   Electronically Signed   By: Logan Bores   On: 12/13/2013 08:41   Dg Abd 2 Views  12/12/2013   CLINICAL DATA:  Abdominal distention.  EXAM: ABDOMEN - 2 VIEW  COMPARISON:  Earlier same day, 12/11/2013 and 12/10/2013.  FINDINGS: Nasogastric tube has tip overlying the expected region of the stomach in the left upper quadrant with side port in the expected region of the gastroesophageal junction. The exam again demonstrates multiple air-filled loops of large small bowel as several of the small bowel loops are dilated although not significantly changed likely representing continued ileus. No evidence of free peritoneal air. Remainder the exam is unchanged.  IMPRESSION: Continued air-filled loops of large a small with several dilated small bowel loops unchanged likely due to continued ileus. No free peritoneal air.  Nasogastric tube with tip  over the expected region of the stomach in the left upper quadrant and side-port in the expected region of the gastroesophageal junction.   Electronically Signed   By: Marin Olp M.D.   On: 12/12/2013 17:30   Dg Abd 2 Views  12/12/2013   CLINICAL DATA:  Abdominal pain  EXAM: ABDOMEN - 2 VIEW  COMPARISON:  12/11/2013  FINDINGS: Nasogastric catheter is again noted within the stomach. Scattered large and small bowel gas is noted. No free air is seen. Multiple calcified uterine fibroids are seen. Degenerative changes of lumbar spine are noted. Postsurgical changes  in the proximal left hip are seen.  IMPRESSION: No acute abnormality noted. The degree of gaseous distension is improve somewhat in the interval.   Electronically Signed   By: Inez Catalina M.D.   On: 12/12/2013 09:14   Dg Abd 2 Views  12/11/2013   CLINICAL DATA:  Ileus, diffuse abdominal pain and distension  EXAM: ABDOMEN - 2 VIEW  COMPARISON:  12/10/2013  FINDINGS: Nasogastric tube tip terminates below the level of the hemidiaphragms but is not included in the field of view. Moderate enlargement of the cardiomediastinal silhouette reidentified. Left basilar atelectasis or consolidation re- noted. Multiple differential air-fluid levels are identified with increased gaseous distention of multiple loops of bowel predominantly over the lower abdomen. Calcified presumed fibroid reidentified. Dynamic left femoral nail again noted.  IMPRESSION: Increased gaseous bowel distention and a pattern typical for ileus.   Electronically Signed   By: Conchita Paris M.D.   On: 12/11/2013 08:17   Dg Abd Acute W/chest  12/10/2013   CLINICAL DATA:  Abdominal distention.  EXAM: ACUTE ABDOMEN SERIES (ABDOMEN 2 VIEW & CHEST 1 VIEW)  COMPARISON:  08/07/2013.  FINDINGS: Diffuse increased bowel gas with no significant bowel distention. There are air-fluid levels on the decubitus view. No free air.  Calcification in the pelvis is consistent with fibroids. This is stable.   There has been a cholecystectomy and a ORIF of a left proximal femur fracture.  Frontal chest radiograph shows perihilar and hazy lung base opacity, improved from the study dated 12/08/2013. This is consistent with improved pulmonary edema. Cardiac silhouette is mildly enlarged. Small effusions are evident.  IMPRESSION: 1. Increased bowel gas diffusely, with multiple air-fluid levels. This is most suggestive of an adynamic ileus. There is significant bowel distention. No convincing obstruction. No free air. 2. Improved pulmonary edema since the prior chest radiograph.   Electronically Signed   By: Lajean Manes M.D.   On: 12/10/2013 18:42   Dg Abd Portable 1v  12/10/2013   CLINICAL DATA:  NG tube placement  EXAM: PORTABLE ABDOMEN - 1 VIEW  COMPARISON:  12/10/2013  FINDINGS: Enteric tube tip projects over the left upper quadrant consistent with location in the upper stomach. The proximal side hole data is over the area of the EG junction. Probable left pleural effusion. Cardiac enlargement. Gas-filled bowel loops are incompletely visualized. Suggestion is ileus.  IMPRESSION: Enteric tube tip projects over the upper stomach with proximal side hole over the EG junction.   Electronically Signed   By: Lucienne Capers M.D.   On: 12/10/2013 22:27    Oren Binet, MD  [facility] Pager:[phone]  If 7PM-7AM, please contact night-coverage [email or web address] Password [email or web address] 12/13/2013, 1:32 PM   LOS: 5 days

## 2013-12-14 ENCOUNTER — Non-Acute Institutional Stay (SKILLED_NURSING_FACILITY): Payer: Medicare Other | Admitting: Internal Medicine

## 2013-12-14 DIAGNOSIS — R410 Disorientation, unspecified: Secondary | ICD-10-CM

## 2013-12-14 DIAGNOSIS — R55 Syncope and collapse: Secondary | ICD-10-CM

## 2013-12-14 DIAGNOSIS — J9621 Acute and chronic respiratory failure with hypoxia: Secondary | ICD-10-CM

## 2013-12-14 DIAGNOSIS — K868 Other specified diseases of pancreas: Secondary | ICD-10-CM

## 2013-12-14 DIAGNOSIS — F341 Dysthymic disorder: Secondary | ICD-10-CM

## 2013-12-14 DIAGNOSIS — E034 Atrophy of thyroid (acquired): Secondary | ICD-10-CM

## 2013-12-14 DIAGNOSIS — I5023 Acute on chronic systolic (congestive) heart failure: Secondary | ICD-10-CM

## 2013-12-14 DIAGNOSIS — E038 Other specified hypothyroidism: Secondary | ICD-10-CM

## 2013-12-14 DIAGNOSIS — K8689 Other specified diseases of pancreas: Secondary | ICD-10-CM

## 2013-12-14 DIAGNOSIS — K567 Ileus, unspecified: Secondary | ICD-10-CM

## 2013-12-14 DIAGNOSIS — I10 Essential (primary) hypertension: Secondary | ICD-10-CM

## 2013-12-14 MED ORDER — SENNA 8.6 MG PO TABS
1.0000 | ORAL_TABLET | Freq: Every day | ORAL | Status: AC
Start: 2013-12-14 — End: ?

## 2013-12-14 MED ORDER — LORAZEPAM 0.5 MG PO TABS: ORAL_TABLET | ORAL | Status: DC

## 2013-12-14 MED ORDER — TRAMADOL HCL 50 MG PO TABS: 50.0000 mg | ORAL_TABLET | Freq: Four times a day (QID) | ORAL | Status: DC | PRN

## 2013-12-14 NOTE — Progress Notes (Signed)
Called Report to receiving RN. Notified her that patient was on her way. Cecille Rubinhompson,Sherlene Rickel V, RN

## 2013-12-14 NOTE — Progress Notes (Signed)
PT Cancellation Note  Patient Details Name: Rosaura CarpenterKathleen M Northcraft MRN: [id number] DOB: 13-Jul-1923   Cancelled Treatment:     Changed pts frequency to 2xweek as this is appropriate frequency for a patient going to [place].  Will continue PT tomorrow.  Thanks.     Tawni MillersWhite, Brittian Renaldo F 12/14/2013, 12:19 PM Enter[employer] CorporationDawn Ghadeer Kastelic,PT Acute Rehabilitation [phone] (pager)

## 2013-12-14 NOTE — Discharge Summary (Addendum)
Physician Discharge Summary  Elizabeth [name] IRS:[id number] DOB: 01-Jun-1923 DOA: 12/08/2013  PCP: Tamsen Roers, MD  Admit date: 12/08/2013 Discharge date: 12/14/2013  Time spent: 40 minutes  Recommendations for Outpatient Follow-up:  1. Follow-up with nursing home M.D. 2. Palliative care to follow-up with patient in the nursing home.  Discharge Diagnoses:  Principal Problem:   Acute on chronic systolic congestive heart failure Active Problems:   Hypothyroidism   DEPRESSION/ANXIETY   Essential hypertension, benign   Hypokalemia   GERD (gastroesophageal reflux disease)   Discharge Condition: stable  Diet recommendation: heart healthy  Filed Weights   12/12/13 0430 12/13/13 0500 12/14/13 0400  Weight: 46.947 kg (103 lb 8 oz) 44.09 kg (97 lb 3.2 oz) 44.589 kg (98 lb 4.8 oz)    History of present illness:  Elizabeth [name] is a 80 y.o. female with Past medical history of hypothyroidism, hypertension, gait abnormality, chronic systolic heart failure, osteoporosis, compression fracture of the lumbar vertebra.. The patient was found on the floor by EMS. Patient was found on the floor by her family member and EMS was called. Patient was initially found to have hypoxia with saturations remaining 86 on room air with wheezing. After placing the patient on C Pap she was brought here and due to her persistent increased work of breathing when she was continued on BiPAP. Patient mentions she has gained some fluid over last few days denies any chest pain complaints of shortness of breath and cough denies any nausea or vomiting or acid reflux. She mentions she is taking all her medications no dizziness no lightheadedness. She denies any focal deficit diarrhea or burning urination. She lives alone close to her son.  Hospital Course:   Acute on chronic systolic congestive heart failure: Last echo in December 2014 showed EF around 20-25%.admitted, and started on IV Lasix.  Significantly  compensated. Repeat repeat echocardiogram on 11/6 confirms EF 20-25%. Since significantly compensated, will change back to oral Lasix.  Cardiology consulted during this hospital stay. ACE inhibitors discontinued on discharge because of the hyperkalemia. Consider restarting lisinopril when potassium is okay. Mostly comfort care and family and patient prefers this symptom management.  Ileus: Developed abdominal distention on 11/7-abdominal x-ray confirmed ileus.  NG tube placed, keep nothing by mouth.unfortunately, repeatedly asking for diet, attempted to advance diet yesterday, however she developed ileus and was kept NPO.  Dr. Sloan Leiter met with patient, healthcare power of attorney/nephew Elizabeth [name] and niece at bedside today.  After long discussion, we have decided to mostly pursue comfort measures and allow comfort diet.  Family and patient are accepting all risks, including worsening ileus, peritonitis/bowel perforation and other life threatening and life disabling effects.  MOST form filled, and placed in shadow chart.  Per RN  patient had a small bowel movement earlier, patient will be discharged to nursing home with palliative care to follow.  Acute on Chronic Hypoxic Resp Failure:secondary to above, required BiPAP initially, now doing well on O2 via [place].Titrate off O2.   ?Syncope: not clear exactly whether patient passed out prior to this hospitalization. Given significantly low ejection fraction, VT remains a concern.Seen by cardiology, not a candidate for AICD.   Hypothyroidism: Continue with levothyroxine   DEPRESSION/ANXIETY:resume lorazepam   Mild Delirium:suspect mild dementia at baseline. Stable-continues to have intermittent visual hallucinations.   Essential hypertension, benign:controlled.Given mild hyperkalemia, we will continue to hold lisinopril.   Hypokalemia: Repleted. Likely secondary to Lasix.   Frailty/deconditioning: PT eval. Recommended  SNF.  Procedures:  none  Consultations:  cardiology  Discharge Exam: Filed Vitals:   12/14/13 1039  BP: 104/50  Pulse: 86  Temp: 98.7 F (37.1 C)  Resp: 20   General: Alert and awake, oriented x3, not in any acute distress. HEENT: anicteric sclera, pupils reactive to light and accommodation, EOMI CVS: S1-S2 clear, no murmur rubs or gallops Chest: clear to auscultation bilaterally, no wheezing, rales or rhonchi Abdomen: distended yet soft abdomen and nontender. Extremities: no cyanosis, clubbing or edema noted bilaterally Neuro: Cranial nerves II-XII intact, no focal neurological deficits  Discharge Instructions You were cared for by a hospitalist during your hospital stay. If you have any questions about your discharge medications or the care you received while you were in the hospital after you are discharged, you can call the unit and asked to speak with the hospitalist on call if the hospitalist that took care of you is not available. Once you are discharged, your primary care physician will handle any further medical issues. Please note that NO REFILLS for any discharge medications will be authorized once you are discharged, as it is imperative that you return to your primary care physician (or establish a relationship with a primary care physician if you do not have one) for your aftercare needs so that they can reassess your need for medications and monitor your lab values.  Discharge Instructions    Diet - low sodium heart healthy    Complete by:  As directed      Increase activity slowly    Complete by:  As directed           Current Discharge Medication List    START taking these medications   Details  senna (SENOKOT) 8.6 MG TABS tablet Take 1 tablet (8.6 mg total) by mouth daily.      CONTINUE these medications which have CHANGED   Details  LORazepam (ATIVAN) 0.5 MG tablet TAKE ONE-HALF TABLET BY MOUTH TWICE DAILY Qty: 10 tablet, Refills: 0    traMADol  (ULTRAM) 50 MG tablet Take 1 tablet (50 mg total) by mouth every 6 (six) hours as needed for moderate pain. Qty: 10 tablet, Refills: 0      CONTINUE these medications which have NOT CHANGED   Details  calcium-vitamin D (OSCAL WITH D) 500-200 MG-UNIT per tablet Take 2 tablets by mouth 2 (two) times daily.    Cyanocobalamin (VITAMIN B-12 PO) Take 1 tablet by mouth daily.    furosemide (LASIX) 20 MG tablet Take 20 mg by mouth 2 (two) times daily.     ibuprofen (ADVIL,MOTRIN) 200 MG tablet Take 800 mg by mouth every 8 (eight) hours as needed (pain).    levothyroxine (SYNTHROID, LEVOTHROID) 100 MCG tablet Take 100 mcg by mouth daily before breakfast.    lipase/protease/amylase (CREON-12/PANCREASE) 12000 UNITS CPEP capsule Take 2 capsules by mouth 3 (three) times daily before meals. And take 12000 units with snacks Qty: 270 capsule, Refills: 11   Associated Diagnoses: Pancreatic insufficiency    metoprolol tartrate (LOPRESSOR) 12.5 mg TABS tablet Take 0.5 tablets (12.5 mg total) by mouth 2 (two) times daily.    potassium chloride (K-DUR,KLOR-CON) 10 MEQ tablet Take 10 mEq by mouth daily.    promethazine (PHENERGAN) 25 MG tablet Take 25 mg by mouth every 6 (six) hours as needed for nausea or vomiting.    Vitamin D, Ergocalciferol, (DRISDOL) 50000 UNITS CAPS capsule Take 50,000 Units by mouth every 7 (seven) days.    bisacodyl (DULCOLAX) 10 MG suppository Place 1 suppository (10 mg total)  rectally daily. Qty: 12 suppository, Refills: 0    polyethylene glycol (MIRALAX / GLYCOLAX) packet Take 17 g by mouth 2 (two) times daily. Qty: 14 each, Refills: 0      STOP taking these medications     lisinopril (PRINIVIL,ZESTRIL) 2.5 MG tablet      docusate sodium 100 MG CAPS      fentaNYL (DURAGESIC - DOSED MCG/HR) 12 MCG/HR        No Known Allergies Follow-up Information    Follow up with LITTLE,JAMES, MD In 1 week.   Specialty:  Family Medicine   Contact information:   [address]  [address] [place] [address] [phone]        The results of significant diagnostics from this hospitalization (including imaging, microbiology, ancillary and laboratory) are listed below for reference.    Significant Diagnostic Studies: Dg Chest Port 1 View  12/08/2013   CLINICAL DATA:  Shortness of breath  EXAM: PORTABLE CHEST - 1 VIEW  COMPARISON:  01/20/2013  FINDINGS: There is chronic cardiomegaly. Enlargement of the hila, likely vascular congestion. Negative aortic contours.  Diffuse interstitial opacity with Kerley lines. Haziness of the lower chest consistent with either atelectasis and/or pleural fluid. There is a large gas-filled hiatal hernia.  No evidence of pneumothorax.  IMPRESSION: 1. Pulmonary edema, pleural effusions, and basilar atelectasis. These changes could obscure pneumonia. 2. Large hiatal hernia.   Electronically Signed   By: Jorje Guild M.D.   On: 12/08/2013 21:09   Dg Abd 2 Views  12/13/2013   CLINICAL DATA:  Ileus.  Abdominal pain and nausea.  Weakness.  EXAM: ABDOMEN - 2 VIEW  COMPARISON:  12/12/2013  FINDINGS: Enteric tube remains in place with tip in the expected region of the distal stomach. No intraperitoneal free air is identified. Gas is present in loops of small and large bowel. Gaseous small bowel distention has improved from the prior study. Coarse calcifications in the pelvis are unchanged and likely represent fibroids. Right upper quadrant surgical clips are noted. Proximal left femoral fixation hardware is partially visualized.  IMPRESSION: Decreased gaseous small bowel distension, which may reflect improving ileus.   Electronically Signed   By: Logan Bores   On: 12/13/2013 08:41   Dg Abd 2 Views  12/12/2013   CLINICAL DATA:  Abdominal distention.  EXAM: ABDOMEN - 2 VIEW  COMPARISON:  Earlier same day, 12/11/2013 and 12/10/2013.  FINDINGS: Nasogastric tube has tip overlying the expected region of the stomach in the left upper quadrant with side port in the  expected region of the gastroesophageal junction. The exam again demonstrates multiple air-filled loops of large small bowel as several of the small bowel loops are dilated although not significantly changed likely representing continued ileus. No evidence of free peritoneal air. Remainder the exam is unchanged.  IMPRESSION: Continued air-filled loops of large a small with several dilated small bowel loops unchanged likely due to continued ileus. No free peritoneal air.  Nasogastric tube with tip over the expected region of the stomach in the left upper quadrant and side-port in the expected region of the gastroesophageal junction.   Electronically Signed   By: Marin Olp M.D.   On: 12/12/2013 17:30   Dg Abd 2 Views  12/12/2013   CLINICAL DATA:  Abdominal pain  EXAM: ABDOMEN - 2 VIEW  COMPARISON:  12/11/2013  FINDINGS: Nasogastric catheter is again noted within the stomach. Scattered large and small bowel gas is noted. No free air is seen. Multiple calcified uterine  fibroids are seen. Degenerative changes of lumbar spine are noted. Postsurgical changes in the proximal left hip are seen.  IMPRESSION: No acute abnormality noted. The degree of gaseous distension is improve somewhat in the interval.   Electronically Signed   By: Inez Catalina M.D.   On: 12/12/2013 09:14   Dg Abd 2 Views  12/11/2013   CLINICAL DATA:  Ileus, diffuse abdominal pain and distension  EXAM: ABDOMEN - 2 VIEW  COMPARISON:  12/10/2013  FINDINGS: Nasogastric tube tip terminates below the level of the hemidiaphragms but is not included in the field of view. Moderate enlargement of the cardiomediastinal silhouette reidentified. Left basilar atelectasis or consolidation re- noted. Multiple differential air-fluid levels are identified with increased gaseous distention of multiple loops of bowel predominantly over the lower abdomen. Calcified presumed fibroid reidentified. Dynamic left femoral nail again noted.  IMPRESSION: Increased gaseous  bowel distention and a pattern typical for ileus.   Electronically Signed   By: Conchita Paris M.D.   On: 12/11/2013 08:17   Dg Abd Acute W/chest  12/10/2013   CLINICAL DATA:  Abdominal distention.  EXAM: ACUTE ABDOMEN SERIES (ABDOMEN 2 VIEW & CHEST 1 VIEW)  COMPARISON:  08/07/2013.  FINDINGS: Diffuse increased bowel gas with no significant bowel distention. There are air-fluid levels on the decubitus view. No free air.  Calcification in the pelvis is consistent with fibroids. This is stable.  There has been a cholecystectomy and a ORIF of a left proximal femur fracture.  Frontal chest radiograph shows perihilar and hazy lung base opacity, improved from the study dated 12/08/2013. This is consistent with improved pulmonary edema. Cardiac silhouette is mildly enlarged. Small effusions are evident.  IMPRESSION: 1. Increased bowel gas diffusely, with multiple air-fluid levels. This is most suggestive of an adynamic ileus. There is significant bowel distention. No convincing obstruction. No free air. 2. Improved pulmonary edema since the prior chest radiograph.   Electronically Signed   By: Lajean Manes M.D.   On: 12/10/2013 18:42   Dg Abd Portable 1v  12/10/2013   CLINICAL DATA:  NG tube placement  EXAM: PORTABLE ABDOMEN - 1 VIEW  COMPARISON:  12/10/2013  FINDINGS: Enteric tube tip projects over the left upper quadrant consistent with location in the upper stomach. The proximal side hole data is over the area of the EG junction. Probable left pleural effusion. Cardiac enlargement. Gas-filled bowel loops are incompletely visualized. Suggestion is ileus.  IMPRESSION: Enteric tube tip projects over the upper stomach with proximal side hole over the EG junction.   Electronically Signed   By: Lucienne Capers M.D.   On: 12/10/2013 22:27    Microbiology: Recent Results (from the past 240 hour(s))  MRSA PCR Screening     Status: None   Collection Time: 12/09/13  6:47 PM  Result Value Ref Range Status   MRSA by  PCR NEGATIVE NEGATIVE Final    Comment:        The GeneXpert MRSA Assay (FDA approved for NASAL specimens only), is one component of a comprehensive MRSA colonization surveillance program. It is not intended to diagnose MRSA infection nor to guide or monitor treatment for MRSA infections.      Labs: Basic Metabolic Panel:  Recent Labs Lab 12/09/13 0533 12/09/13 0945 12/10/13 0402 12/11/13 0345 12/12/13 0302 12/13/13 0340  NA 140  --  141 140 141 137  K 3.3*  --  4.1 4.4 4.0 5.5*  CL 101  --  101 100 100 96  CO2 28  --  $'31 29 29 28  'v$ GLUCOSE 122*  --  87 81 77 93  BUN 13  --  $R'18 18 14 14  'SX$ CREATININE 0.51  --  0.67 0.56 0.54 0.52  CALCIUM 8.5  --  8.5 8.7 8.7 8.7  MG  --  1.8 2.3  --   --   --    Liver Function Tests:  Recent Labs Lab 12/09/13 0533  AST 17  ALT 10  ALKPHOS 86  BILITOT 0.5  PROT 5.3*  ALBUMIN 3.0*   No results for input(s): LIPASE, AMYLASE in the last 168 hours. No results for input(s): AMMONIA in the last 168 hours. CBC:  Recent Labs Lab 12/08/13 2030 12/09/13 0533  WBC 9.9 5.3  NEUTROABS 8.2* 3.9  HGB 13.4 11.2*  HCT 40.1 34.1*  MCV 95.7 95.3  PLT 140* 129*   Cardiac Enzymes:  Recent Labs Lab 12/08/13 2030 12/09/13 0533 12/09/13 0945 12/09/13 1710  TROPONINI <0.30 <0.30 <0.30 <0.30   BNP: BNP (last 3 results)  Recent Labs  01/20/13 2021 01/21/13 0902 12/08/13 2030  PROBNP 31867.0* 34451.0* 25365.0*   CBG: No results for input(s): GLUCAP in the last 168 hours.     Signed:  Kaleel Schmieder A  [facility] 12/14/2013, 1:08 PM

## 2013-12-14 NOTE — Progress Notes (Signed)
CSW Proofreader(Clinical Social Worker) spoke with pt nephew and he would like to accept bed offer at [facility]. CSW notified facility and faxed clinicals to insurance for authorization.  Anely Spiewak, LCSWA [phone]

## 2013-12-14 NOTE — Plan of Care (Signed)
Problem: Discharge Progression Outcomes Goal: Activity appropriate for discharge plan Outcome: Completed/Met Date Met:  12/14/13

## 2013-12-14 NOTE — Progress Notes (Signed)
MRN: [id number] Name: Elizabeth [name]  Sex: female Age: 80 y.o. DOB: [birth year]  [place] #: [facility] Facility/Room:  Level Of Care: SNF Provider: Inocencio Homes D Emergency Contacts: Extended Emergency Contact Information Primary Emergency Contact: [name],[name] Address: [address] [facility] [address] [facility]          [place] [address] [place] Phone: [phone] Mobile Phone: [phone] Relation: Nephew   Allergies: Review of patient's allergies indicates no known allergies.  Chief Complaint  Patient presents with  . New Admit To SNF    HPI: Patient is 80 y.o. female who is admitted to SNF for generalized weakness s/p hospitalization for acute on chronic CHF.  Past Medical History  Diagnosis Date  . Thyroid disease   . Abnormality of gait 04/21/2008    Qualifier: Diagnosis of  By: Walker Kehr MD, Patrick Jupiter    . ALCOHOL ABUSE 04/21/2008    Qualifier: Diagnosis of  By: Walker Kehr MD, Patrick Jupiter    . ANEMIA, SECONDARY TO ACUTE BLOOD LOSS 04/21/2008    Qualifier: Diagnosis of  By: Walker Kehr MD, Patrick Jupiter    . DEPRESSION/ANXIETY 04/21/2008    Qualifier: Diagnosis of  By: Walker Kehr MD, Patrick Jupiter    . UTI 04/21/2008    Qualifier: Diagnosis of  By: Walker Kehr MD, Patrick Jupiter    . CLOSED FRACTURE INTERTROCHANTERIC SECTION FEMUR 04/21/2008    Qualifier: Diagnosis of  By: Walker Kehr MD, Patrick Jupiter    . OSTEOARTHRITIS, KNEE, LEFT, MILD 04/21/2008    Qualifier: Diagnosis of  By: Walker Kehr MD, Patrick Jupiter    . OSTEOPOROSIS 04/21/2008    Qualifier: Diagnosis of  By: Walker Kehr MD, Patrick Jupiter    . HYPOTHYROIDISM 04/21/2008    Qualifier: Diagnosis of  By: Walker Kehr MD, Patrick Jupiter    . GALLBLADDER DISEASE 05/22/2008    Qualifier: Diagnosis of  By: Burnett Kanaris    . Acute CHF (congestive heart failure) 01/21/2013  . Essential hypertension, benign 04/21/2008    Qualifier: Diagnosis of  By: Walker Kehr MD, Patrick Jupiter    . LBBB 05/23/2008    Qualifier: Diagnosis of  By: Johnsie Cancel, MD, Rona Ravens   . Hyperlipidemia     Past Surgical History  Procedure Laterality Date  .  Cholecystectomy    . Femur im nail  09/09/2011    Procedure: INTRAMEDULLARY (IM) NAIL FEMORAL;  Surgeon: Mcarthur Rossetti, MD;  Location: [facility];  Service: Orthopedics;  Laterality: Left;  distal interlocking screws      Medication List       This list is accurate as of: 12/14/13 11:59 PM.  Always use your most recent med list.               bisacodyl 10 MG suppository  Commonly known as:  DULCOLAX  Place 1 suppository (10 mg total) rectally daily.     calcium-vitamin D 500-200 MG-UNIT per tablet  Commonly known as:  OSCAL WITH D  Take 2 tablets by mouth 2 (two) times daily.     furosemide 20 MG tablet  Commonly known as:  LASIX  Take 20 mg by mouth 2 (two) times daily.     ibuprofen 200 MG tablet  Commonly known as:  ADVIL,MOTRIN  Take 800 mg by mouth every 8 (eight) hours as needed (pain).     levothyroxine 100 MCG tablet  Commonly known as:  SYNTHROID, LEVOTHROID  Take 100 mcg by mouth daily before breakfast.     lipase/protease/amylase 12000 UNITS Cpep capsule  Commonly known as:  CREON  Take 2 capsules by mouth 3 (  three) times daily before meals. And take 12000 units with snacks     LORazepam 0.5 MG tablet  Commonly known as:  ATIVAN  TAKE ONE-HALF TABLET BY MOUTH TWICE DAILY     metoprolol tartrate 12.5 mg Tabs tablet  Commonly known as:  LOPRESSOR  Take 0.5 tablets (12.5 mg total) by mouth 2 (two) times daily.     polyethylene glycol packet  Commonly known as:  MIRALAX / GLYCOLAX  Take 17 g by mouth 2 (two) times daily.     potassium chloride 10 MEQ tablet  Commonly known as:  K-DUR,KLOR-CON  Take 10 mEq by mouth daily.     promethazine 25 MG tablet  Commonly known as:  PHENERGAN  Take 25 mg by mouth every 6 (six) hours as needed for nausea or vomiting.     senna 8.6 MG Tabs tablet  Commonly known as:  SENOKOT  Take 1 tablet (8.6 mg total) by mouth daily.     traMADol 50 MG tablet  Commonly known as:  ULTRAM  Take 1 tablet (50 mg total) by  mouth every 6 (six) hours as needed for moderate pain.     VITAMIN B-12 PO  Take 1 tablet by mouth daily.     Vitamin D (Ergocalciferol) 50000 UNITS Caps capsule  Commonly known as:  DRISDOL  Take 50,000 Units by mouth every 7 (seven) days.        No orders of the defined types were placed in this encounter.    Immunization History  Administered Date(s) Administered  . Influenza,inj,Quad PF,36+ Mos 01/22/2013    History  Substance Use Topics  . Smoking status: Never Smoker   . Smokeless tobacco: Current User    Types: Snuff  . Alcohol Use: No    Family history is noncontributory    Review of Systems  DATA OBTAINED: from patient GENERAL:  no fevers, fatigue, appetite changes SKIN: No itching, rash or wounds EYES: No eye pain, redness, discharge EARS: No earache, tinnitus, change in hearing NOSE: No congestion, drainage or bleeding  MOUTH/THROAT: No mouth or tooth pain, No sore throat RESPIRATORY: No cough, wheezing, SOB CARDIAC: No chest pain, palpitations, lower extremity edema  GI: No abdominal pain, No N/V/D or constipation, No heartburn or reflux  GU: No dysuria, frequency or urgency, or incontinence  MUSCULOSKELETAL: No unrelieved bone/joint pain NEUROLOGIC: No headache, dizziness or focal weakness PSYCHIATRIC: No behavior issue.   Filed Vitals:   12/14/13 1653  BP: 124/74  Pulse: 85  Temp: 97.1 F (36.2 C)  Resp: 20    Physical Exam  GENERAL APPEARANCE: Alert, modconversant,  No acute distress.  SKIN: No diaphoresis rash HEAD: Normocephalic, atraumatic  EYES: Conjunctiva/lids clear. Pupils round, reactive. EOMs intact.  EARS: External exam WNL, canals clear. Hearing grossly normal.  NOSE: No deformity or discharge.  MOUTH/THROAT: Lips w/o lesions  RESPIRATORY: Breathing is even, unlabored. Lung sounds are clear   CARDIOVASCULAR: Heart RRR no murmurs, rubs or gallops. No peripheral edema.   GASTROINTESTINAL: Abdomen is soft, non-tender, not  distended w/ normal bowel sounds. GENITOURINARY: Bladder non tender, not distended  MUSCULOSKELETAL: No abnormal joints or musculature NEUROLOGIC:  Cranial nerves 2-12 grossly intact. Moves all extremities  PSYCHIATRIC: Mood and affect appropriate to situation, no behavioral issues  Patient Active Problem List   Diagnosis Date Noted  . Acute on chronic respiratory failure with hypoxia 12/18/2013  . Syncope 12/18/2013  . Delirium 12/18/2013  . Acute on chronic systolic congestive heart failure 12/09/2013  .  Acute on chronic systolic CHF (congestive heart failure) 12/09/2013  . CHF (congestive heart failure) 12/08/2013  . Chronic systolic heart failure 16/11/9602  . Anxiety 08/25/2013  . Compression fracture of lumbar vertebra, non-traumatic 08/03/2013  . Fracture of lumbar spine 08/03/2013  . Pancreatic insufficiency 03/17/2013  . Ileus 02/21/2013  . Diarrhea 02/21/2013  . Nausea vomiting and diarrhea 02/19/2013  . GERD (gastroesophageal reflux disease) 02/19/2013  . Hypokalemia 02/01/2013  . Acute CHF (congestive heart failure) 01/21/2013  . Closed fracture of left distal femur 09/09/2011    Class: Acute  . EDEMA 05/23/2008  . GALLBLADDER DISEASE 05/22/2008  . Hypothyroidism 04/21/2008  . DEPRESSION/ANXIETY 04/21/2008  . ALCOHOL ABUSE 04/21/2008  . Essential hypertension, benign 04/21/2008  . UTI 04/21/2008  . OSTEOARTHRITIS, KNEE, LEFT, MILD 04/21/2008  . OSTEOPOROSIS 04/21/2008  . ABNORMALITY OF GAIT 04/21/2008  . PELVIC PAIN, RIGHT 04/21/2008  . CLOSED FRACTURE INTERTROCHANTERIC SECTION FEMUR 04/21/2008    CBC    Component Value Date/Time   WBC 5.3 12/09/2013 0533   RBC 3.58* 12/09/2013 0533   HGB 11.2* 12/09/2013 0533   HCT 34.1* 12/09/2013 0533   PLT 129* 12/09/2013 0533   MCV 95.3 12/09/2013 0533   LYMPHSABS 0.9 12/09/2013 0533   MONOABS 0.4 12/09/2013 0533   EOSABS 0.0 12/09/2013 0533   BASOSABS 0.0 12/09/2013 0533    CMP     Component Value Date/Time    NA 137 12/13/2013 0340   K 5.5* 12/13/2013 0340   CL 96 12/13/2013 0340   CO2 28 12/13/2013 0340   GLUCOSE 93 12/13/2013 0340   BUN 14 12/13/2013 0340   CREATININE 0.52 12/13/2013 0340   CALCIUM 8.7 12/13/2013 0340   PROT 5.3* 12/09/2013 0533   ALBUMIN 3.0* 12/09/2013 0533   AST 17 12/09/2013 0533   ALT 10 12/09/2013 0533   ALKPHOS 86 12/09/2013 0533   BILITOT 0.5 12/09/2013 0533   GFRNONAA 83* 12/13/2013 0340   GFRAA >90 12/13/2013 0340    Assessment and Plan  Acute on chronic systolic CHF (congestive heart failure) Last echo in December 2014 showed EF around 20-25%.admitted, and started on IV Lasix.  Significantly compensated. Repeat repeat echocardiogram on 11/6 confirms EF 20-25%. Since significantly compensated, will change back to oral Lasix.  Cardiology consulted during this hospital stay. ACE inhibitors discontinued on discharge because of the hyperkalemia. Consider restarting lisinopril when potassium is okay. Mostly comfort care and family and patient prefers this symptom management.   Ileus Developed abdominal distention on 11/7-abdominal x-ray confirmed ileus.  NG tube placed, keep nothing by mouth.unfortunately, repeatedly asking for diet, attempted to advance diet yesterday, however she developed ileus and was kept NPO.  Dr. Sloan Leiter met with patient, healthcare power of attorney/nephew Cristie Hem and niece at bedside today.  After long discussion, we have decided to mostly pursue comfort measures and allow comfort diet.  Family and patient are accepting all risks, including worsening ileus, peritonitis/bowel perforation and other life threatening and life disabling effects.  MOST form filled, and placed in shadow chart.   Acute on chronic respiratory failure with hypoxia secondary to above, required BiPAP initially, now doing well on O2 via [place].Titrate off O2  Syncope : not clear exactly whether patient passed out prior to this hospitalization. Given  significantly low ejection fraction, VT remains a concern.Seen by cardiology, not a candidate for AICD.   Essential hypertension, benign Lisinopril was d/c in hosp 2/2 hyperkalemia; will not restart, hyperkalemia will just recur again  Hypothyroidism Continue synthroid  DEPRESSION/ANXIETY Continue  lorazepam  Delirium mild dementia at baseline. Stable-continues to have intermittent visual hallucinations.   Pancreatic insufficiency Continue repletion with creon/pancrease    Hennie Duos, MD

## 2013-12-14 NOTE — Plan of Care (Signed)
Problem: Consults Goal: Palliative Care Patient Education Description: See Patient Education module for education specifics. Outcome: Progressing Goal: Skin Care Protocol Initiated - if Braden Score 18 or less If consults are not indicated, leave blank or document N/A Outcome: Completed/Met Date Met:  12/14/13 Goal: Nutrition Consult-if indicated Outcome: Not Applicable Date Met:  09/62/83 Goal: Diabetes Guidelines if Diabetic/Glucose > 140 If diabetic or lab glucose is > 140 mg/dl - Initiate Diabetes/Hyperglycemia Guidelines & Document Interventions  Outcome: Not Applicable Date Met:  66/29/47  Problem: Phase I Progression Outcomes Goal: Pain controlled with appropriate interventions Outcome: Completed/Met Date Met:  12/14/13 Goal: Non-pain symptoms managed Outcome: Completed/Met Date Met:  12/14/13 Goal: Pharmacy Consult if indicated Outcome: Not Applicable Date Met:  65/46/50 Goal: Oral assessment and care per protocol Outcome: Progressing Goal: Initial discharge plan identified Outcome: Completed/Met Date Met:  12/14/13 Goal: Voiding-avoid urinary catheter unless indicated Outcome: Completed/Met Date Met:  12/14/13 Goal: Swallow eval if indicated Outcome: Not Applicable Date Met:  35/46/56 Goal: Respirations unlabored Outcome: Completed/Met Date Met:  12/14/13 Goal: Psychosocial & spiritual needs assessed Outcome: Completed/Met Date Met:  12/14/13 Goal: Goals of care identified Outcome: Completed/Met Date Met:  12/14/13 Goal: Other Phase I Outcomes/Goals Outcome: Not Applicable Date Met:  81/27/51  Problem: Phase II Progression Outcomes Goal: Pain within acceptable level for patient Outcome: Completed/Met Date Met:  12/14/13 Goal: Non-pain symptoms managed Outcome: Completed/Met Date Met:  12/14/13 Goal: Psychosocial & spiritual needs addressed Outcome: Completed/Met Date Met:  12/14/13 Goal: OOB as tolerated unless otherwise ordered Outcome: Adequate for  Discharge Goal: Pt./family involved in care progression Outcome: Completed/Met Date Met:  12/14/13 Goal: Discharge plan established Outcome: Completed/Met Date Met:  12/14/13 Goal: Pain controlled IV/PO/SL/SQ as appropriate Outcome: Completed/Met Date Met:  12/14/13 Goal: Other Phase II Outcomes/Goals Outcome: Not Applicable Date Met:  70/01/74  Problem: Discharge Progression Outcomes Goal: Barriers To Progression Addressed/Resolved Outcome: Progressing Goal: Discharge plan in place and appropriate Outcome: Completed/Met Date Met:  12/14/13 Goal: Pain controlled with appropriate interventions Outcome: Completed/Met Date Met:  12/14/13 Goal: Non-pain symptoms controlled Outcome: Completed/Met Date Met:  94/49/67 Goal: Complications resolved/controlled Outcome: Adequate for Discharge Goal: Activity appropriate for discharge plan Outcome: Adequate for Discharge

## 2013-12-14 NOTE — Progress Notes (Signed)
CSW (Clinical Child psychotherapistocial Worker) received authorization for pt to dc to SNF. Authorization number provided is [id number]. CSW  prepared pt dc packet and placed with shadow chart. CSW arranged non-emergent ambulance transport. Pt, pt family, pt nurse, and facility informed. CSW signing off.  Phillipa Morden, LCSWA [phone]

## 2013-12-14 NOTE — Progress Notes (Addendum)
CSW (Clinical Child psychotherapistocial Worker) has placed multiple calls to [employer]. CSW refaxed clinicals as requested by insurance representative. Awaiting call back with authorization.  Marlei Glomski, LCSWA [phone]

## 2013-12-15 ENCOUNTER — Other Ambulatory Visit: Payer: Self-pay | Admitting: *Deleted

## 2013-12-15 MED ORDER — LORAZEPAM 0.5 MG PO TABS: ORAL_TABLET | ORAL | Status: DC

## 2013-12-15 MED ORDER — TRAMADOL HCL 50 MG PO TABS: ORAL_TABLET | ORAL | Status: AC

## 2013-12-15 NOTE — Telephone Encounter (Signed)
[facility] 

## 2013-12-18 ENCOUNTER — Encounter: Payer: Self-pay | Admitting: Internal Medicine

## 2013-12-18 DIAGNOSIS — J9621 Acute and chronic respiratory failure with hypoxia: Secondary | ICD-10-CM | POA: Insufficient documentation

## 2013-12-18 DIAGNOSIS — F03918 Unspecified dementia, unspecified severity, with other behavioral disturbance: Secondary | ICD-10-CM | POA: Insufficient documentation

## 2013-12-18 DIAGNOSIS — F0391 Unspecified dementia with behavioral disturbance: Secondary | ICD-10-CM | POA: Insufficient documentation

## 2013-12-18 DIAGNOSIS — R55 Syncope and collapse: Secondary | ICD-10-CM | POA: Insufficient documentation

## 2013-12-18 NOTE — Assessment & Plan Note (Signed)
Continue lorazepam 

## 2013-12-18 NOTE — Assessment & Plan Note (Signed)
secondary to above, required BiPAP initially, now doing well on O2 via [place].Titrate off O2

## 2013-12-18 NOTE — Assessment & Plan Note (Signed)
Developed abdominal distention on 11/7-abdominal x-ray confirmed ileus.  NG tube placed, keep nothing by mouth.unfortunately, repeatedly asking for diet, attempted to advance diet yesterday, however she developed ileus and was kept NPO.  Dr. Sloan Leiter met with patient, healthcare power of attorney/nephew Cristie Hem and niece at bedside today.  After long discussion, we have decided to mostly pursue comfort measures and allow comfort diet.  Family and patient are accepting all risks, including worsening ileus, peritonitis/bowel perforation and other life threatening and life disabling effects.  MOST form filled, and placed in shadow chart.

## 2013-12-18 NOTE — Assessment & Plan Note (Signed)
mild dementia at baseline. Stable-continues to have intermittent visual hallucinations.

## 2013-12-18 NOTE — Assessment & Plan Note (Signed)
Lisinopril was d/c in hosp 2/2 hyperkalemia; will not restart, hyperkalemia will just recur again

## 2013-12-18 NOTE — Assessment & Plan Note (Signed)
:   not clear exactly whether patient passed out prior to this hospitalization. Given significantly low ejection fraction, VT remains a concern.Seen by cardiology, not a candidate for AICD.

## 2013-12-18 NOTE — Assessment & Plan Note (Signed)
Continue synthroid.

## 2013-12-18 NOTE — Assessment & Plan Note (Signed)
Continue repletion with creon/pancrease

## 2013-12-18 NOTE — Assessment & Plan Note (Signed)
Last echo in December 2014 showed EF around 20-25%.admitted, and started on IV Lasix.  Significantly compensated. Repeat repeat echocardiogram on 11/6 confirms EF 20-25%. Since significantly compensated, will change back to oral Lasix.  Cardiology consulted during this hospital stay. ACE inhibitors discontinued on discharge because of the hyperkalemia. Consider restarting lisinopril when potassium is okay. Mostly comfort care and family and patient prefers this symptom management.

## 2014-01-04 ENCOUNTER — Encounter: Payer: Self-pay | Admitting: Internal Medicine

## 2014-01-04 ENCOUNTER — Non-Acute Institutional Stay (SKILLED_NURSING_FACILITY): Payer: Medicare Other | Admitting: Internal Medicine

## 2014-01-04 DIAGNOSIS — I10 Essential (primary) hypertension: Secondary | ICD-10-CM

## 2014-01-04 DIAGNOSIS — K219 Gastro-esophageal reflux disease without esophagitis: Secondary | ICD-10-CM

## 2014-01-04 DIAGNOSIS — K868 Other specified diseases of pancreas: Secondary | ICD-10-CM

## 2014-01-04 DIAGNOSIS — J9610 Chronic respiratory failure, unspecified whether with hypoxia or hypercapnia: Secondary | ICD-10-CM | POA: Insufficient documentation

## 2014-01-04 DIAGNOSIS — I5022 Chronic systolic (congestive) heart failure: Secondary | ICD-10-CM

## 2014-01-04 DIAGNOSIS — F341 Dysthymic disorder: Secondary | ICD-10-CM

## 2014-01-04 DIAGNOSIS — J9611 Chronic respiratory failure with hypoxia: Secondary | ICD-10-CM

## 2014-01-04 DIAGNOSIS — E034 Atrophy of thyroid (acquired): Secondary | ICD-10-CM

## 2014-01-04 DIAGNOSIS — E038 Other specified hypothyroidism: Secondary | ICD-10-CM

## 2014-01-04 DIAGNOSIS — K8689 Other specified diseases of pancreas: Secondary | ICD-10-CM

## 2014-01-04 NOTE — Progress Notes (Signed)
MRN: [id number] Name: Elizabeth CarpenterKathleen M [name]  Sex: female Age: 80 y.o. DOB: November 18, 1923  [facility] Facility[facility]: [facility] Level Of Care: SNF Provider: Merrilee SeashoreALEXANDER, ANNE D Emergency Contacts: Extended Emergency Contact Information Primary Emergency Contact: [name],[name] [address]: [address]          [place], [place] [address] [place] Home Phone: [phone] Mobile Phone: [phone] Relation: Nephew Secondary Emergency Contact: [name],[name] Address: [address]          [place], [place] [address] [place] Relation: Niece  Code Status: DNR  Allergies: Review of patient's allergies indicates no known allergies.  Chief Complaint  Patient presents with  . Discharge Note    HPI: Patient is 80 y.o. female who was admitted to SNF for generalized weakness after acute exacerbation of CHF who is now stable to be d/c to home.  Past Medical History  Diagnosis Date  . Thyroid disease   . Abnormality of gait 04/21/2008    Qualifier: Diagnosis of  By: Sheffield SliderHale MD, Deniece PortelaWayne    . ALCOHOL ABUSE 04/21/2008    Qualifier: Diagnosis of  By: Sheffield SliderHale MD, Deniece PortelaWayne    . ANEMIA, SECONDARY TO ACUTE BLOOD LOSS 04/21/2008    Qualifier: Diagnosis of  By: Sheffield SliderHale MD, Deniece PortelaWayne    . DEPRESSION/ANXIETY 04/21/2008    Qualifier: Diagnosis of  By: Sheffield SliderHale MD, Deniece PortelaWayne    . UTI 04/21/2008    Qualifier: Diagnosis of  By: Sheffield SliderHale MD, Deniece PortelaWayne    . CLOSED FRACTURE INTERTROCHANTERIC SECTION FEMUR 04/21/2008    Qualifier: Diagnosis of  By: Sheffield SliderHale MD, Deniece PortelaWayne    . OSTEOARTHRITIS, KNEE, LEFT, MILD 04/21/2008    Qualifier: Diagnosis of  By: Sheffield SliderHale MD, Deniece PortelaWayne    . OSTEOPOROSIS 04/21/2008    Qualifier: Diagnosis of  By: Sheffield SliderHale MD, Deniece PortelaWayne    . HYPOTHYROIDISM 04/21/2008    Qualifier: Diagnosis of  By: Sheffield SliderHale MD, Deniece PortelaWayne    . GALLBLADDER DISEASE 05/22/2008    Qualifier: Diagnosis of  By: Kem ParkinsonBarnes, Kimalexis    . Acute CHF (congestive heart failure) 01/21/2013  . Essential hypertension, benign 04/21/2008    Qualifier:  Diagnosis of  By: Sheffield SliderHale MD, Deniece PortelaWayne    . LBBB 05/23/2008    Qualifier: Diagnosis of  By: Eden EmmsNishan, MD, Harrington ChallengerFACC, Peter Charles   . Hyperlipidemia     Past Surgical History  Procedure Laterality Date  . Cholecystectomy    . Femur im nail  09/09/2011    Procedure: INTRAMEDULLARY (IM) NAIL FEMORAL;  Surgeon: Kathryne Hitchhristopher Y Blackman, MD;  Location: [facility] OR;  Service: Orthopedics;  Laterality: Left;  distal interlocking screws      Medication List       This list is accurate as of: 01/04/14  4:35 PM.  Always use your most recent med list.               busPIRone 5 MG tablet  Commonly known as:  BUSPAR  Take 5 mg by mouth 2 (two) times daily.     calcium-vitamin D 500-200 MG-UNIT per tablet  Commonly known as:  OSCAL WITH D  Take 2 tablets by mouth 2 (two) times daily.     furosemide 20 MG tablet  Commonly known as:  LASIX  Take 20 mg by mouth 2 (two) times daily.     ibuprofen 200 MG tablet  Commonly known as:  ADVIL,MOTRIN  Take 800 mg by mouth every 8 (eight) hours as needed (pain).     levothyroxine 100 MCG  tablet  Commonly known as:  SYNTHROID, LEVOTHROID  Take 100 mcg by mouth daily before breakfast.     lipase/protease/amylase 10960 UNITS Cpep capsule  Commonly known as:  CREON  Take 2 capsules by mouth 3 (three) times daily before meals. And take 45409 units with snacks     LORazepam 0.5 MG tablet  Commonly known as:  ATIVAN  Take 1/2 tablet by mouth twice daily for anxiety     metoprolol tartrate 12.5 mg Tabs tablet  Commonly known as:  LOPRESSOR  Take 0.5 tablets (12.5 mg total) by mouth 2 (two) times daily.     mirtazapine 15 MG tablet  Commonly known as:  REMERON  Take 15 mg by mouth at bedtime.     polyethylene glycol packet  Commonly known as:  MIRALAX / GLYCOLAX  Take 17 g by mouth 2 (two) times daily.     potassium chloride 10 MEQ tablet  Commonly known as:  K-DUR,KLOR-CON  Take 10 mEq by mouth daily.     promethazine 25 MG tablet  Commonly known as:   PHENERGAN  Take 25 mg by mouth every 6 (six) hours as needed for nausea or vomiting.     senna 8.6 MG Tabs tablet  Commonly known as:  SENOKOT  Take 1 tablet (8.6 mg total) by mouth daily.     traMADol 50 MG tablet  Commonly known as:  ULTRAM  Take one tablet by mouth every 6 hours as needed for pain     VITAMIN B-12 PO  Take 1,000 mg by mouth daily.     Vitamin D (Ergocalciferol) 50000 UNITS Caps capsule  Commonly known as:  DRISDOL  Take 50,000 Units by mouth every 7 (seven) days.        Meds ordered this encounter  Medications  . busPIRone (BUSPAR) 5 MG tablet    Sig: Take 5 mg by mouth 2 (two) times daily.  . mirtazapine (REMERON) 15 MG tablet    Sig: Take 15 mg by mouth at bedtime.    Immunization History  Administered Date(s) Administered  . Influenza,inj,Quad PF,36+ Mos 01/22/2013    History  Substance Use Topics  . Smoking status: Never Smoker   . Smokeless tobacco: Current User    Types: Snuff  . Alcohol Use: No    Filed Vitals:   01/04/14 1625  BP: 111/57  Pulse: 64  Temp: 97 F (36.1 C)  Resp: 20    Physical Exam  GENERAL APPEARANCE: Alert, conversant. No acute distress.  HEENT: Unremarkable. RESPIRATORY: Breathing is even, unlabored. Lung sounds are clear   CARDIOVASCULAR: Heart RRR no murmurs, rubs or gallops. No peripheral edema.  GASTROINTESTINAL: Abdomen is soft, non-tender, not distended w/ normal bowel sounds.  NEUROLOGIC: Cranial nerves 2-12 grossly intact. Moves all extremities  Patient Active Problem List   Diagnosis Date Noted  . Chronic respiratory failure 01/04/2014  . Acute on chronic respiratory failure with hypoxia 12/18/2013  . Syncope 12/18/2013  . Delirium 12/18/2013  . Acute on chronic systolic congestive heart failure 12/09/2013  . Acute on chronic systolic CHF (congestive heart failure) 12/09/2013  . CHF (congestive heart failure) 12/08/2013  . Chronic systolic heart failure 08/25/2013  . Anxiety 08/25/2013  .  Compression fracture of lumbar vertebra, non-traumatic 08/03/2013  . Fracture of lumbar spine 08/03/2013  . Pancreatic insufficiency 03/17/2013  . Ileus 02/21/2013  . Diarrhea 02/21/2013  . Nausea vomiting and diarrhea 02/19/2013  . GERD (gastroesophageal reflux disease) 02/19/2013  . Hypokalemia 02/01/2013  .  Acute CHF (congestive heart failure) 01/21/2013  . Closed fracture of left distal femur 09/09/2011    Class: Acute  . EDEMA 05/23/2008  . GALLBLADDER DISEASE 05/22/2008  . Hypothyroidism 04/21/2008  . DEPRESSION/ANXIETY 04/21/2008  . ALCOHOL ABUSE 04/21/2008  . Essential hypertension, benign 04/21/2008  . UTI 04/21/2008  . OSTEOARTHRITIS, KNEE, LEFT, MILD 04/21/2008  . OSTEOPOROSIS 04/21/2008  . ABNORMALITY OF GAIT 04/21/2008  . PELVIC PAIN, RIGHT 04/21/2008  . CLOSED FRACTURE INTERTROCHANTERIC SECTION FEMUR 04/21/2008    CBC    Component Value Date/Time   WBC 5.3 12/09/2013 0533   RBC 3.58* 12/09/2013 0533   HGB 11.2* 12/09/2013 0533   HCT 34.1* 12/09/2013 0533   PLT 129* 12/09/2013 0533   MCV 95.3 12/09/2013 0533   LYMPHSABS 0.9 12/09/2013 0533   MONOABS 0.4 12/09/2013 0533   EOSABS 0.0 12/09/2013 0533   BASOSABS 0.0 12/09/2013 0533    CMP     Component Value Date/Time   NA 137 12/13/2013 0340   K 5.5* 12/13/2013 0340   CL 96 12/13/2013 0340   CO2 28 12/13/2013 0340   GLUCOSE 93 12/13/2013 0340   BUN 14 12/13/2013 0340   CREATININE 0.52 12/13/2013 0340   CALCIUM 8.7 12/13/2013 0340   PROT 5.3* 12/09/2013 0533   ALBUMIN 3.0* 12/09/2013 0533   AST 17 12/09/2013 0533   ALT 10 12/09/2013 0533   ALKPHOS 86 12/09/2013 0533   BILITOT 0.5 12/09/2013 0533   GFRNONAA 83* 12/13/2013 0340   GFRAA >90 12/13/2013 0340    Assessment and Plan  Pt is stable to be discharged to home with HH/OT/PT.  Margit HanksALEXANDER, ANNE D, MD

## 2014-01-30 ENCOUNTER — Other Ambulatory Visit: Payer: Self-pay | Admitting: *Deleted

## 2014-01-30 MED ORDER — LORAZEPAM 0.5 MG PO TABS: ORAL_TABLET | ORAL | Status: AC

## 2014-02-01 ENCOUNTER — Encounter: Payer: Self-pay | Admitting: Internal Medicine

## 2014-02-01 ENCOUNTER — Non-Acute Institutional Stay (SKILLED_NURSING_FACILITY): Payer: Medicare Other | Admitting: Internal Medicine

## 2014-02-01 DIAGNOSIS — K567 Ileus, unspecified: Secondary | ICD-10-CM

## 2014-02-01 NOTE — Assessment & Plan Note (Signed)
First contact with pt was last night over the phone. Pt had vomiting. Nursing performed rectal with passage of foul smelling gas. Pt admitted that she has has a BM yesterday, nl. Nursing noted abd to be tight and distended but after vomiting pt felt much better. KUB/upright was done which was suggestive of ileus. Would like pt on clear liquid diet but she won't do it and is eating breakfast without distress. She says stomach is much smaller and it is  And soft and non tender.Pt has had episode prior and family had elected to let pt eat and she seems OK so far with this approach. Resolving for now, I feel sure this will happen again.

## 2014-02-01 NOTE — Progress Notes (Signed)
MRN: [id number] Name: Rosaura CarpenterKathleen M Fawley  Sex: female Age: 80 y.o. DOB: 1923/02/18  [facility] Facility/Room:[facility] Level Of Care: SNF Provider: Merrilee SeashoreALEXANDER, ANNE D Emergency Contacts: Extended Emergency Contact Information Primary Emergency Contact: [name],[name] Address: [address]          [place], [place] [address] [place] Home Phone: [phone] Mobile Phone: [phone] Relation: Nephew Secondary Emergency Contact: [name],[name] Address: [address]          [place], [place] [address] [place] Relation: Niece  Code Status: DNR  Allergies: Review of patient's allergies indicates no known allergies.  Chief Complaint  Patient presents with  . Acute Visit    HPI: Patient is 80 y.o. female who had vomiting and a distended abdomen last night being seen in follow-up.  Past Medical History  Diagnosis Date  . Thyroid disease   . Abnormality of gait 04/21/2008    Qualifier: Diagnosis of  By: Sheffield SliderHale MD, Deniece PortelaWayne    . ALCOHOL ABUSE 04/21/2008    Qualifier: Diagnosis of  By: Sheffield SliderHale MD, Deniece PortelaWayne    . ANEMIA, SECONDARY TO ACUTE BLOOD LOSS 04/21/2008    Qualifier: Diagnosis of  By: Sheffield SliderHale MD, Deniece PortelaWayne    . DEPRESSION/ANXIETY 04/21/2008    Qualifier: Diagnosis of  By: Sheffield SliderHale MD, Deniece PortelaWayne    . UTI 04/21/2008    Qualifier: Diagnosis of  By: Sheffield SliderHale MD, Deniece PortelaWayne    . CLOSED FRACTURE INTERTROCHANTERIC SECTION FEMUR 04/21/2008    Qualifier: Diagnosis of  By: Sheffield SliderHale MD, Deniece PortelaWayne    . OSTEOARTHRITIS, KNEE, LEFT, MILD 04/21/2008    Qualifier: Diagnosis of  By: Sheffield SliderHale MD, Deniece PortelaWayne    . OSTEOPOROSIS 04/21/2008    Qualifier: Diagnosis of  By: Sheffield SliderHale MD, Deniece PortelaWayne    . HYPOTHYROIDISM 04/21/2008    Qualifier: Diagnosis of  By: Sheffield SliderHale MD, Deniece PortelaWayne    . GALLBLADDER DISEASE 05/22/2008    Qualifier: Diagnosis of  By: Kem ParkinsonBarnes, Kimalexis    . Acute CHF (congestive heart failure) 01/21/2013  . Essential hypertension, benign 04/21/2008    Qualifier: Diagnosis of  By: Sheffield SliderHale MD, Deniece PortelaWayne    . LBBB  05/23/2008    Qualifier: Diagnosis of  By: Eden EmmsNishan, MD, Harrington ChallengerFACC, Peter Charles   . Hyperlipidemia     Past Surgical History  Procedure Laterality Date  . Cholecystectomy    . Femur im nail  09/09/2011    Procedure: INTRAMEDULLARY (IM) NAIL FEMORAL;  Surgeon: Kathryne Hitchhristopher Y Blackman, MD;  Location: [facility] OR;  Service: Orthopedics;  Laterality: Left;  distal interlocking screws      Medication List       This list is accurate as of: 02/01/14 12:15 PM.  Always use your most recent med list.               busPIRone 5 MG tablet  Commonly known as:  BUSPAR  Take 5 mg by mouth 2 (two) times daily.     calcium-vitamin D 500-200 MG-UNIT per tablet  Commonly known as:  OSCAL WITH D  Take 2 tablets by mouth 2 (two) times daily.     furosemide 20 MG tablet  Commonly known as:  LASIX  Take 20 mg by mouth 2 (two) times daily.     ibuprofen 200 MG tablet  Commonly known as:  ADVIL,MOTRIN  Take 800 mg by mouth every 8 (eight) hours as needed (pain).     levothyroxine 100 MCG tablet  Commonly known as:  SYNTHROID, LEVOTHROID  Take 100  mcg by mouth daily before breakfast.     lipase/protease/amylase 6578412000 UNITS Cpep capsule  Commonly known as:  CREON  Take 2 capsules by mouth 3 (three) times daily before meals. And take [id number] units with snacks     LORazepam 0.5 MG tablet  Commonly known as:  ATIVAN  Take 1/2 tablet by mouth twice daily for anxiety     metoprolol tartrate 12.5 mg Tabs tablet  Commonly known as:  LOPRESSOR  Take 0.5 tablets (12.5 mg total) by mouth 2 (two) times daily.     mirtazapine 15 MG tablet  Commonly known as:  REMERON  Take 15 mg by mouth at bedtime.     polyethylene glycol packet  Commonly known as:  MIRALAX / GLYCOLAX  Take 17 g by mouth 2 (two) times daily.     potassium chloride 10 MEQ tablet  Commonly known as:  K-DUR,KLOR-CON  Take 10 mEq by mouth daily.     promethazine 25 MG tablet  Commonly known as:  PHENERGAN  Take 25 mg by mouth every 6 (six)  hours as needed for nausea or vomiting.     senna 8.6 MG Tabs tablet  Commonly known as:  SENOKOT  Take 1 tablet (8.6 mg total) by mouth daily.     traMADol 50 MG tablet  Commonly known as:  ULTRAM  Take one tablet by mouth every 6 hours as needed for pain     VITAMIN B-12 PO  Take 1,000 mg by mouth daily.     Vitamin D (Ergocalciferol) 50000 UNITS Caps capsule  Commonly known as:  DRISDOL  Take 50,000 Units by mouth every 7 (seven) days.        No orders of the defined types were placed in this encounter.    Immunization History  Administered Date(s) Administered  . Influenza,inj,Quad PF,36+ Mos 01/22/2013    History  Substance Use Topics  . Smoking status: Never Smoker   . Smokeless tobacco: Current User    Types: Snuff  . Alcohol Use: No    Review of Systems  DATA OBTAINED: from patient, nurse; last night per phone nurse reported distended abd with vomiting but passage of gas with rectal exam;today py is eating breakfast and feels fine GENERAL:  no fevers, fatigue, appetite changes SKIN: No itching, rash HEENT: No complaint RESPIRATORY: No cough, wheezing, SOB CARDIAC: No chest pain, palpitations, lower extremity edema  GI: No abdominal pain, No N/V/D or constipation, No heartburn or reflux ; adb is much flatter and smaller GU: No dysuria, frequency or urgency, or incontinence  MUSCULOSKELETAL: No unrelieved bone/joint pain NEUROLOGIC: No headache, dizziness  PSYCHIATRIC: No overt anxiety or sadness  Filed Vitals:   02/01/14 1207  BP: 114/63  Pulse: 66  Temp: 98.3 F (36.8 C)  Resp: 18    Physical Exam  GENERAL APPEARANCE: Alert, conversant, No acute distress  SKIN: No diaphoresis rash, HEENT: Unremarkable RESPIRATORY: Breathing is even, unlabored. Lung sounds are clear   CARDIOVASCULAR: Heart RRR no murmurs, rubs or gallops. No peripheral edema  GASTROINTESTINAL: Abdomen is soft, non-tender, mild distended w/ decreased but present  BS GENITOURINARY: Bladder non tender, not distended  MUSCULOSKELETAL: No abnormal joints or musculature NEUROLOGIC: Cranial nerves 2-12 grossly intact. Moves all extremities PSYCHIATRIC: Mood and affect appropriate to situation, no behavioral issues  Patient Active Problem List   Diagnosis Date Noted  . Chronic respiratory failure 01/04/2014  . Acute on chronic respiratory failure with hypoxia 12/18/2013  . Syncope 12/18/2013  . Delirium  12/18/2013  . Acute on chronic systolic congestive heart failure 12/09/2013  . Acute on chronic systolic CHF (congestive heart failure) 12/09/2013  . CHF (congestive heart failure) 12/08/2013  . Chronic systolic heart failure 08/25/2013  . Anxiety 08/25/2013  . Compression fracture of lumbar vertebra, non-traumatic 08/03/2013  . Fracture of lumbar spine 08/03/2013  . Pancreatic insufficiency 03/17/2013  . Ileus 02/21/2013  . Diarrhea 02/21/2013  . Nausea vomiting and diarrhea 02/19/2013  . GERD (gastroesophageal reflux disease) 02/19/2013  . Hypokalemia 02/01/2013  . Acute CHF (congestive heart failure) 01/21/2013  . Closed fracture of left distal femur 09/09/2011    Class: Acute  . EDEMA 05/23/2008  . GALLBLADDER DISEASE 05/22/2008  . Hypothyroidism 04/21/2008  . DEPRESSION/ANXIETY 04/21/2008  . ALCOHOL ABUSE 04/21/2008  . Essential hypertension, benign 04/21/2008  . UTI 04/21/2008  . OSTEOARTHRITIS, KNEE, LEFT, MILD 04/21/2008  . OSTEOPOROSIS 04/21/2008  . ABNORMALITY OF GAIT 04/21/2008  . PELVIC PAIN, RIGHT 04/21/2008  . CLOSED FRACTURE INTERTROCHANTERIC SECTION FEMUR 04/21/2008    CBC    Component Value Date/Time   WBC 5.3 12/09/2013 0533   RBC 3.58* 12/09/2013 0533   HGB 11.2* 12/09/2013 0533   HCT 34.1* 12/09/2013 0533   PLT 129* 12/09/2013 0533   MCV 95.3 12/09/2013 0533   LYMPHSABS 0.9 12/09/2013 0533   MONOABS 0.4 12/09/2013 0533   EOSABS 0.0 12/09/2013 0533   BASOSABS 0.0 12/09/2013 0533    CMP     Component Value  Date/Time   NA 137 12/13/2013 0340   K 5.5* 12/13/2013 0340   CL 96 12/13/2013 0340   CO2 28 12/13/2013 0340   GLUCOSE 93 12/13/2013 0340   BUN 14 12/13/2013 0340   CREATININE 0.52 12/13/2013 0340   CALCIUM 8.7 12/13/2013 0340   PROT 5.3* 12/09/2013 0533   ALBUMIN 3.0* 12/09/2013 0533   AST 17 12/09/2013 0533   ALT 10 12/09/2013 0533   ALKPHOS 86 12/09/2013 0533   BILITOT 0.5 12/09/2013 0533   GFRNONAA 83* 12/13/2013 0340   GFRAA >90 12/13/2013 0340    Assessment and Plan  Ileus First contact with pt was last night over the phone. Pt had vomiting. Nursing performed rectal with passage of foul smelling gas. Pt admitted that she has has a BM yesterday, nl. Nursing noted abd to be tight and distended but after vomiting pt felt much better. KUB/upright was done which was suggestive of ileus. Would like pt on clear liquid diet but she won't do it and is eating breakfast without distress. She says stomach is much smaller and it is  And soft and non tender.Pt has had episode prior and family had elected to let pt eat and she seems OK so far with this approach. Resolving for now, I feel sure this will happen again.    Margit Hanks, MD

## 2014-03-10 ENCOUNTER — Non-Acute Institutional Stay (SKILLED_NURSING_FACILITY): Payer: Medicare Other | Admitting: Internal Medicine

## 2014-03-10 ENCOUNTER — Encounter: Payer: Self-pay | Admitting: Internal Medicine

## 2014-03-10 DIAGNOSIS — F419 Anxiety disorder, unspecified: Secondary | ICD-10-CM

## 2014-03-10 DIAGNOSIS — I10 Essential (primary) hypertension: Secondary | ICD-10-CM

## 2014-03-10 DIAGNOSIS — E034 Atrophy of thyroid (acquired): Secondary | ICD-10-CM

## 2014-03-10 DIAGNOSIS — K8689 Other specified diseases of pancreas: Secondary | ICD-10-CM

## 2014-03-10 DIAGNOSIS — F329 Major depressive disorder, single episode, unspecified: Secondary | ICD-10-CM

## 2014-03-10 DIAGNOSIS — F32A Depression, unspecified: Secondary | ICD-10-CM

## 2014-03-10 DIAGNOSIS — K567 Ileus, unspecified: Secondary | ICD-10-CM

## 2014-03-10 DIAGNOSIS — J9611 Chronic respiratory failure with hypoxia: Secondary | ICD-10-CM

## 2014-03-10 DIAGNOSIS — E038 Other specified hypothyroidism: Secondary | ICD-10-CM

## 2014-03-10 DIAGNOSIS — K868 Other specified diseases of pancreas: Secondary | ICD-10-CM

## 2014-03-10 DIAGNOSIS — F03918 Unspecified dementia, unspecified severity, with other behavioral disturbance: Secondary | ICD-10-CM

## 2014-03-10 DIAGNOSIS — I5022 Chronic systolic (congestive) heart failure: Secondary | ICD-10-CM

## 2014-03-10 DIAGNOSIS — F0391 Unspecified dementia with behavioral disturbance: Secondary | ICD-10-CM

## 2014-03-10 NOTE — Assessment & Plan Note (Signed)
Chronic and stable with no known exacerbations; on lasix and bblocker;Plan- continue lasix and metoprolol

## 2014-03-10 NOTE — Assessment & Plan Note (Signed)
2/2 to prior ETOH abuse;chronic and stable without problems on creon/pancrease

## 2014-03-10 NOTE — Assessment & Plan Note (Addendum)
Pt's confusion waxes and wanes but no major decline

## 2014-03-10 NOTE — Progress Notes (Signed)
MRN: [id number] Name: Elizabeth [name]  Sex: female Age: 81 y.o. DOB: Feb 27, 1923  [facility] #: [name] Facility/Room: 202 Level Of Care: SNF Provider: Merrilee Seashore D Emergency Contacts: Extended Emergency Contact Information Primary Emergency Contact: [name],[name] Address: [address]          [place], [place] [address] [place] Home Phone: [phone] Mobile Phone: [phone] Relation: Nephew Secondary Emergency Contact: [name],[name] Address: [address] [place], [place] [address] [name] Relation: Niece  Code Status:DNR   Allergies: Review of patient's allergies indicates no known allergies.  Chief Complaint  Patient presents with  . Medical Management of Chronic Issues    HPI: Patient is 81 y.o. female who is being seen for routine issues.  Past Medical History  Diagnosis Date  . Thyroid disease   . Abnormality of gait 04/21/2008    Qualifier: Diagnosis of  By: Sheffield Slider MD, Deniece Portela    . ALCOHOL ABUSE 04/21/2008    Qualifier: Diagnosis of  By: Sheffield Slider MD, Deniece Portela    . ANEMIA, SECONDARY TO ACUTE BLOOD LOSS 04/21/2008    Qualifier: Diagnosis of  By: Sheffield Slider MD, Deniece Portela    . DEPRESSION/ANXIETY 04/21/2008    Qualifier: Diagnosis of  By: Sheffield Slider MD, Deniece Portela    . UTI 04/21/2008    Qualifier: Diagnosis of  By: Sheffield Slider MD, Deniece Portela    . CLOSED FRACTURE INTERTROCHANTERIC SECTION FEMUR 04/21/2008    Qualifier: Diagnosis of  By: Sheffield Slider MD, Deniece Portela    . OSTEOARTHRITIS, KNEE, LEFT, MILD 04/21/2008    Qualifier: Diagnosis of  By: Sheffield Slider MD, Deniece Portela    . OSTEOPOROSIS 04/21/2008    Qualifier: Diagnosis of  By: Sheffield Slider MD, Deniece Portela    . HYPOTHYROIDISM 04/21/2008    Qualifier: Diagnosis of  By: Sheffield Slider MD, Deniece Portela    . GALLBLADDER DISEASE 05/22/2008    Qualifier: Diagnosis of  By: Kem Parkinson    . Acute CHF (congestive heart failure) 01/21/2013  . Essential hypertension, benign 04/21/2008    Qualifier: Diagnosis of  By: Sheffield Slider MD, Deniece Portela    . LBBB 05/23/2008   Qualifier: Diagnosis of  By: Eden Emms, MD, Harrington Challenger   . Hyperlipidemia     Past Surgical History  Procedure Laterality Date  . Cholecystectomy    . Femur im nail  09/09/2011    Procedure: INTRAMEDULLARY (IM) NAIL FEMORAL;  Surgeon: Kathryne Hitch, MD;  Location: [facility] OR;  Service: Orthopedics;  Laterality: Left;  distal interlocking screws      Medication List       This list is accurate as of: 03/10/14  6:55 PM.  Always use your most recent med list.               busPIRone 5 MG tablet  Commonly known as:  BUSPAR  Take 5 mg by mouth 2 (two) times daily.     calcium-vitamin D 500-200 MG-UNIT per tablet  Commonly known as:  OSCAL WITH D  Take 2 tablets by mouth 2 (two) times daily.     furosemide 20 MG tablet  Commonly known as:  LASIX  Take 20 mg by mouth 2 (two) times daily.     ibuprofen 200 MG tablet  Commonly known as:  ADVIL,MOTRIN  Take 800 mg by mouth every 8 (eight) hours as needed (pain).     levothyroxine 100 MCG tablet  Commonly known as:  SYNTHROID, LEVOTHROID  Take 100 mcg by mouth  daily before breakfast.     lipase/protease/amylase [id number] UNITS Cpep capsule  Commonly known as:  CREON  Take 2 capsules by mouth 3 (three) times daily before meals. And take [id number] units with snacks     LORazepam 0.5 MG tablet  Commonly known as:  ATIVAN  Take 1/2 tablet by mouth twice daily for anxiety     metoprolol tartrate 12.5 mg Tabs tablet  Commonly known as:  LOPRESSOR  Take 0.5 tablets (12.5 mg total) by mouth 2 (two) times daily.     mirtazapine 15 MG tablet  Commonly known as:  REMERON  Take 15 mg by mouth at bedtime.     polyethylene glycol packet  Commonly known as:  MIRALAX / GLYCOLAX  Take 17 g by mouth 2 (two) times daily.     potassium chloride 10 MEQ tablet  Commonly known as:  K-DUR,KLOR-CON  Take 10 mEq by mouth daily.     promethazine 25 MG tablet  Commonly known as:  PHENERGAN  Take 25 mg by mouth every 6 (six) hours as needed  for nausea or vomiting.     senna 8.6 MG Tabs tablet  Commonly known as:  SENOKOT  Take 1 tablet (8.6 mg total) by mouth daily.     traMADol 50 MG tablet  Commonly known as:  ULTRAM  Take one tablet by mouth every 6 hours as needed for pain     VITAMIN B-12 PO  Take 1,000 mg by mouth daily.     Vitamin D (Ergocalciferol) 50000 UNITS Caps capsule  Commonly known as:  DRISDOL  Take 50,000 Units by mouth every 7 (seven) days.        No orders of the defined types were placed in this encounter.    Immunization History  Administered Date(s) Administered  . Influenza,inj,Quad PF,36+ Mos 01/22/2013    History  Substance Use Topics  . Smoking status: Never Smoker   . Smokeless tobacco: Current User    Types: Snuff  . Alcohol Use: No    Review of Systems  UTO meaningful    Filed Vitals:   03/10/14 1538  BP: 114/63  Pulse: 66  Temp: 98.3 F (36.8 C)  Resp: 18    Physical Exam  GENERAL APPEARANCE: Alert, modconversant, No acute distress, thin WF wearing O2  SKIN: No diaphoresis rash HEENT: Unremarkable RESPIRATORY: Breathing is even, unlabored. Lung sounds are clear   CARDIOVASCULAR: Heart RRR no murmurs, rubs or gallops. No peripheral edema  GASTROINTESTINAL: Abdomen is soft, non-tender, not distended w/ normal bowel sounds.  GENITOURINARY: Bladder non tender, not distended  MUSCULOSKELETAL: very thin NEUROLOGIC: Cranial nerves 2-12 grossly intact. Moves all extremities PSYCHIATRIC: dementia, no behavioral issues  Patient Active Problem List   Diagnosis Date Noted  . Chronic respiratory failure 01/04/2014  . Acute on chronic respiratory failure with hypoxia 12/18/2013  . Syncope 12/18/2013  . Dementia with behavioral disturbance 12/18/2013  . Acute on chronic systolic congestive heart failure 12/09/2013  . Acute on chronic systolic CHF (congestive heart failure) 12/09/2013  . CHF (congestive heart failure) 12/08/2013  . Chronic systolic heart failure  08/25/2013  . Anxiety 08/25/2013  . Compression fracture of lumbar vertebra, non-traumatic 08/03/2013  . Fracture of lumbar spine 08/03/2013  . Pancreatic insufficiency 03/17/2013  . Ileus 02/21/2013  . Diarrhea 02/21/2013  . Nausea vomiting and diarrhea 02/19/2013  . GERD (gastroesophageal reflux disease) 02/19/2013  . Hypokalemia 02/01/2013  . Acute CHF (congestive heart failure) 01/21/2013  . Closed fracture of left  distal femur 09/09/2011    Class: Acute  . EDEMA 05/23/2008  . GALLBLADDER DISEASE 05/22/2008  . Hypothyroidism 04/21/2008  . Depression 04/21/2008  . ALCOHOL ABUSE 04/21/2008  . Essential hypertension, benign 04/21/2008  . UTI 04/21/2008  . OSTEOARTHRITIS, KNEE, LEFT, MILD 04/21/2008  . OSTEOPOROSIS 04/21/2008  . ABNORMALITY OF GAIT 04/21/2008  . PELVIC PAIN, RIGHT 04/21/2008  . CLOSED FRACTURE INTERTROCHANTERIC SECTION FEMUR 04/21/2008    CBC    Component Value Date/Time   WBC 5.3 12/09/2013 0533   RBC 3.58* 12/09/2013 0533   HGB 11.2* 12/09/2013 0533   HCT 34.1* 12/09/2013 0533   PLT 129* 12/09/2013 0533   MCV 95.3 12/09/2013 0533   LYMPHSABS 0.9 12/09/2013 0533   MONOABS 0.4 12/09/2013 0533   EOSABS 0.0 12/09/2013 0533   BASOSABS 0.0 12/09/2013 0533    CMP     Component Value Date/Time   NA 137 12/13/2013 0340   K 5.5* 12/13/2013 0340   CL 96 12/13/2013 0340   CO2 28 12/13/2013 0340   GLUCOSE 93 12/13/2013 0340   BUN 14 12/13/2013 0340   CREATININE 0.52 12/13/2013 0340   CALCIUM 8.7 12/13/2013 0340   PROT 5.3* 12/09/2013 0533   ALBUMIN 3.0* 12/09/2013 0533   AST 17 12/09/2013 0533   ALT 10 12/09/2013 0533   ALKPHOS 86 12/09/2013 0533   BILITOT 0.5 12/09/2013 0533   GFRNONAA 83* 12/13/2013 0340   GFRAA >90 12/13/2013 0340    Assessment and Plan  Chronic systolic heart failure Chronic and stable with no known exacerbations; on lasix and bblocker;Plan- continue lasix and metoprolol   Essential hypertension, benign Well controlled  on metoprlol 12.5 mg BID   Chronic respiratory failure Chronic and stable; pt wears O2 most of the time;maintained without routine nebs or MDI   Ileus There have been no recurrences since last visit;will continue to monitor   Hypothyroidism Stable on synthroid 100 mcg; TSH 4.28 in 08/2013; order new TSH   Dementia with behavioral disturbance Pt's confusion waxes and wanes but no major decline   Anxiety Chronic and stable;controlled on buspar and lorazepam   Pancreatic insufficiency 2/2 to prior ETOH abuse;chronic and stable without problems on creon/pancrease   Depression Continue remeron 30 mg qHS     Margit Hanks, MD

## 2014-03-10 NOTE — Assessment & Plan Note (Signed)
There have been no recurrences since last visit;will continue to monitor

## 2014-03-10 NOTE — Assessment & Plan Note (Signed)
Well controlled on metoprlol 12.5 mg BID

## 2014-03-10 NOTE — Assessment & Plan Note (Signed)
Stable on synthroid 100 mcg; TSH 4.28 in 08/2013; order new TSH

## 2014-03-10 NOTE — Assessment & Plan Note (Signed)
Chronic and stable; pt wears O2 most of the time;maintained without routine nebs or MDI

## 2014-03-10 NOTE — Assessment & Plan Note (Signed)
Chronic and stable;controlled on buspar and lorazepam

## 2014-03-10 NOTE — Assessment & Plan Note (Signed)
Continue remeron 30 mg qHS

## 2014-05-12 ENCOUNTER — Encounter: Payer: Self-pay | Admitting: Internal Medicine

## 2014-05-12 ENCOUNTER — Non-Acute Institutional Stay (SKILLED_NURSING_FACILITY): Payer: Medicare Other | Admitting: Internal Medicine

## 2014-05-12 DIAGNOSIS — E538 Deficiency of other specified B group vitamins: Secondary | ICD-10-CM

## 2014-05-12 DIAGNOSIS — E559 Vitamin D deficiency, unspecified: Secondary | ICD-10-CM

## 2014-05-12 DIAGNOSIS — E034 Atrophy of thyroid (acquired): Secondary | ICD-10-CM

## 2014-05-12 DIAGNOSIS — F419 Anxiety disorder, unspecified: Secondary | ICD-10-CM

## 2014-05-12 DIAGNOSIS — M810 Age-related osteoporosis without current pathological fracture: Secondary | ICD-10-CM

## 2014-05-12 DIAGNOSIS — F329 Major depressive disorder, single episode, unspecified: Secondary | ICD-10-CM

## 2014-05-12 DIAGNOSIS — E038 Other specified hypothyroidism: Secondary | ICD-10-CM

## 2014-05-12 DIAGNOSIS — F32A Depression, unspecified: Secondary | ICD-10-CM

## 2014-05-12 NOTE — Progress Notes (Signed)
MRN: [id number] Name: Elizabeth [name]  Sex: female Age: 81 y.o. DOB: Jun 08, 1923  [name] Facility/Room:202D Level Of Care: SNF Provider: Merrilee Seashore D Emergency Contacts: Extended Emergency Contact Information Primary Emergency Contact: [name],[name] Address: [address] [facility]          [place], [place] [address] [place] Home Phone: [phone] Mobile Phone: [phone] Relation: Nephew Secondary Emergency Contact: [name],[name] Address: [address] [place], [place] [address] [name] Relation: Niece  Code Status: DNR  Allergies: Review of patient's allergies indicates no known allergies.  Chief Complaint  Patient presents with  . Medical Management of Chronic Issues    HPI: Patient is 81 y.o. female who is being seen for routine issues.  Past Medical History  Diagnosis Date  . Thyroid disease   . Abnormality of gait 04/21/2008    Qualifier: Diagnosis of  By: Sheffield Slider MD, Deniece Portela    . ALCOHOL ABUSE 04/21/2008    Qualifier: Diagnosis of  By: Sheffield Slider MD, Deniece Portela    . ANEMIA, SECONDARY TO ACUTE BLOOD LOSS 04/21/2008    Qualifier: Diagnosis of  By: Sheffield Slider MD, Deniece Portela    . DEPRESSION/ANXIETY 04/21/2008    Qualifier: Diagnosis of  By: Sheffield Slider MD, Deniece Portela    . UTI 04/21/2008    Qualifier: Diagnosis of  By: Sheffield Slider MD, Deniece Portela    . CLOSED FRACTURE INTERTROCHANTERIC SECTION FEMUR 04/21/2008    Qualifier: Diagnosis of  By: Sheffield Slider MD, Deniece Portela    . OSTEOARTHRITIS, KNEE, LEFT, MILD 04/21/2008    Qualifier: Diagnosis of  By: Sheffield Slider MD, Deniece Portela    . OSTEOPOROSIS 04/21/2008    Qualifier: Diagnosis of  By: Sheffield Slider MD, Deniece Portela    . HYPOTHYROIDISM 04/21/2008    Qualifier: Diagnosis of  By: Sheffield Slider MD, Deniece Portela    . GALLBLADDER DISEASE 05/22/2008    Qualifier: Diagnosis of  By: Kem Parkinson    . Acute CHF (congestive heart failure) 01/21/2013  . Essential hypertension, benign 04/21/2008    Qualifier: Diagnosis of  By: Sheffield Slider MD, Deniece Portela    . LBBB 05/23/2008   Qualifier: Diagnosis of  By: Eden Emms, MD, Harrington Challenger   . Hyperlipidemia     Past Surgical History  Procedure Laterality Date  . Cholecystectomy    . Femur im nail  09/09/2011    Procedure: INTRAMEDULLARY (IM) NAIL FEMORAL;  Surgeon: Kathryne Hitch, MD;  Location: [facility] OR;  Service: Orthopedics;  Laterality: Left;  distal interlocking screws      Medication List       This list is accurate as of: 05/12/14 11:21 AM.  Always use your most recent med list.               busPIRone 5 MG tablet  Commonly known as:  BUSPAR  Take 5 mg by mouth 2 (two) times daily.     calcium-vitamin D 500-200 MG-UNIT per tablet  Commonly known as:  OSCAL WITH D  Take 2 tablets by mouth 2 (two) times daily.     furosemide 20 MG tablet  Commonly known as:  LASIX  Take 20 mg by mouth 2 (two) times daily.     ibuprofen 200 MG tablet  Commonly known as:  ADVIL,MOTRIN  Take 800 mg by mouth every 8 (eight) hours as needed (pain).     levothyroxine 100 MCG tablet  Commonly known as:  SYNTHROID, LEVOTHROID  Take 100 mcg by mouth daily before  breakfast.     lipase/protease/amylase [id number] UNITS Cpep capsule  Commonly known as:  CREON  Take 2 capsules by mouth 3 (three) times daily before meals. And take [id number] units with snacks     LORazepam 0.5 MG tablet  Commonly known as:  ATIVAN  Take 1/2 tablet by mouth twice daily for anxiety     metoprolol tartrate 12.5 mg Tabs tablet  Commonly known as:  LOPRESSOR  Take 0.5 tablets (12.5 mg total) by mouth 2 (two) times daily.     mirtazapine 15 MG tablet  Commonly known as:  REMERON  Take 15 mg by mouth at bedtime.     polyethylene glycol packet  Commonly known as:  MIRALAX / GLYCOLAX  Take 17 g by mouth 2 (two) times daily.     potassium chloride 10 MEQ tablet  Commonly known as:  K-DUR,KLOR-CON  Take 10 mEq by mouth daily.     promethazine 25 MG tablet  Commonly known as:  PHENERGAN  Take 25 mg by mouth every 6 (six) hours as needed  for nausea or vomiting.     senna 8.6 MG Tabs tablet  Commonly known as:  SENOKOT  Take 1 tablet (8.6 mg total) by mouth daily.     traMADol 50 MG tablet  Commonly known as:  ULTRAM  Take one tablet by mouth every 6 hours as needed for pain     VITAMIN B-12 PO  Take 1,000 mg by mouth daily.     Vitamin D (Ergocalciferol) 50000 UNITS Caps capsule  Commonly known as:  DRISDOL  Take 50,000 Units by mouth every 30 (thirty) days.        No orders of the defined types were placed in this encounter.    Immunization History  Administered Date(s) Administered  . Influenza,inj,Quad PF,36+ Mos 01/22/2013    History  Substance Use Topics  . Smoking status: Never Smoker   . Smokeless tobacco: Current User    Types: Snuff  . Alcohol Use: No    Review of Systems  DATA OBTAINED: from patient, nurse, medical record GENERAL:  no fevers, fatigue, appetite changes SKIN: No itching, rash HEENT: No complaint RESPIRATORY: No cough, wheezing, SOB CARDIAC: No chest pain, palpitations, lower extremity edema  GI: No abdominal pain, No N/V/D or constipation, No heartburn or reflux  GU: No dysuria, frequency or urgency, or incontinence  MUSCULOSKELETAL: No unrelieved bone/joint pain NEUROLOGIC: No headache, dizziness  PSYCHIATRIC: feels sad  Filed Vitals:   05/12/14 1058  BP: 110/65  Pulse: 64  Temp: 97.6 F (36.4 C)  Resp: 22    Physical Exam  GENERAL APPEARANCE: Alert, conversant, No acute distress  SKIN: No diaphoresis rash HEENT: Unremarkable RESPIRATORY: Breathing is even, unlabored. Lung sounds are clear   CARDIOVASCULAR: Heart RRR no murmurs, rubs or gallops. No peripheral edema  GASTROINTESTINAL: Abdomen is soft, non-tender, not distended w/ normal bowel sounds.  GENITOURINARY: Bladder non tender, not distended  MUSCULOSKELETAL:very thin NEUROLOGIC: Cranial nerves 2-12 grossly intact PSYCHIATRIC: dementia, no behavioral issues  Patient Active Problem List    Diagnosis Date Noted  . Vitamin D deficiency 05/12/2014  . Vitamin B12 deficiency 05/12/2014  . Chronic respiratory failure 01/04/2014  . Acute on chronic respiratory failure with hypoxia 12/18/2013  . Syncope 12/18/2013  . Dementia with behavioral disturbance 12/18/2013  . Acute on chronic systolic congestive heart failure 12/09/2013  . Acute on chronic systolic CHF (congestive heart failure) 12/09/2013  . CHF (congestive heart failure) 12/08/2013  . Chronic systolic  heart failure 08/25/2013  . Anxiety 08/25/2013  . Compression fracture of lumbar vertebra, non-traumatic 08/03/2013  . Fracture of lumbar spine 08/03/2013  . Pancreatic insufficiency 03/17/2013  . Ileus 02/21/2013  . Diarrhea 02/21/2013  . Nausea vomiting and diarrhea 02/19/2013  . GERD (gastroesophageal reflux disease) 02/19/2013  . Hypokalemia 02/01/2013  . Acute CHF (congestive heart failure) 01/21/2013  . Closed fracture of left distal femur 09/09/2011    Class: Acute  . EDEMA 05/23/2008  . GALLBLADDER DISEASE 05/22/2008  . Hypothyroidism 04/21/2008  . Depression 04/21/2008  . ALCOHOL ABUSE 04/21/2008  . Essential hypertension, benign 04/21/2008  . UTI 04/21/2008  . OSTEOARTHRITIS, KNEE, LEFT, MILD 04/21/2008  . Osteoporosis 04/21/2008  . ABNORMALITY OF GAIT 04/21/2008  . PELVIC PAIN, RIGHT 04/21/2008  . CLOSED FRACTURE INTERTROCHANTERIC SECTION FEMUR 04/21/2008    CBC    Component Value Date/Time   WBC 5.3 12/09/2013 0533   RBC 3.58* 12/09/2013 0533   HGB 11.2* 12/09/2013 0533   HCT 34.1* 12/09/2013 0533   PLT 129* 12/09/2013 0533   MCV 95.3 12/09/2013 0533   LYMPHSABS 0.9 12/09/2013 0533   MONOABS 0.4 12/09/2013 0533   EOSABS 0.0 12/09/2013 0533   BASOSABS 0.0 12/09/2013 0533    CMP     Component Value Date/Time   NA 137 12/13/2013 0340   K 5.5* 12/13/2013 0340   CL 96 12/13/2013 0340   CO2 28 12/13/2013 0340   GLUCOSE 93 12/13/2013 0340   BUN 14 12/13/2013 0340   CREATININE 0.52  12/13/2013 0340   CALCIUM 8.7 12/13/2013 0340   PROT 5.3* 12/09/2013 0533   ALBUMIN 3.0* 12/09/2013 0533   AST 17 12/09/2013 0533   ALT 10 12/09/2013 0533   ALKPHOS 86 12/09/2013 0533   BILITOT 0.5 12/09/2013 0533   GFRNONAA 83* 12/13/2013 0340   GFRAA >90 12/13/2013 0340    Assessment and Plan  Hypothyroidism TSH in 03/2014 was 3.26 on 100 mcg daily;Plan-continue 100 mcg daily   Vitamin D deficiency Vit d 04/2014 was 36.82, barely in normal range;Plan reuced Vit D from 50,000 weekly to 50,000 monthly and repeat in 3 months   Vitamin B12 deficiency Pt getting replacement daily;Plan - check B12 level   Osteoporosis Pt on Vit D, now monthly and Oscal with D daily; no recent fractures; Plan- continue current meds   Depression Pt now on remeron 15 mg qHS and lexapro has been started and increased to 20 mg daily;Plan- continue these meds   Anxiety Pt is on both Buspar and ativan and plan continue both meds     Margit Hanks, MD

## 2014-05-12 NOTE — Assessment & Plan Note (Signed)
Pt now on remeron 15 mg qHS and lexapro has been started and increased to 20 mg daily;Plan- continue these meds

## 2014-05-12 NOTE — Assessment & Plan Note (Signed)
TSH in 03/2014 was 3.26 on 100 mcg daily;Plan-continue 100 mcg daily

## 2014-05-12 NOTE — Assessment & Plan Note (Signed)
Vit d 04/2014 was 36.82, barely in normal range;Plan reuced Vit D from 50,000 weekly to 50,000 monthly and repeat in 3 months

## 2014-05-12 NOTE — Assessment & Plan Note (Signed)
Pt getting replacement daily;Plan - check B12 level

## 2014-05-12 NOTE — Assessment & Plan Note (Signed)
Pt is on both Buspar and ativan and plan continue both meds

## 2014-05-12 NOTE — Assessment & Plan Note (Signed)
Pt on Vit D, now monthly and Oscal with D daily; no recent fractures; Plan- continue current meds

## 2014-06-01 ENCOUNTER — Encounter: Payer: Self-pay | Admitting: *Deleted

## 2014-06-20 ENCOUNTER — Non-Acute Institutional Stay (SKILLED_NURSING_FACILITY): Payer: Medicare Other | Admitting: Internal Medicine

## 2014-06-20 ENCOUNTER — Encounter: Payer: Self-pay | Admitting: Internal Medicine

## 2014-06-20 DIAGNOSIS — I5022 Chronic systolic (congestive) heart failure: Secondary | ICD-10-CM | POA: Diagnosis not present

## 2014-06-20 DIAGNOSIS — R627 Adult failure to thrive: Secondary | ICD-10-CM | POA: Insufficient documentation

## 2014-06-20 DIAGNOSIS — F0391 Unspecified dementia with behavioral disturbance: Secondary | ICD-10-CM

## 2014-06-20 DIAGNOSIS — F329 Major depressive disorder, single episode, unspecified: Secondary | ICD-10-CM | POA: Diagnosis not present

## 2014-06-20 DIAGNOSIS — F03918 Unspecified dementia, unspecified severity, with other behavioral disturbance: Secondary | ICD-10-CM

## 2014-06-20 DIAGNOSIS — F32A Depression, unspecified: Secondary | ICD-10-CM

## 2014-06-20 NOTE — Assessment & Plan Note (Signed)
Stable-no SOB, no peripheral edema.

## 2014-06-20 NOTE — Assessment & Plan Note (Addendum)
I think we have failure to thrive going on, hand in hand with depression. Former ETOH abuse, big marker for depression. Pt is not eating well, says she's hungry but it goes away when she tastes it then later gets a snack or shake because she gets hungry again. Prior labs, B12, Vit D, TSH, BMP, CBC all fine, ESR 9 in 03/2014. Pt has no c/o pain and no constitutional sx. Plan recheck all labs for initial survey-CBC, CMP , ESR, TSH, RA.

## 2014-06-20 NOTE — Progress Notes (Signed)
MRN: [id number] Name: Elizabeth [name]  Sex: female Age: 81 y.o. DOB: 1923-03-26  [place] #: [facility] Facility/Room: 203D Level Of Care: SNF Provider: Inocencio Homes D Emergency Contacts: Extended Emergency Contact Information Primary Emergency Contact: [name],[name] Address: [place], [place] [address] Johnnette Litter of [place] Phone: [phone] Mobile Phone: [phone] Relation: Nephew Secondary Emergency Contact: [name],[name] Address: [place], [place] [address] [place] Relation: Niece  Code Status: DNR  Allergies: Review of patient's allergies indicates no known allergies.  Chief Complaint  Patient presents with  . Acute Visit    HPI: Patient is 81 y.o. female who nursing asked me to see for FTT vs other cause of decline.  Past Medical History  Diagnosis Date  . Thyroid disease   . Abnormality of gait 04/21/2008    Qualifier: Diagnosis of  By: Walker Kehr MD, Patrick Jupiter    . ALCOHOL ABUSE 04/21/2008    Qualifier: Diagnosis of  By: Walker Kehr MD, Patrick Jupiter    . ANEMIA, SECONDARY TO ACUTE BLOOD LOSS 04/21/2008    Qualifier: Diagnosis of  By: Walker Kehr MD, Patrick Jupiter    . DEPRESSION/ANXIETY 04/21/2008    Qualifier: Diagnosis of  By: Walker Kehr MD, Patrick Jupiter    . UTI 04/21/2008    Qualifier: Diagnosis of  By: Walker Kehr MD, Patrick Jupiter    . CLOSED FRACTURE INTERTROCHANTERIC SECTION FEMUR 04/21/2008    Qualifier: Diagnosis of  By: Walker Kehr MD, Patrick Jupiter    . OSTEOARTHRITIS, KNEE, LEFT, MILD 04/21/2008    Qualifier: Diagnosis of  By: Walker Kehr MD, Patrick Jupiter    . OSTEOPOROSIS 04/21/2008    Qualifier: Diagnosis of  By: Walker Kehr MD, Patrick Jupiter    . HYPOTHYROIDISM 04/21/2008    Qualifier: Diagnosis of  By: Walker Kehr MD, Patrick Jupiter    . GALLBLADDER DISEASE 05/22/2008    Qualifier: Diagnosis of  By: Burnett Kanaris    . Acute CHF (congestive heart failure) 01/21/2013  . Essential hypertension, benign 04/21/2008    Qualifier: Diagnosis of  By: Walker Kehr MD, Patrick Jupiter    . LBBB 05/23/2008   Qualifier: Diagnosis of  By: Johnsie Cancel, MD, Rona Ravens   . Hyperlipidemia     Past Surgical History  Procedure Laterality Date  . Cholecystectomy    . Femur im nail  09/09/2011    Procedure: INTRAMEDULLARY (IM) NAIL FEMORAL;  Surgeon: Mcarthur Rossetti, MD;  Location: [facility];  Service: Orthopedics;  Laterality: Left;  distal interlocking screws      Medication List       This list is accurate as of: 06/20/14  1:27 PM.  Always use your most recent med list.               busPIRone 5 MG tablet  Commonly known as:  BUSPAR  Take 5 mg by mouth 2 (two) times daily. For anxiety     calcium-vitamin D 500-200 MG-UNIT per tablet  Commonly known as:  OSCAL WITH D  Take 2 tablets by mouth 2 (two) times daily.     escitalopram 20 MG tablet  Commonly known as:  LEXAPRO  Take 20 mg by mouth daily.     furosemide 20 MG tablet  Commonly known as:  LASIX  Take 20 mg by mouth 2 (two) times daily.     ibuprofen 200 MG tablet  Commonly known as:  ADVIL,MOTRIN  Take 800 mg by mouth every 8 (  eight) hours as needed (pain).     levothyroxine 100 MCG tablet  Commonly known as:  SYNTHROID, LEVOTHROID  Take 100 mcg by mouth daily before breakfast.     lipase/protease/amylase 12000 UNITS Cpep capsule  Commonly known as:  CREON  Take 2 capsules by mouth 3 (three) times daily before meals. And take 12000 units with snacks     LORazepam 0.5 MG tablet  Commonly known as:  ATIVAN  Take 1/2 tablet by mouth twice daily for anxiety     metoprolol tartrate 12.5 mg Tabs tablet  Commonly known as:  LOPRESSOR  Take 0.5 tablets (12.5 mg total) by mouth 2 (two) times daily.     mirtazapine 15 MG tablet  Commonly known as:  REMERON  Take 15 mg by mouth at bedtime.     polyethylene glycol packet  Commonly known as:  MIRALAX / GLYCOLAX  Take 17 g by mouth 2 (two) times daily.     potassium chloride 10 MEQ tablet  Commonly known as:  K-DUR,KLOR-CON  Take 10 mEq by mouth daily.      promethazine 25 MG tablet  Commonly known as:  PHENERGAN  Take 25 mg by mouth every 6 (six) hours as needed for nausea or vomiting.     senna 8.6 MG Tabs tablet  Commonly known as:  SENOKOT  Take 1 tablet (8.6 mg total) by mouth daily.     traMADol 50 MG tablet  Commonly known as:  ULTRAM  Take one tablet by mouth every 6 hours as needed for pain     Vitamin D (Ergocalciferol) 50000 UNITS Caps capsule  Commonly known as:  DRISDOL  Take 50,000 Units by mouth every 30 (thirty) days.        No orders of the defined types were placed in this encounter.    Immunization History  Administered Date(s) Administered  . Influenza,inj,Quad PF,36+ Mos 01/22/2013  . PPD Test 12/14/2013    History  Substance Use Topics  . Smoking status: Never Smoker   . Smokeless tobacco: Current User    Types: Snuff  . Alcohol Use: No    Review of Systems  DATA OBTAINED: from patient, nurse GENERAL:  no fevers, fatigue, decreased appetite  SKIN: No itching, rash HEENT: No complaint RESPIRATORY: No cough, wheezing, SOB CARDIAC: No chest pain, palpitations, lower extremity edema  GI: No abdominal pain, No N/V/D or constipation, No heartburn or reflux  GU: No dysuria, frequency or urgency, or incontinence  MUSCULOSKELETAL: No unrelieved bone/joint pain NEUROLOGIC: No headache, dizziness  PSYCHIATRIC: feels sad and lonely  Filed Vitals:   06/20/14 1306  BP: 110/65  Pulse: 70  Temp: 97 F (36.1 C)  Resp: 18    Physical Exam  GENERAL APPEARANCE: Alert, conversant, No acute distress  SKIN: No diaphoresis rash HEENT: Unremarkable RESPIRATORY: Breathing is even, unlabored. Lung sounds are clear   CARDIOVASCULAR: Heart RRR no murmurs, rubs or gallops. No peripheral edema  GASTROINTESTINAL: Abdomen is soft, non-tender, not distended w/ normal bowel sounds.  GENITOURINARY: Bladder non tender, not distended  MUSCULOSKELETAL: No abnormal joints or musculature NEUROLOGIC: Cranial nerves 2-12  grossly intact. Moves all extremities PSYCHIATRIC: flat affect, no behavioral issues  Patient Active Problem List   Diagnosis Date Noted  . FTT (failure to thrive) in adult 06/20/2014  . Vitamin D deficiency 05/12/2014  . Vitamin B12 deficiency 05/12/2014  . Chronic respiratory failure 01/04/2014  . Acute on chronic respiratory failure with hypoxia 12/18/2013  . Syncope 12/18/2013  .  Dementia with behavioral disturbance 12/18/2013  . Acute on chronic systolic congestive heart failure 12/09/2013  . Acute on chronic systolic CHF (congestive heart failure) 12/09/2013  . CHF (congestive heart failure) 12/08/2013  . Chronic systolic heart failure 03/16/1733  . Anxiety 08/25/2013  . Compression fracture of lumbar vertebra, non-traumatic 08/03/2013  . Fracture of lumbar spine 08/03/2013  . Pancreatic insufficiency 03/17/2013  . Ileus 02/21/2013  . Diarrhea 02/21/2013  . Nausea vomiting and diarrhea 02/19/2013  . GERD (gastroesophageal reflux disease) 02/19/2013  . Hypokalemia 02/01/2013  . Acute CHF (congestive heart failure) 01/21/2013  . Closed fracture of left distal femur 09/09/2011    Class: Acute  . EDEMA 05/23/2008  . GALLBLADDER DISEASE 05/22/2008  . Hypothyroidism 04/21/2008  . Depression 04/21/2008  . ALCOHOL ABUSE 04/21/2008  . Essential hypertension, benign 04/21/2008  . UTI 04/21/2008  . OSTEOARTHRITIS, KNEE, LEFT, MILD 04/21/2008  . Osteoporosis 04/21/2008  . ABNORMALITY OF GAIT 04/21/2008  . PELVIC PAIN, RIGHT 04/21/2008  . CLOSED FRACTURE INTERTROCHANTERIC SECTION FEMUR 04/21/2008    CBC    Component Value Date/Time   WBC 5.3 12/09/2013 0533   RBC 3.58* 12/09/2013 0533   HGB 11.2* 12/09/2013 0533   HCT 34.1* 12/09/2013 0533   PLT 129* 12/09/2013 0533   MCV 95.3 12/09/2013 0533   LYMPHSABS 0.9 12/09/2013 0533   MONOABS 0.4 12/09/2013 0533   EOSABS 0.0 12/09/2013 0533   BASOSABS 0.0 12/09/2013 0533    CMP     Component Value Date/Time   NA 137  12/13/2013 0340   K 5.5* 12/13/2013 0340   CL 96 12/13/2013 0340   CO2 28 12/13/2013 0340   GLUCOSE 93 12/13/2013 0340   BUN 14 12/13/2013 0340   CREATININE 0.52 12/13/2013 0340   CALCIUM 8.7 12/13/2013 0340   PROT 5.3* 12/09/2013 0533   ALBUMIN 3.0* 12/09/2013 0533   AST 17 12/09/2013 0533   ALT 10 12/09/2013 0533   ALKPHOS 86 12/09/2013 0533   BILITOT 0.5 12/09/2013 0533   GFRNONAA 83* 12/13/2013 0340   GFRAA >90 12/13/2013 0340    Assessment and Plan  FTT (failure to thrive) in adult I think we have failure to thrive going on, hand in hand with depression. Former ETOH abuse, big marker for depression. Pt is not eating well, says she's hungry but it goes away when she tastes it then later gets a snack or shake because she gets hungry again. Prior labs, B12, Vit D, TSH, BMP, CBC all fine, ESR 9 in 03/2014. Pt has no c/o pain and no constitutional sx. Plan recheck all labs for initial survey-CBC, CMP , ESR, TSH, RA.   Depression Definitely depressed and admits lonley. One of 12 children, only 2 left and she couldn't go live with sister. Some fear of dying whih we discussed. Pt on remeron 15 and lexapro 20 mg.   Dementia with behavioral disturbance i don't think this is a factor in FTT, I don't see her MS as declining.   Chronic systolic heart failure Stable-no SOB, no peripheral edema.   Time  spent >35 min Hennie Duos, MD

## 2014-06-20 NOTE — Assessment & Plan Note (Signed)
i don't think this is a factor in FTT, I don't see her MS as declining.

## 2014-06-20 NOTE — Assessment & Plan Note (Signed)
Definitely depressed and admits lonley. One of 12 children, only 2 left and she couldn't go live with sister. Some fear of dying whih we discussed. Pt on remeron 15 and lexapro 20 mg.

## 2014-07-13 ENCOUNTER — Non-Acute Institutional Stay (SKILLED_NURSING_FACILITY): Payer: Medicare Other | Admitting: Internal Medicine

## 2014-07-13 ENCOUNTER — Encounter: Payer: Self-pay | Admitting: Internal Medicine

## 2014-07-13 DIAGNOSIS — F0391 Unspecified dementia with behavioral disturbance: Secondary | ICD-10-CM | POA: Diagnosis not present

## 2014-07-13 DIAGNOSIS — F03918 Unspecified dementia, unspecified severity, with other behavioral disturbance: Secondary | ICD-10-CM

## 2014-07-13 DIAGNOSIS — I5022 Chronic systolic (congestive) heart failure: Secondary | ICD-10-CM

## 2014-07-13 DIAGNOSIS — F32A Depression, unspecified: Secondary | ICD-10-CM

## 2014-07-13 DIAGNOSIS — R627 Adult failure to thrive: Secondary | ICD-10-CM

## 2014-07-13 DIAGNOSIS — F329 Major depressive disorder, single episode, unspecified: Secondary | ICD-10-CM

## 2014-07-13 NOTE — Progress Notes (Signed)
Patient ID: Elizabeth [name], female   DOB: Aug 27, 1923, 81 y.o.   MRN: [id number] MRN: [id number] Name: Elizabeth [name]  Sex: female Age: 81 y.o. DOB: 01-12-1924  [name] Facility/Room: 203D Level Of Care: SNF Provider: [name] Emergency Contacts: Extended Emergency Contact Information Primary Emergency Contact: Elizabeth [name] Address: [address]          [place], [place] [address] [place]arden [place] of [place] Home Phone: [phone] Mobile Phone: [phone] Relation: Nephew Secondary Emergency Contact: [name],Elizabeth Address: [address] [place], [place] [address] [name] of [place] Relation: Niece  Code Status: DNR  Allergies: Review of patient's allergies indicates no known allergies.  Chief Complaint  Patient presents with  . Medical Management of Chronic Issues    HPI: Patient is 81 y.o. female seen for follow-up of chronic medical issues including failure to thriveCHF-depression-hypothyroidism  She appears to be stable-Dr. Lyn Hollingshead saw her last month and was concerned about weight loss but this appears to have stabilized appears to have possibly gained a couple pounds.  She is on supplements including ensure and Medpass.  Her vital signs are stable she does not complain of any dysphagia shortness of breath chest pain she appears to be in good spirits she does have a history of dementia but is pleasant and cooperative with exam this evening.    Past Medical History  Diagnosis Date  . Thyroid disease   . Abnormality of gait 04/21/2008    Qualifier: Diagnosis of  By: Sheffield Slider MD, Deniece Portela    . ALCOHOL ABUSE 04/21/2008    Qualifier: Diagnosis of  By: Sheffield Slider MD, Deniece Portela    . ANEMIA, SECONDARY TO ACUTE BLOOD LOSS 04/21/2008    Qualifier: Diagnosis of  By: Sheffield Slider MD, Deniece Portela    . DEPRESSION/ANXIETY 04/21/2008    Qualifier: Diagnosis of  By: Sheffield Slider MD, Deniece Portela    . UTI 04/21/2008    Qualifier: Diagnosis of  By: Sheffield Slider MD, Deniece Portela    . CLOSED  FRACTURE INTERTROCHANTERIC SECTION FEMUR 04/21/2008    Qualifier: Diagnosis of  By: Sheffield Slider MD, Deniece Portela    . OSTEOARTHRITIS, KNEE, LEFT, MILD 04/21/2008    Qualifier: Diagnosis of  By: Sheffield Slider MD, Deniece Portela    . OSTEOPOROSIS 04/21/2008    Qualifier: Diagnosis of  By: Sheffield Slider MD, Deniece Portela    . HYPOTHYROIDISM 04/21/2008    Qualifier: Diagnosis of  By: Sheffield Slider MD, Deniece Portela    . GALLBLADDER DISEASE 05/22/2008    Qualifier: Diagnosis of  By: Kem Parkinson    . Acute CHF (congestive heart failure) 01/21/2013  . Essential hypertension, benign 04/21/2008    Qualifier: Diagnosis of  By: Sheffield Slider MD, Deniece Portela    . LBBB 05/23/2008    Qualifier: Diagnosis of  By: Eden Emms, MD, Harrington Challenger   . Hyperlipidemia     Past Surgical History  Procedure Laterality Date  . Cholecystectomy    . Femur im nail  09/09/2011    Procedure: INTRAMEDULLARY (IM) NAIL FEMORAL;  Surgeon: Kathryne Hitch, MD;  Location: [facility];  Service: Orthopedics;  Laterality: Left;  distal interlocking screws      Medication List       This list is accurate as of: 07/13/14  4:46 PM.  Always use your most recent med list.               busPIRone 5 MG tablet  Commonly known as:  BUSPAR  Take 5 mg by  mouth 2 (two) times daily. For anxiety     calcium-vitamin D 500-200 MG-UNIT per tablet  Commonly known as:  OSCAL WITH D  Take 2 tablets by mouth 2 (two) times daily.     escitalopram 20 MG tablet  Commonly known as:  LEXAPRO  Take 20 mg by mouth daily.     furosemide 20 MG tablet  Commonly known as:  LASIX  Take 20 mg by mouth 2 (two) times daily.     ibuprofen 200 MG tablet  Commonly known as:  ADVIL,MOTRIN  Take 800 mg by mouth every 8 (eight) hours as needed (pain).     levothyroxine 100 MCG tablet  Commonly known as:  SYNTHROID, LEVOTHROID  Take 75 mcg by mouth daily before breakfast.     lipase/protease/amylase 84166 UNITS Cpep capsule  Commonly known as:  CREON  Take 2 capsules by mouth 3 (three) times daily before meals. And  take 06301 units with snacks     LORazepam 0.5 MG tablet  Commonly known as:  ATIVAN  Take 1/2 tablet by mouth twice daily for anxiety     metoprolol tartrate 12.5 mg Tabs tablet  Commonly known as:  LOPRESSOR  Take 0.5 tablets (12.5 mg total) by mouth 2 (two) times daily.     mirtazapine 15 MG tablet  Commonly known as:  REMERON  Take 15 mg by mouth at bedtime.     polyethylene glycol packet  Commonly known as:  MIRALAX / GLYCOLAX  Take 17 g by mouth 2 (two) times daily.     potassium chloride 10 MEQ tablet  Commonly known as:  K-DUR,KLOR-CON  Take 10 mEq by mouth daily.     promethazine 25 MG tablet  Commonly known as:  PHENERGAN  Take 25 mg by mouth every 6 (six) hours as needed for nausea or vomiting.     senna 8.6 MG Tabs tablet  Commonly known as:  SENOKOT  Take 1 tablet (8.6 mg total) by mouth daily.     traMADol 50 MG tablet  Commonly known as:  ULTRAM  Take one tablet by mouth every 6 hours as needed for pain     Vitamin D (Ergocalciferol) 50000 UNITS Caps capsule  Commonly known as:  DRISDOL  Take 50,000 Units by mouth every 30 (thirty) days.        No orders of the defined types were placed in this encounter.    Immunization History  Administered Date(s) Administered  . Influenza,inj,Quad PF,36+ Mos 01/22/2013  . PPD Test 12/14/2013    History  Substance Use Topics  . Smoking status: Never Smoker   . Smokeless tobacco: Current User    Types: Snuff  . Alcohol Use: No    Review of Systems  DATA OBTAINED: from patient, nurse GENERAL:  no fevers, fatigue, decreased appetite --says she eats okay appear she actually has gained a couple pounds SKIN: No itching, rash HEENT: No complaint RESPIRATORY: No cough, wheezing, SOB CARDIAC: No chest pain, palpitations, lower extremity edema  GI: No abdominal pain, No N/V/D or constipation, No heartburn or reflux  GU: No dysuria, frequency or urgency, or incontinence  MUSCULOSKELETAL: No unrelieved  bone/joint pain NEUROLOGIC: No headache, dizziness  PSYCHIATRIC: Appears to be good spirits smiling alert cooperative  Filed Vitals:   07/13/14 1644  BP: 111/70  Pulse: 76  Temp: 97.8 F (36.6 C)  Resp: 20    Physical Exam  GENERAL APPEARANCE: Alert, conversant, No acute distress  SKIN: No diaphoresis rash HEENT:  Oropharynx is clear mucous membranes moist RESPIRATORY: Breathing is even, unlabored. Lung sounds are clear   CARDIOVASCULAR: Heart RRR no murmurs, rubs or gallops. No peripheral edema  GASTROINTESTINAL: Abdomen is soft, non-tender, not distended w/ normal bowel sounds.  GENITOURINARY: Bladder non tender, not distended  MUSCULOSKELETAL: No abnormal joints or musculature will frailty NEUROLOGIC: Cranial nerves 2-12 grossly intact. Moves all extremities PSYCHIATRIC: Pleasant smiling, no behavioral issues  Patient Active Problem List   Diagnosis Date Noted  . FTT (failure to thrive) in adult 06/20/2014  . Vitamin D deficiency 05/12/2014  . Vitamin B12 deficiency 05/12/2014  . Chronic respiratory failure 01/04/2014  . Acute on chronic respiratory failure with hypoxia 12/18/2013  . Syncope 12/18/2013  . Dementia with behavioral disturbance 12/18/2013  . Acute on chronic systolic congestive heart failure 12/09/2013  . Acute on chronic systolic CHF (congestive heart failure) 12/09/2013  . CHF (congestive heart failure) 12/08/2013  . Chronic systolic heart failure 08/25/2013  . Anxiety 08/25/2013  . Compression fracture of lumbar vertebra, non-traumatic 08/03/2013  . Fracture of lumbar spine 08/03/2013  . Pancreatic insufficiency 03/17/2013  . Ileus 02/21/2013  . Diarrhea 02/21/2013  . Nausea vomiting and diarrhea 02/19/2013  . GERD (gastroesophageal reflux disease) 02/19/2013  . Hypokalemia 02/01/2013  . Acute CHF (congestive heart failure) 01/21/2013  . Closed fracture of left distal femur 09/09/2011    Class: Acute  . EDEMA 05/23/2008  . GALLBLADDER DISEASE  05/22/2008  . Hypothyroidism 04/21/2008  . Depression 04/21/2008  . ALCOHOL ABUSE 04/21/2008  . Essential hypertension, benign 04/21/2008  . UTI 04/21/2008  . OSTEOARTHRITIS, KNEE, LEFT, MILD 04/21/2008  . Osteoporosis 04/21/2008  . ABNORMALITY OF GAIT 04/21/2008  . PELVIC PAIN, RIGHT 04/21/2008  . CLOSED FRACTURE INTERTROCHANTERIC SECTION FEMUR 04/21/2008     Labs.  07/12/2014.  WBC 6.3 hemoglobin 9.7 platelets 151.  Sodium 133 potassium 4 BUN 40 creatinine 1.1 CBC    Component Value Date/Time   WBC 5.3 12/09/2013 0533   RBC 3.58* 12/09/2013 0533   HGB 11.2* 12/09/2013 0533   HCT 34.1* 12/09/2013 0533   PLT 129* 12/09/2013 0533   MCV 95.3 12/09/2013 0533   LYMPHSABS 0.9 12/09/2013 0533   MONOABS 0.4 12/09/2013 0533   EOSABS 0.0 12/09/2013 0533   BASOSABS 0.0 12/09/2013 0533    CMP     Component Value Date/Time   NA 137 12/13/2013 0340   K 5.5* 12/13/2013 0340   CL 96 12/13/2013 0340   CO2 28 12/13/2013 0340   GLUCOSE 93 12/13/2013 0340   BUN 14 12/13/2013 0340   CREATININE 0.52 12/13/2013 0340   CALCIUM 8.7 12/13/2013 0340   PROT 5.3* 12/09/2013 0533   ALBUMIN 3.0* 12/09/2013 0533   AST 17 12/09/2013 0533   ALT 10 12/09/2013 0533   ALKPHOS 86 12/09/2013 0533   BILITOT 0.5 12/09/2013 0533   GFRNONAA 83* 12/13/2013 0340   GFRAA >90 12/13/2013 0340    Assessment and Plan     FTT (failure to thrive) in adult  She appears to be doing a bit better in this regards her weight has stabilized possibly gained a small amount she is on supplements-she appears to be happier person than what she presented last month with Dr. Rochel Brome is followed by psychiatric services she is on Lexapro as well as Wellbutrin and Remeron-at this point continue to monitor her weights are being followed closely   Depression  On Wellbutrin Lexapro and Remeron as noted above she appears to be doing somewhat better in this  regards.   Dementia with behavioral disturbance i  don't think this is a factor in FTT, I don't see her MS as declining.   Chronic systolic heart failure Stable-no SOB, no peripheral edema.  continues on Lasix with potassium supplementation as well as a beta blocker--I note her BUN and creatinine have risen slightly--will need to update this next week if continues to rise we may have to reduce her Lasix.  Hypothyroidism I do note TSH was low on May 18 at 0.168-her Synthroid dose has been reduced update TSH is pending.  Also will update a CBC CMP.  I do note her sedimentation rate last month was within normal limits at 26.  Clinically she appears to be doing a bit better but is a fragile individual.--   [id number]

## 2014-07-23 ENCOUNTER — Encounter (HOSPITAL_COMMUNITY): Payer: Self-pay | Admitting: *Deleted

## 2014-07-23 ENCOUNTER — Inpatient Hospital Stay (HOSPITAL_COMMUNITY): Payer: Medicare Other

## 2014-07-23 ENCOUNTER — Emergency Department (HOSPITAL_COMMUNITY): Payer: Medicare Other

## 2014-07-23 ENCOUNTER — Inpatient Hospital Stay (HOSPITAL_COMMUNITY)
Admission: EM | Admit: 2014-07-23 | Discharge: 2014-08-04 | DRG: 189 | Disposition: E | Payer: Medicare Other | Attending: Emergency Medicine | Admitting: Emergency Medicine

## 2014-07-23 DIAGNOSIS — J9601 Acute respiratory failure with hypoxia: Secondary | ICD-10-CM

## 2014-07-23 DIAGNOSIS — Z720 Tobacco use: Secondary | ICD-10-CM

## 2014-07-23 DIAGNOSIS — F419 Anxiety disorder, unspecified: Secondary | ICD-10-CM | POA: Diagnosis present

## 2014-07-23 DIAGNOSIS — M1712 Unilateral primary osteoarthritis, left knee: Secondary | ICD-10-CM | POA: Diagnosis present

## 2014-07-23 DIAGNOSIS — Z87440 Personal history of urinary (tract) infections: Secondary | ICD-10-CM | POA: Diagnosis not present

## 2014-07-23 DIAGNOSIS — I447 Left bundle-branch block, unspecified: Secondary | ICD-10-CM | POA: Diagnosis present

## 2014-07-23 DIAGNOSIS — E785 Hyperlipidemia, unspecified: Secondary | ICD-10-CM | POA: Diagnosis present

## 2014-07-23 DIAGNOSIS — Z79899 Other long term (current) drug therapy: Secondary | ICD-10-CM

## 2014-07-23 DIAGNOSIS — F329 Major depressive disorder, single episode, unspecified: Secondary | ICD-10-CM | POA: Diagnosis present

## 2014-07-23 DIAGNOSIS — Z9049 Acquired absence of other specified parts of digestive tract: Secondary | ICD-10-CM | POA: Diagnosis not present

## 2014-07-23 DIAGNOSIS — Z8249 Family history of ischemic heart disease and other diseases of the circulatory system: Secondary | ICD-10-CM

## 2014-07-23 DIAGNOSIS — R0603 Acute respiratory distress: Secondary | ICD-10-CM | POA: Diagnosis present

## 2014-07-23 DIAGNOSIS — I509 Heart failure, unspecified: Secondary | ICD-10-CM | POA: Diagnosis present

## 2014-07-23 DIAGNOSIS — E872 Acidosis, unspecified: Secondary | ICD-10-CM

## 2014-07-23 DIAGNOSIS — M810 Age-related osteoporosis without current pathological fracture: Secondary | ICD-10-CM | POA: Diagnosis present

## 2014-07-23 DIAGNOSIS — E039 Hypothyroidism, unspecified: Secondary | ICD-10-CM | POA: Diagnosis present

## 2014-07-23 DIAGNOSIS — Z515 Encounter for palliative care: Secondary | ICD-10-CM | POA: Diagnosis not present

## 2014-07-23 DIAGNOSIS — R112 Nausea with vomiting, unspecified: Secondary | ICD-10-CM | POA: Diagnosis not present

## 2014-07-23 DIAGNOSIS — R627 Adult failure to thrive: Secondary | ICD-10-CM | POA: Diagnosis present

## 2014-07-23 LAB — COMPREHENSIVE METABOLIC PANEL
ALK PHOS: 70 U/L (ref 38–126)
ALT: 12 U/L — AB (ref 14–54)
AST: 21 U/L (ref 15–41)
Albumin: 3.4 g/dL — ABNORMAL LOW (ref 3.5–5.0)
Anion gap: 14 (ref 5–15)
BUN: 52 mg/dL — ABNORMAL HIGH (ref 6–20)
CALCIUM: 8.3 mg/dL — AB (ref 8.9–10.3)
CHLORIDE: 109 mmol/L (ref 101–111)
CO2: 12 mmol/L — AB (ref 22–32)
Creatinine, Ser: 1.54 mg/dL — ABNORMAL HIGH (ref 0.44–1.00)
GFR calc Af Amer: 33 mL/min — ABNORMAL LOW (ref >60)
GFR calc non Af Amer: 29 mL/min — ABNORMAL LOW (ref >60)
Glucose, Bld: 279 mg/dL — ABNORMAL HIGH (ref 65–99)
Potassium: 4.7 mmol/L (ref 3.5–5.1)
Sodium: 135 mmol/L (ref 135–145)
TOTAL PROTEIN: 6.2 g/dL — AB (ref 6.5–8.1)
Total Bilirubin: 0.3 mg/dL (ref 0.3–1.2)

## 2014-07-23 LAB — CBC WITH DIFFERENTIAL/PLATELET
Basophils Absolute: 0 10*3/uL (ref 0.0–0.1)
Basophils Relative: 0 % (ref 0–1)
Eosinophils Absolute: 0 10*3/uL (ref 0.0–0.7)
Eosinophils Relative: 0 % (ref 0–5)
HCT: 44.1 % (ref 36.0–46.0)
Hemoglobin: 13.8 g/dL (ref 12.0–15.0)
Lymphocytes Relative: 12 % (ref 12–46)
Lymphs Abs: 2.9 10*3/uL (ref 0.7–4.0)
MCH: 30.9 pg (ref 26.0–34.0)
MCHC: 31.3 g/dL (ref 30.0–36.0)
MCV: 98.7 fL (ref 78.0–100.0)
MONOS PCT: 4 % (ref 3–12)
Monocytes Absolute: 1 10*3/uL (ref 0.1–1.0)
NEUTROS PCT: 84 % — AB (ref 43–77)
Neutro Abs: 20.3 10*3/uL — ABNORMAL HIGH (ref 1.7–7.7)
Platelets: 316 10*3/uL (ref 150–400)
RBC: 4.47 MIL/uL (ref 3.87–5.11)
RDW: 16.2 % — ABNORMAL HIGH (ref 11.5–15.5)
WBC: 24.2 10*3/uL — ABNORMAL HIGH (ref 4.0–10.5)

## 2014-07-23 LAB — URINALYSIS, ROUTINE W REFLEX MICROSCOPIC
Bilirubin Urine: NEGATIVE
GLUCOSE, UA: NEGATIVE mg/dL
Ketones, ur: NEGATIVE mg/dL
Nitrite: POSITIVE — AB
PROTEIN: 30 mg/dL — AB
Specific Gravity, Urine: 1.014 (ref 1.005–1.030)
UROBILINOGEN UA: 0.2 mg/dL (ref 0.0–1.0)
pH: 5.5 (ref 5.0–8.0)

## 2014-07-23 LAB — URINE MICROSCOPIC-ADD ON

## 2014-07-23 LAB — I-STAT CG4 LACTIC ACID, ED: Lactic Acid, Venous: 9.2 mmol/L (ref 0.5–2.0)

## 2014-07-23 LAB — I-STAT TROPONIN, ED: TROPONIN I, POC: 0.02 ng/mL (ref 0.00–0.08)

## 2014-07-23 MED ORDER — SODIUM CHLORIDE 0.9 % IV BOLUS (SEPSIS)
1000.0000 mL | Freq: Once | INTRAVENOUS | Status: AC
  Administered 2014-07-23: 1000 mL via INTRAVENOUS

## 2014-07-23 MED ORDER — ONDANSETRON HCL 4 MG/2ML IJ SOLN: 4.0000 mg | Freq: Once | INTRAMUSCULAR | Status: DC

## 2014-07-23 MED ORDER — PIPERACILLIN-TAZOBACTAM 3.375 G IVPB 30 MIN: 3.3750 g | Freq: Once | INTRAVENOUS | Status: DC

## 2014-07-23 MED ORDER — NALOXONE HCL 0.4 MG/ML IJ SOLN
0.4000 mg | Freq: Once | INTRAMUSCULAR | Status: AC
  Administered 2014-07-23: 0.4 mg via INTRAVENOUS
  Filled 2014-07-23: qty 1

## 2014-07-23 MED ORDER — IOHEXOL 300 MG/ML  SOLN: 50.0000 mL | Freq: Once | INTRAMUSCULAR | Status: DC | PRN

## 2014-07-24 NOTE — ED Notes (Signed)
i was in patient chart to print face sheet to give to [facility]...klj

## 2014-07-27 NOTE — Progress Notes (Signed)
Utilization review completed.  

## 2014-08-04 NOTE — ED Notes (Signed)
Weak radial pulses bilaterally and moderate brachial pulse.

## 2014-08-04 NOTE — ED Notes (Signed)
Pt is more alert now,  She is moaning and has actually pushed cover off her right arm,  Pt continues to be comfort measures

## 2014-08-04 NOTE — ED Notes (Signed)
Report called to [facility] at [facility] [phone] she is aware as per our conversation that pt is for comfort measures only and DNR stands as per family discussion with Dr Littie Deeds

## 2014-08-04 NOTE — ED Notes (Signed)
Ems called to facility for nausea vomiting,  When they arrived pt had rhonchi in lower lobes and went unresponsive , with agonal respirations,  EMS did assisted ventilation then tried bipap pt didn't tolerate,  Pt arrived being ventilated,  Pt is gray and unknown time of agonal breathing.

## 2014-08-04 NOTE — ED Notes (Signed)
Pt repositioned and warm blankets provided. Comfort Care initiated.

## 2014-08-04 NOTE — ED Notes (Signed)
Bed: RESB Expected date:  Expected time:  Means of arrival:  Comments: EMS resp distress, assisted ventilation

## 2014-08-04 NOTE — ED Notes (Signed)
Pt noted to be without Resp and Heart beat. Family at bedside

## 2014-08-04 NOTE — ED Notes (Signed)
Bed: WA13 Expected date:  Expected time:  Means of arrival:  Comments: Res B 

## 2014-08-04 NOTE — ED Notes (Signed)
Hold everything until family gets here per Dr Littie Deeds,  Family is going to make decision about DNR status

## 2014-08-04 NOTE — Progress Notes (Signed)
Chaplain paged at [phone] and notified that the patient, Ms Elysha Muha. Vanvickle was dying. Ms Permann had died when the chaplain arrived 12 minutes later. Present in the room was Ms shaynah terzian who for several years was her primary care provider and house-mate. Only recently had she moved into a care facility, after she developed a loss of memory and became paranoid. Her grandnephew visited her daily. She died from a heart problem that she had been previously diagnosed and treated for.   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 Her work was her life, she leaves no children or spouse.  [employer] was chosen as their funeral home.  Family wishes to thank the staff of the ED of [facility] for their great care, and thank the members of the [facility] for their support in recent years.  Benjie Karvonen. Mayetta Castleman, DMin Chaplain

## 2014-08-04 NOTE — ED Notes (Signed)
[facility] notified. Spoke with Pam, case is closed due to age. Case # [id number]

## 2014-08-04 NOTE — ED Provider Notes (Signed)
CSN: [id number]     Arrival date & time 07-26-14  0347 History   First MD Initiated Contact with Patient 26-Jul-2014 [phone]     Chief Complaint  Patient presents with  . Respiratory Distress     (Consider location/radiation/quality/duration/timing/severity/associated sxs/prior Treatment) Patient is a 81 y.o. female presenting with vomiting.  Emesis Severity:  Moderate Duration:  1 day Timing:  Intermittent Quality:  Stomach contents Progression:  Unchanged Chronicity:  New Associated symptoms: no diarrhea and no fever    Level 5 caveat for altered mental status  Past Medical History  Diagnosis Date  . Thyroid disease   . Abnormality of gait 04/21/2008    Qualifier: Diagnosis of  By: Sheffield Slider MD, Deniece Portela    . ALCOHOL ABUSE 04/21/2008    Qualifier: Diagnosis of  By: Sheffield Slider MD, Deniece Portela    . ANEMIA, SECONDARY TO ACUTE BLOOD LOSS 04/21/2008    Qualifier: Diagnosis of  By: Sheffield Slider MD, Deniece Portela    . DEPRESSION/ANXIETY 04/21/2008    Qualifier: Diagnosis of  By: Sheffield Slider MD, Deniece Portela    . UTI 04/21/2008    Qualifier: Diagnosis of  By: Sheffield Slider MD, Deniece Portela    . CLOSED FRACTURE INTERTROCHANTERIC SECTION FEMUR 04/21/2008    Qualifier: Diagnosis of  By: Sheffield Slider MD, Deniece Portela    . OSTEOARTHRITIS, KNEE, LEFT, MILD 04/21/2008    Qualifier: Diagnosis of  By: Sheffield Slider MD, Deniece Portela    . OSTEOPOROSIS 04/21/2008    Qualifier: Diagnosis of  By: Sheffield Slider MD, Deniece Portela    . HYPOTHYROIDISM 04/21/2008    Qualifier: Diagnosis of  By: Sheffield Slider MD, Deniece Portela    . GALLBLADDER DISEASE 05/22/2008    Qualifier: Diagnosis of  By: Kem Parkinson    . Acute CHF (congestive heart failure) 01/21/2013  . Essential hypertension, benign 04/21/2008    Qualifier: Diagnosis of  By: Sheffield Slider MD, Deniece Portela    . LBBB 05/23/2008    Qualifier: Diagnosis of  By: Eden Emms, MD, Harrington Challenger   . Hyperlipidemia    Past Surgical History  Procedure Laterality Date  . Cholecystectomy    . Femur im nail  09/09/2011    Procedure: INTRAMEDULLARY (IM) NAIL FEMORAL;  Surgeon: Kathryne Hitch, MD;  Location: [facility] OR;  Service: Orthopedics;  Laterality: Left;  distal interlocking screws   Family History  Problem Relation Age of Onset  . Heart disease Father   . Heart disease Sister    History  Substance Use Topics  . Smoking status: Never Smoker   . Smokeless tobacco: Current User    Types: Snuff  . Alcohol Use: No   OB History    No data available     Review of Systems  Unable to perform ROS: Mental status change  Gastrointestinal: Positive for vomiting. Negative for diarrhea.      Allergies  Review of patient's allergies indicates no known allergies.  Home Medications   Prior to Admission medications   Medication Sig Start Date End Date Taking? Authorizing Provider  ALPHAGAN P 0.1 % SOLN Place 1 drop into both eyes 2 (two) times daily.  05/15/14  Yes Historical Provider, MD  buPROPion (WELLBUTRIN SR) 100 MG 12 hr tablet Take 1 tablet by mouth daily. 07/19/14  Yes Historical Provider, MD  busPIRone (BUSPAR) 5 MG tablet Take 5 mg by mouth 2 (two) times daily. For anxiety   Yes Historical Provider, MD  calcium-vitamin D (OSCAL WITH D) 500-200 MG-UNIT per tablet Take 2 tablets by mouth 2 (two) times daily. 08/08/13  Yes Prashant  Curlene Labrum, MD  escitalopram (LEXAPRO) 20 MG tablet Take 20 mg by mouth daily.   Yes Historical Provider, MD  furosemide (LASIX) 20 MG tablet Take 20 mg by mouth 2 (two) times daily.    Yes Historical Provider, MD  levothyroxine (SYNTHROID, LEVOTHROID) 100 MCG tablet Take 75 mcg by mouth daily before breakfast.    Yes Historical Provider, MD  lipase/protease/amylase (CREON-12/PANCREASE) 12000 UNITS CPEP capsule Take 2 capsules by mouth 3 (three) times daily before meals. And take 16109 units with snacks 03/17/13  Yes Sharee Holster, NP  LORazepam (ATIVAN) 0.5 MG tablet Take 1/2 tablet by mouth twice daily for anxiety 01/30/14  Yes Sharon Seller, NP  metoprolol tartrate (LOPRESSOR) 12.5 mg TABS tablet Take 0.5 tablets (12.5 mg total) by  mouth 2 (two) times daily. 01/25/13  Yes [name], MD  mirtazapine (REMERON) 15 MG tablet Take 7.5 mg by mouth at bedtime.    Yes Historical Provider, MD  polyethylene glycol (MIRALAX / GLYCOLAX) packet Take 17 g by mouth 2 (two) times daily. 08/08/13  Yes Leroy Sea, MD  potassium chloride (K-DUR,KLOR-CON) 10 MEQ tablet Take 10 mEq by mouth daily.   Yes Historical Provider, MD  promethazine (PHENERGAN) 25 MG tablet Take 25 mg by mouth every 6 (six) hours as needed for nausea or vomiting.   Yes Historical Provider, MD  senna (SENOKOT) 8.6 MG TABS tablet Take 1 tablet (8.6 mg total) by mouth daily. 12/14/13  Yes Clydia Llano, MD  traMADol (ULTRAM) 50 MG tablet Take one tablet by mouth every 6 hours as needed for pain 12/15/13  Yes Tiffany L Reed, DO  Vitamin D, Ergocalciferol, (DRISDOL) 50000 UNITS CAPS capsule Take 50,000 Units by mouth every 30 (thirty) days.    Yes Historical Provider, MD   BP 83/46 mmHg  Pulse 87  Temp(Src) 97.2 F (36.2 C) (Rectal)  Resp 22  SpO2 83% Physical Exam  Constitutional: She appears well-developed and well-nourished.  HENT:  Head: Normocephalic and atraumatic.  Right Ear: External ear normal.  Left Ear: External ear normal.  Eyes: Conjunctivae and EOM are normal. Pupils are equal, round, and reactive to light.  Neck: Normal range of motion. Neck supple.  Cardiovascular: Normal rate, regular rhythm, normal heart sounds and intact distal pulses.   Pulmonary/Chest: Breath sounds normal. She is in respiratory distress (agonal respirations).  Abdominal: Soft. Bowel sounds are normal. There is no tenderness.  Musculoskeletal: Normal range of motion.  Neurological: She is unresponsive.  Skin: Skin is warm and dry.  Vitals reviewed.   ED Course  Procedures (including critical care time) Labs Review Labs Reviewed  CBC WITH DIFFERENTIAL/PLATELET - Abnormal; Notable for the following:    WBC 24.2 (*)    RDW 16.2 (*)    Neutrophils Relative % 84  (*)    Neutro Abs 20.3 (*)    All other components within normal limits  COMPREHENSIVE METABOLIC PANEL - Abnormal; Notable for the following:    CO2 12 (*)    Glucose, Bld 279 (*)    BUN 52 (*)    Creatinine, Ser 1.54 (*)    Calcium 8.3 (*)    Total Protein 6.2 (*)    Albumin 3.4 (*)    ALT 12 (*)    GFR calc non Af Amer 29 (*)    GFR calc Af Amer 33 (*)    All other components within normal limits  URINALYSIS, ROUTINE W REFLEX MICROSCOPIC (NOT AT [facility]) - Abnormal; Notable for  the following:    APPearance CLOUDY (*)    Hgb urine dipstick SMALL (*)    Protein, ur 30 (*)    Nitrite POSITIVE (*)    Leukocytes, UA SMALL (*)    All other components within normal limits  URINE MICROSCOPIC-ADD ON - Abnormal; Notable for the following:    Squamous Epithelial / LPF FEW (*)    Bacteria, UA MANY (*)    All other components within normal limits  I-STAT CG4 LACTIC ACID, ED - Abnormal; Notable for the following:    Lactic Acid, Venous 9.20 (*)    All other components within normal limits  I-STAT TROPOININ, ED  I-STAT CG4 LACTIC ACID, ED    Imaging Review Dg Chest Portable 1 View  08-03-2014   CLINICAL DATA:  Nausea and vomiting. Patient had rhonchi and lower lobes and went unresponsive with back mental respirations.  EXAM: PORTABLE CHEST - 1 VIEW  COMPARISON:  12/10/2013  FINDINGS: Normal heart size and pulmonary vascularity. Emphysematous changes in the lungs. Perihilar infiltration may represent edema or consolidation. No blunting of costophrenic angles. No pneumothorax. Calcified and tortuous aorta. Degenerative changes in the spine. Gas-filled bowel in the upper abdomen could indicate obstruction or ileus. Esophageal hiatal hernia behind the heart.  IMPRESSION: Emphysematous changes in the lungs with perihilar airspace disease. Gas-filled bowel in the abdomen could indicate ileus or obstruction.   Electronically Signed   By: Burman Nieves M.D.   On: August 03, 2014 04:52     EKG  Interpretation   Date/Time:  Sunday August 03, 2014 03:52:29 EDT Ventricular Rate:  94 PR Interval:    QRS Duration: 158 QT Interval:  424 QTC Calculation: 530 R Axis:   15 Text Interpretation:  Atrial fibrillation Left bundle branch block  Inferior infarct, acute (RCA) Probable RV involvement, suggest recording  right precordial leads Confirmed by Mirian Mo [phone]) on Aug 03, 2014  3:59:46 AM     CRITICAL CARE Performed by: Mirian Mo   Total critical care time: 35 min  Critical care time was exclusive of separately billable procedures and treating other patients.  Critical care was necessary to treat or prevent imminent or life-threatening deterioration.  Critical care was time spent personally by me on the following activities: development of treatment plan with patient and/or surrogate as well as nursing, discussions with consultants, evaluation of patient's response to treatment, examination of patient, obtaining history from patient or surrogate, ordering and performing treatments and interventions, ordering and review of laboratory studies, ordering and review of radiographic studies, pulse oximetry and re-evaluation of patient's condition.  MDM   Final diagnoses:  Acute respiratory distress  FTT (failure to thrive) in adult  Acute respiratory failure with hypoxia  Lactic acidosis    81 y.o. female with pertinent PMH of failure to thrive, CHF  presents with initial complaint of nausea and vomiting, however enroute via EMS pt began to have respiratory distress unresponsive to duoneb, began to have agonal respirations.  Pt DNR with signed form with paperwork.  On arrival I verified this with nursing home staff who stated that the pt was comfort care only.  Wu with lactic acidosis, likely ileus on XR.    Son arrived.  He agreed with comfort care measures.  I informed him that pt was critically ill at this time and we discussed utility of further imaging or therapy.  We  agreed that the pt was unlikely to benefit from further testing or therapy.  A short time after this conversation the  pt became apneic, had asystolic arrest, and expired peacefully.   I spoke with [place] senior care physician on call who agreed to fill out death certificate.  TOD 0820.    I have reviewed all laboratory and imaging studies if ordered as above  1. Acute respiratory distress   2. FTT (failure to thrive) in adult   3. Acute respiratory failure with hypoxia   4. Lactic acidosis         Mirian Mo, MD 08/06/2014 [phone]

## 2014-08-04 DEATH — deceased

## 2015-11-05 IMAGING — CR DG ABDOMEN ACUTE W/ 1V CHEST
3 series · 3 of 3 positions shown · non-contrast
Comparison: 08/07/2013.

CLINICAL DATA: Abdominal distention.

EXAM:
ACUTE ABDOMEN SERIES (ABDOMEN 2 VIEW & CHEST 1 VIEW)

[t chest supine]
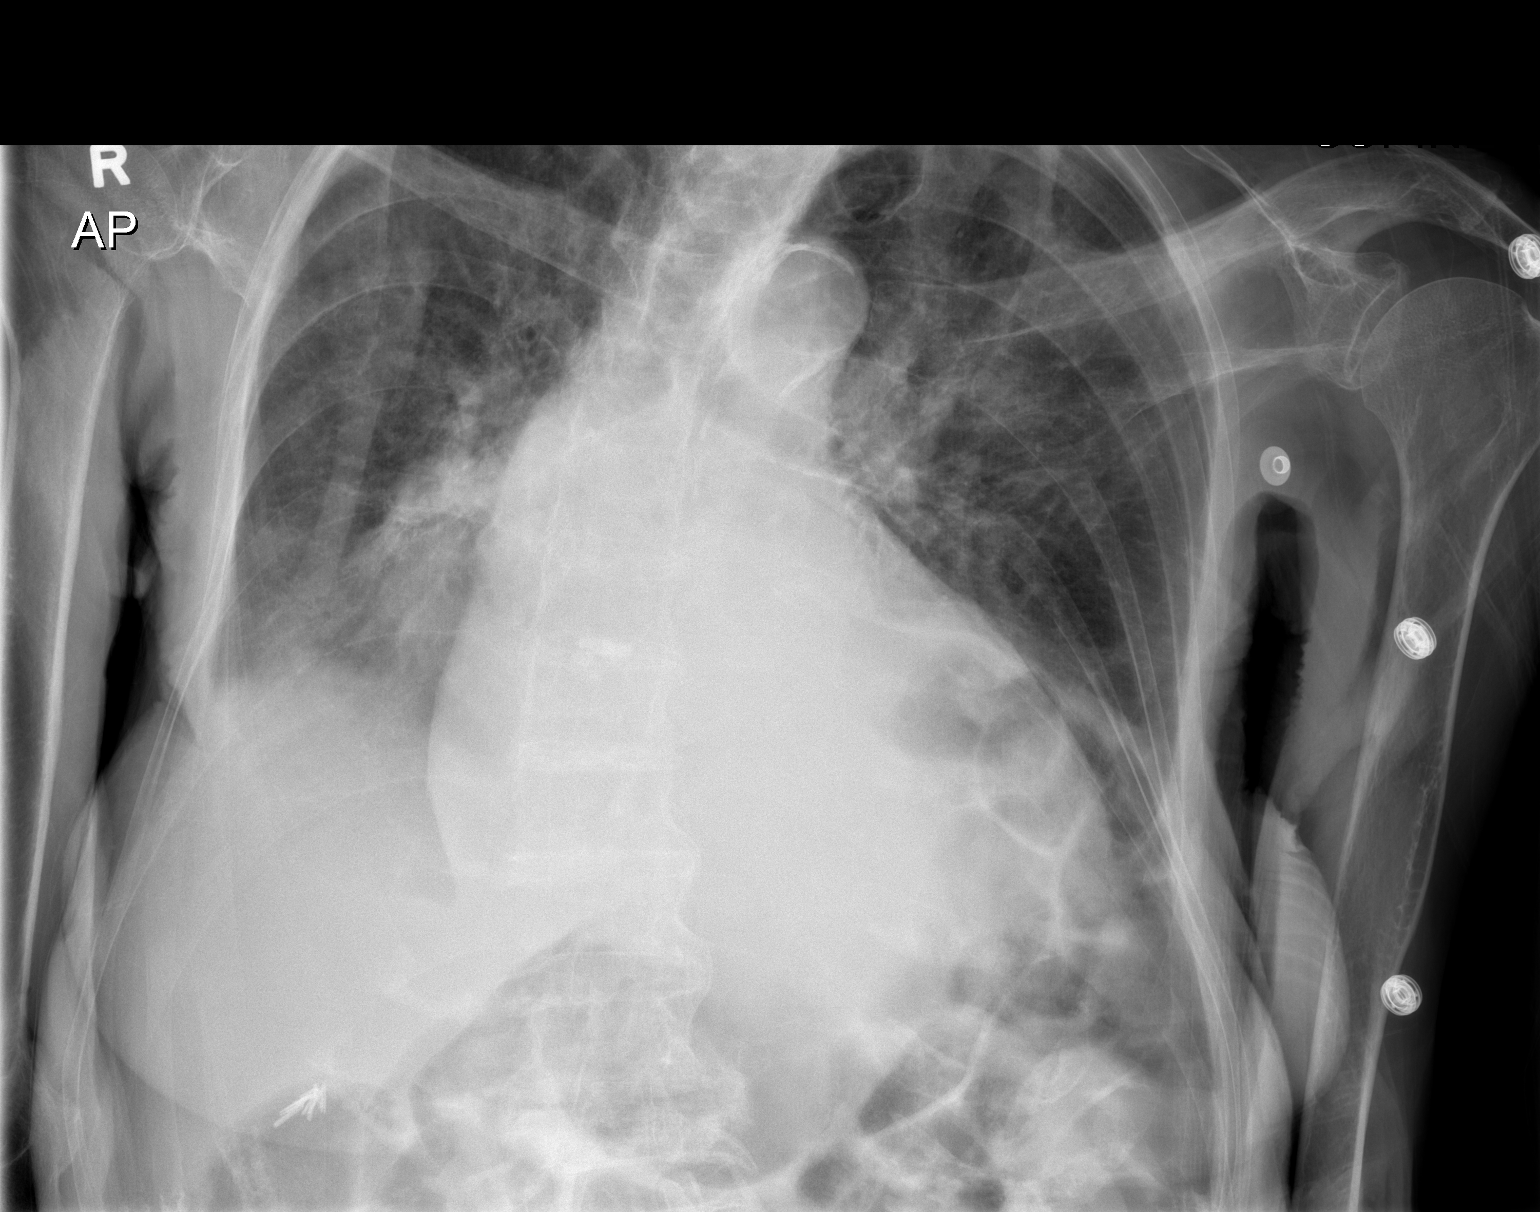

[t abdomen supine]
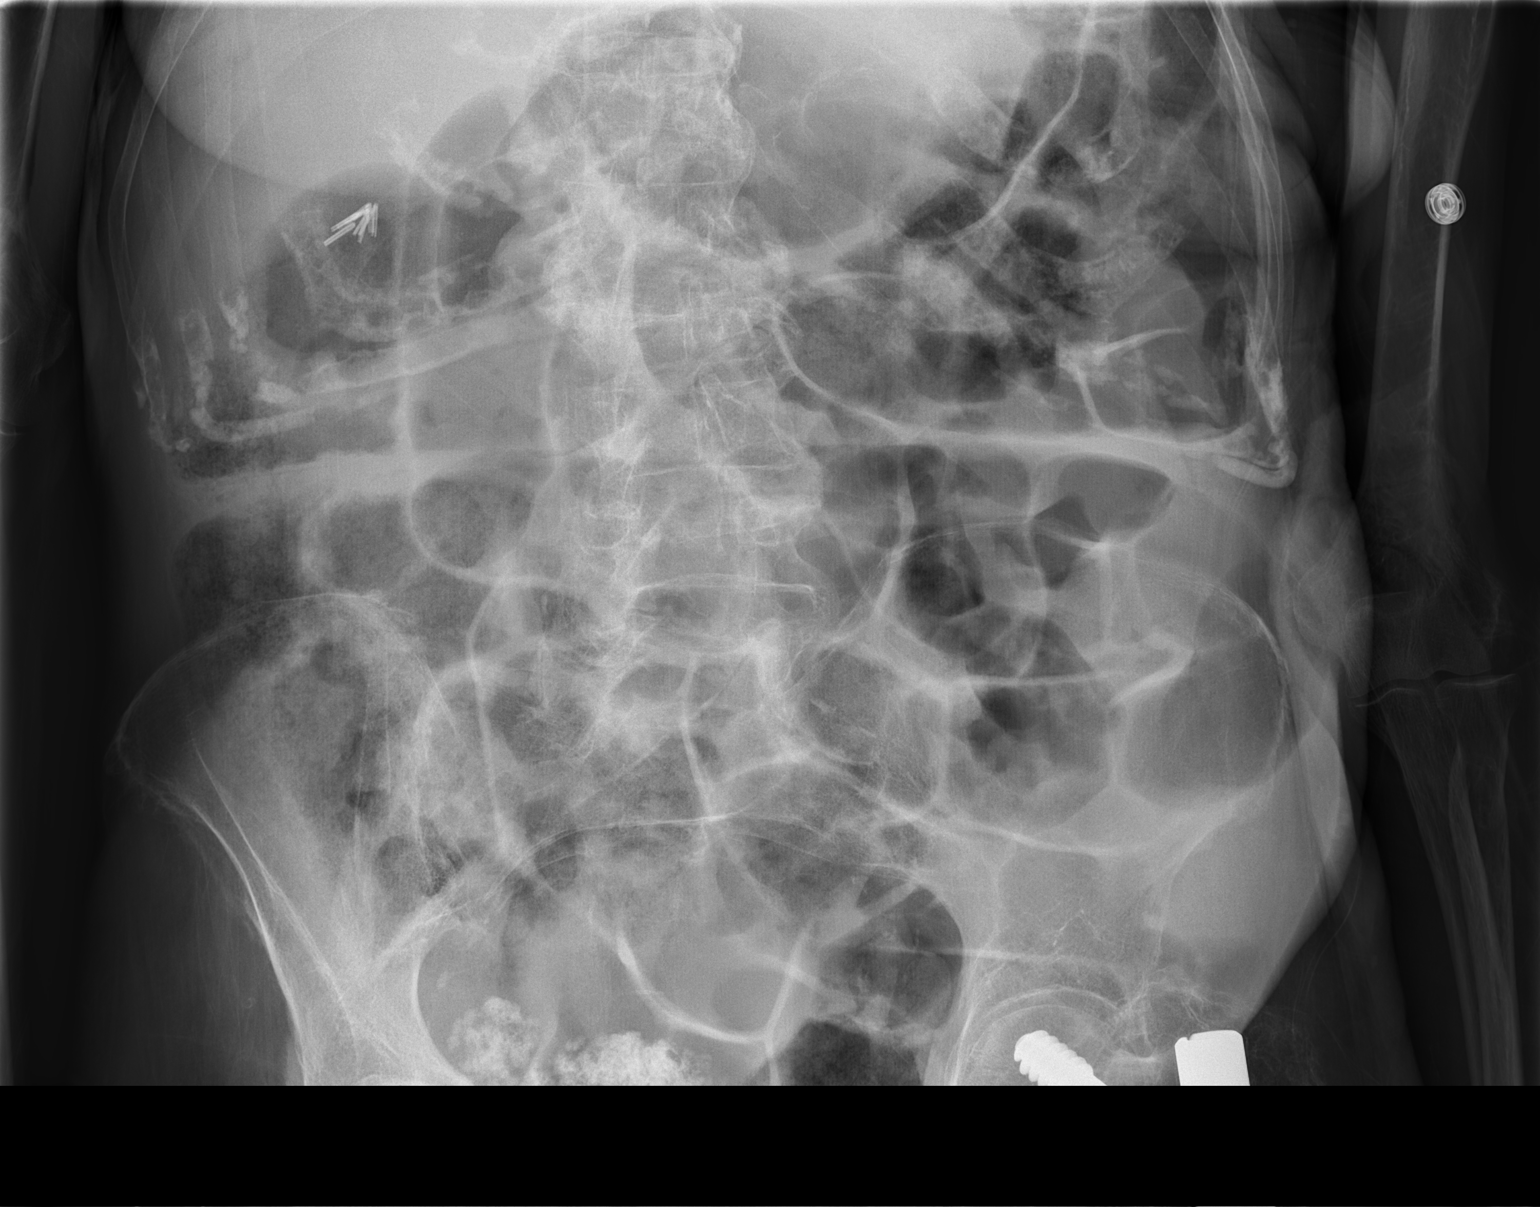

[x abdomen decub]
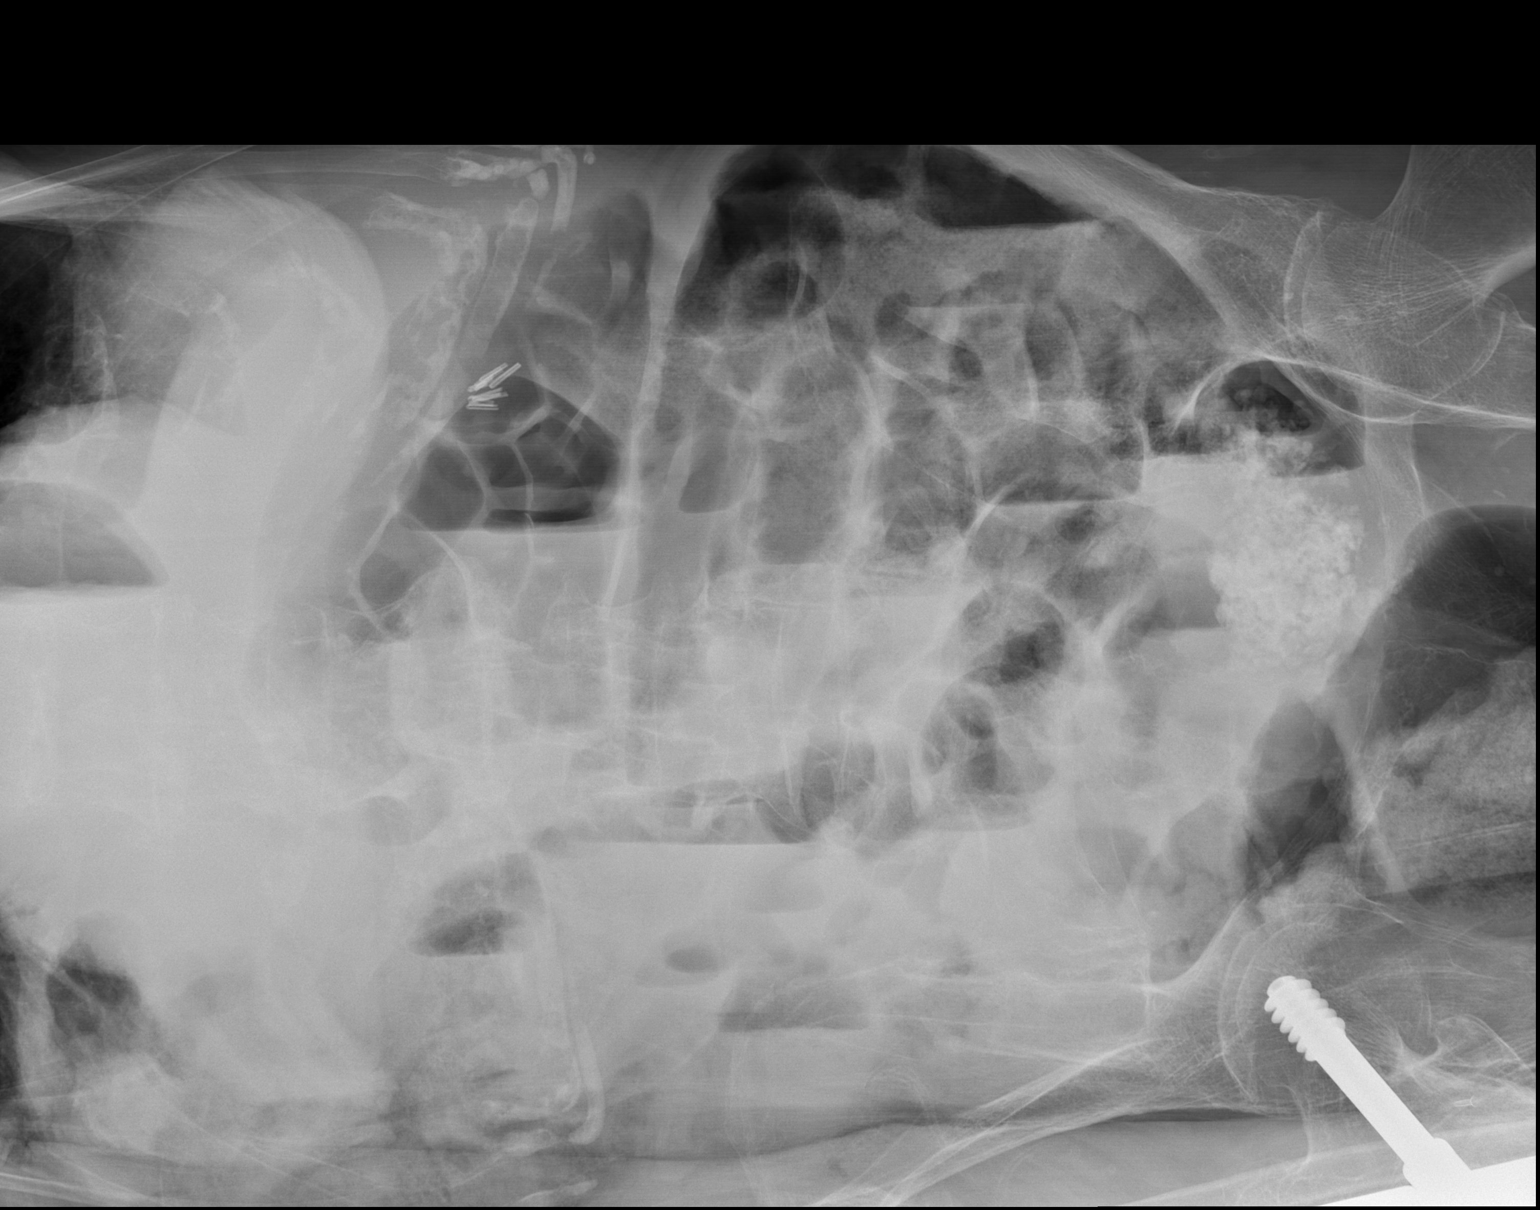

[3 of 3 positions shown; findings below may reference images not displayed]

FINDINGS: Diffuse increased bowel gas with no significant bowel distention.
There are air-fluid levels on the decubitus view. No free air.

Calcification in the pelvis is consistent with fibroids. This is
stable.

There has been a cholecystectomy and a ORIF of a left proximal femur
fracture.

Frontal chest radiograph shows perihilar and hazy lung base opacity,
improved from the study dated 12/08/2013. This is consistent with
improved pulmonary edema. Cardiac silhouette is mildly enlarged.
Small effusions are evident.
IMPRESSION: 1. Increased bowel gas diffusely, with multiple air-fluid levels.
This is most suggestive of an adynamic ileus. There is significant
bowel distention. No convincing obstruction. No free air.
2. Improved pulmonary edema since the prior chest radiograph.

## 2015-11-06 IMAGING — CR DG ABDOMEN 2V
1 series · 1 of 1 positions shown · non-contrast
Comparison: 12/10/2013

CLINICAL DATA: Ileus, diffuse abdominal pain and distension

EXAM:
ABDOMEN - 2 VIEW

[w abdomen decub *]
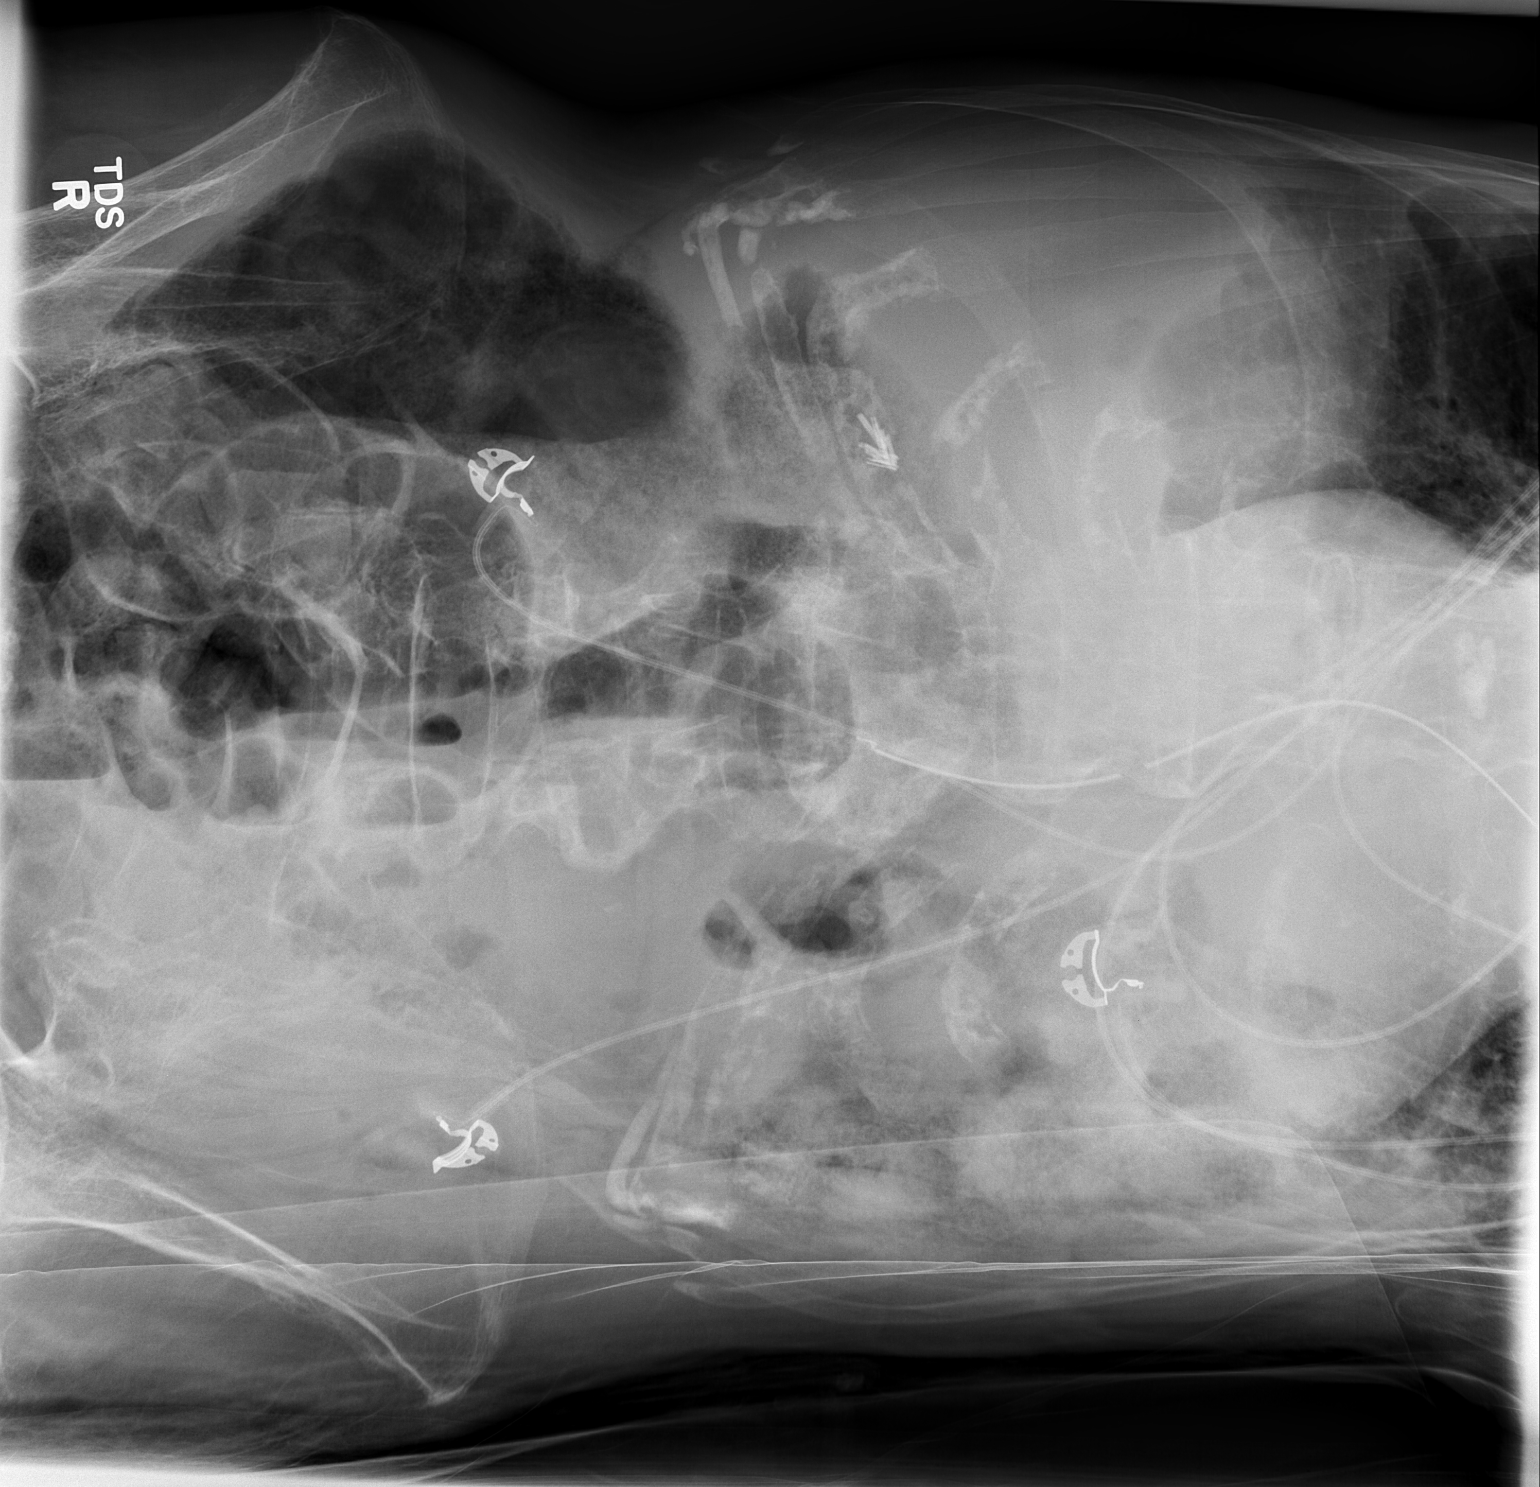

[1 of 1 positions shown; findings below may reference images not displayed]

FINDINGS: Nasogastric tube tip terminates below the level of the
hemidiaphragms but is not included in the field of view. Moderate
enlargement of the cardiomediastinal silhouette reidentified. Left
basilar atelectasis or consolidation re- noted. Multiple
differential air-fluid levels are identified with increased gaseous
distention of multiple loops of bowel predominantly over the lower
abdomen. Calcified presumed fibroid reidentified. Dynamic left
femoral nail again noted.
IMPRESSION: Increased gaseous bowel distention and a pattern typical for ileus.

## 2015-11-07 IMAGING — CR DG ABDOMEN 2V
3 series · 3 of 3 positions shown · non-contrast
Comparison: Earlier same day, 12/11/2013 and 12/10/2013.

CLINICAL DATA: Abdominal distention.

EXAM:
ABDOMEN - 2 VIEW

[w abdomen decub]
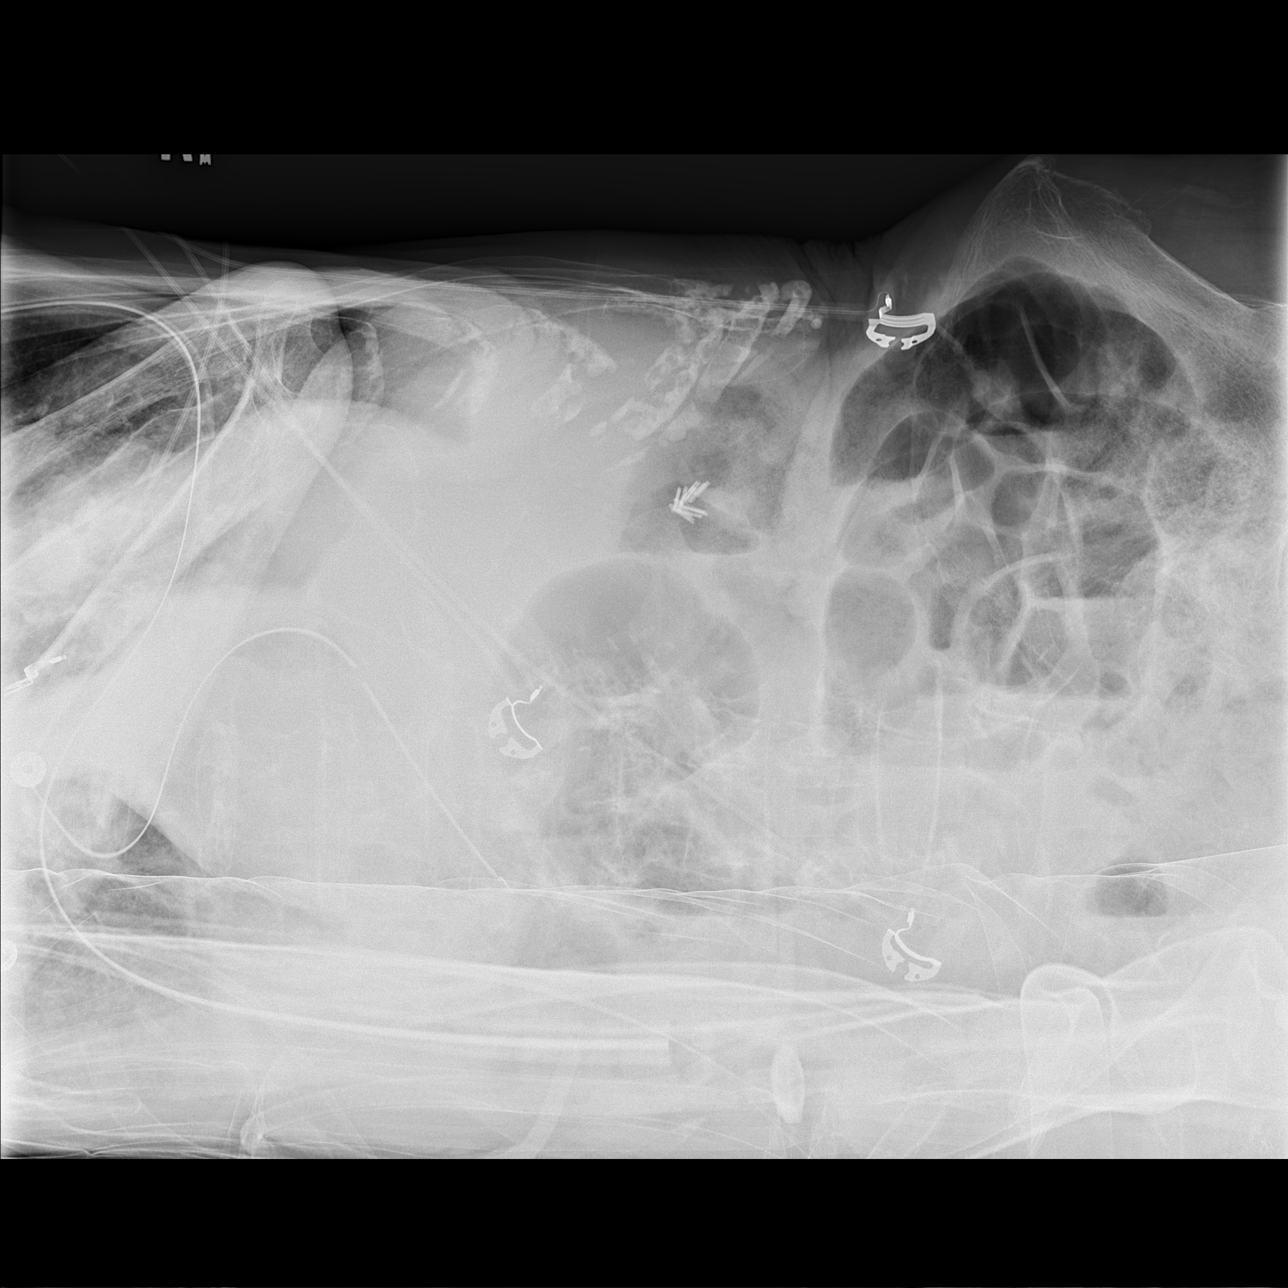

[x abdomen decub]
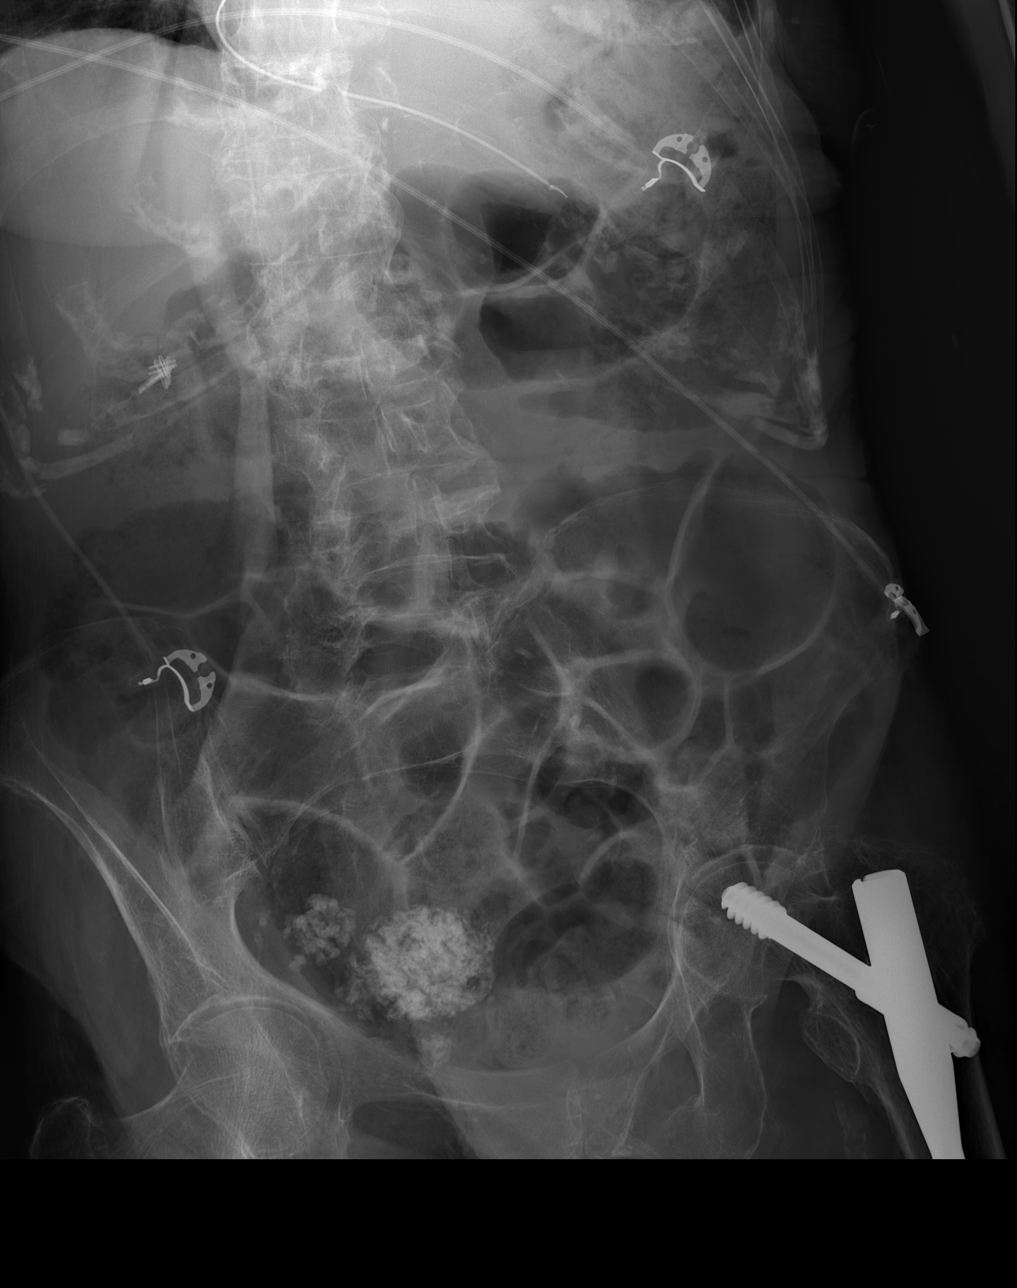

[x abdomen supine]
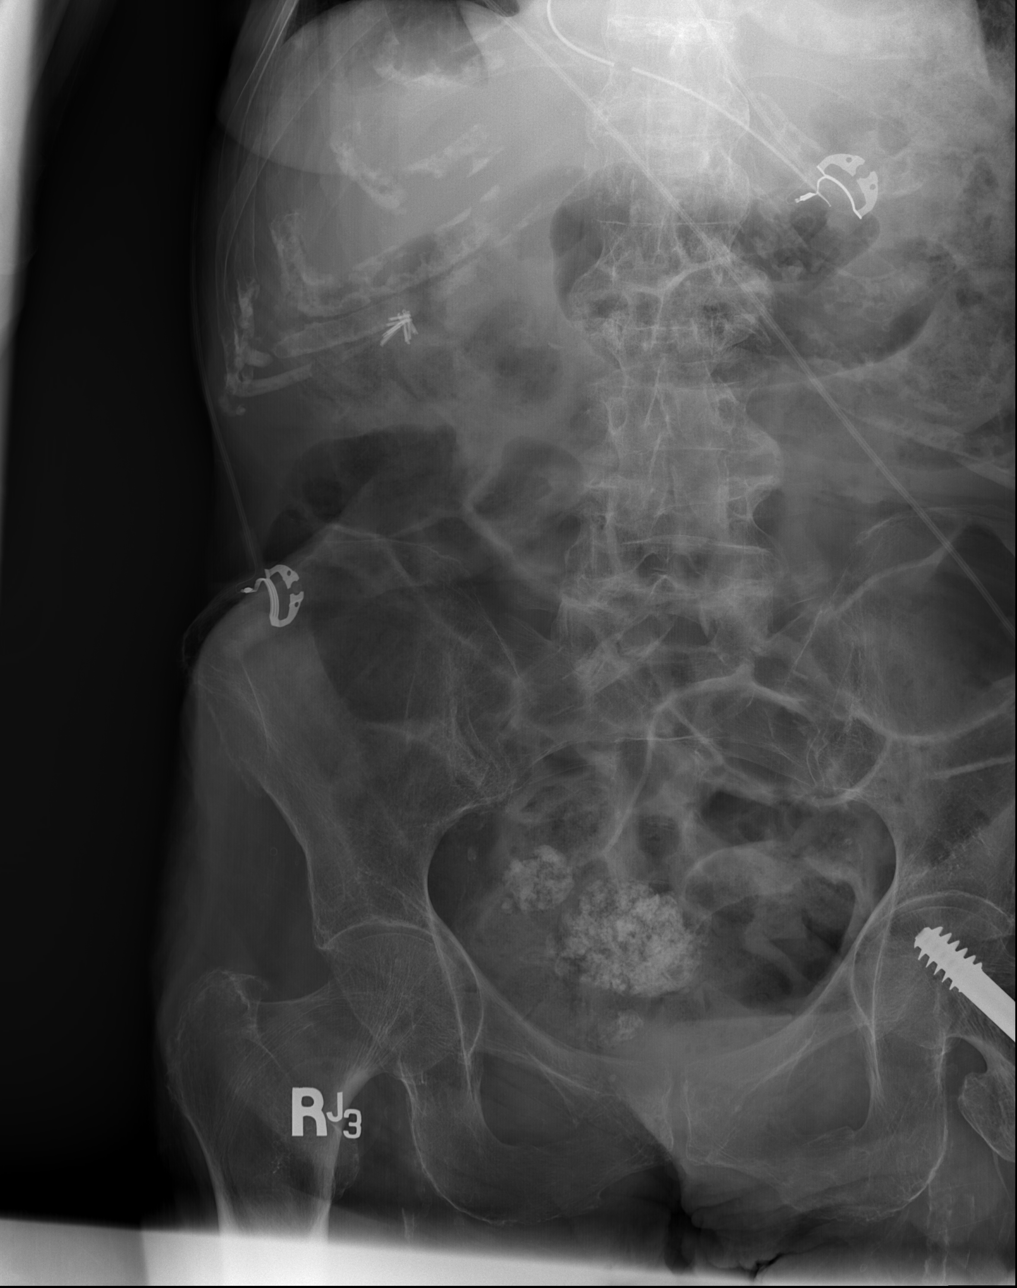

[3 of 3 positions shown; findings below may reference images not displayed]

FINDINGS: Nasogastric tube has tip overlying the expected region of the
stomach in the left upper quadrant with side port in the expected
region of the gastroesophageal junction. The exam again demonstrates
multiple air-filled loops of large small bowel as several of the
small bowel loops are dilated although not significantly changed
likely representing continued ileus. No evidence of free peritoneal
air. Remainder the exam is unchanged.
IMPRESSION: Continued air-filled loops of large a small with several dilated
small bowel loops unchanged likely due to continued ileus. No free
peritoneal air.

Nasogastric tube with tip over the expected region of the stomach in
the left upper quadrant and side-port in the expected region of the
gastroesophageal junction.
